# Patient Record
Sex: Male | Born: 1945 | Race: White | Hispanic: No | Marital: Married | Smoking: Never smoker
Health system: Southern US, Community
[De-identification: ages and names within clinical notes are randomized; demographics above are authoritative.]

## PROBLEM LIST (undated history)

## (undated) DIAGNOSIS — E119 Type 2 diabetes mellitus without complications: Secondary | ICD-10-CM

## (undated) DIAGNOSIS — I10 Essential (primary) hypertension: Secondary | ICD-10-CM

## (undated) DIAGNOSIS — I4891 Unspecified atrial fibrillation: Secondary | ICD-10-CM

## (undated) DIAGNOSIS — G4730 Sleep apnea, unspecified: Secondary | ICD-10-CM

## (undated) DIAGNOSIS — N419 Inflammatory disease of prostate, unspecified: Secondary | ICD-10-CM

## (undated) DIAGNOSIS — G4733 Obstructive sleep apnea (adult) (pediatric): Secondary | ICD-10-CM

## (undated) DIAGNOSIS — K635 Polyp of colon: Secondary | ICD-10-CM

## (undated) DIAGNOSIS — Z9989 Dependence on other enabling machines and devices: Secondary | ICD-10-CM

## (undated) DIAGNOSIS — K219 Gastro-esophageal reflux disease without esophagitis: Secondary | ICD-10-CM

## (undated) DIAGNOSIS — E785 Hyperlipidemia, unspecified: Secondary | ICD-10-CM

## (undated) HISTORY — DX: Essential (primary) hypertension: I10

## (undated) HISTORY — DX: Type 2 diabetes mellitus without complications: E11.9

## (undated) HISTORY — DX: Unspecified atrial fibrillation: I48.91

## (undated) HISTORY — DX: Sleep apnea, unspecified: G47.30

## (undated) HISTORY — DX: Inflammatory disease of prostate, unspecified: N41.9

## (undated) HISTORY — DX: Gastro-esophageal reflux disease without esophagitis: K21.9

## (undated) HISTORY — DX: Dependence on other enabling machines and devices: Z99.89

## (undated) HISTORY — DX: Obstructive sleep apnea (adult) (pediatric): G47.33

## (undated) HISTORY — DX: Hyperlipidemia, unspecified: E78.5

## (undated) HISTORY — DX: Polyp of colon: K63.5

---

## 1973-06-28 HISTORY — PX: HERNIA REPAIR: SHX51

## 1998-06-13 ENCOUNTER — Other Ambulatory Visit: Admission: RE | Admit: 1998-06-13 | Discharge: 1998-06-13 | Payer: Self-pay | Admitting: Unknown Physician Specialty

## 2004-05-04 ENCOUNTER — Ambulatory Visit: Payer: Self-pay | Admitting: Internal Medicine

## 2004-09-08 ENCOUNTER — Ambulatory Visit: Payer: Self-pay | Admitting: Endocrinology

## 2004-09-14 ENCOUNTER — Ambulatory Visit: Payer: Self-pay | Admitting: Internal Medicine

## 2004-10-26 ENCOUNTER — Ambulatory Visit: Payer: Self-pay | Admitting: Internal Medicine

## 2004-11-02 ENCOUNTER — Ambulatory Visit: Payer: Self-pay | Admitting: Internal Medicine

## 2005-03-25 ENCOUNTER — Ambulatory Visit: Payer: Self-pay | Admitting: Internal Medicine

## 2005-03-30 ENCOUNTER — Ambulatory Visit: Payer: Self-pay | Admitting: Internal Medicine

## 2005-08-02 ENCOUNTER — Ambulatory Visit: Payer: Self-pay | Admitting: Internal Medicine

## 2005-11-05 ENCOUNTER — Ambulatory Visit: Payer: Self-pay | Admitting: Internal Medicine

## 2005-11-26 ENCOUNTER — Ambulatory Visit: Payer: Self-pay | Admitting: Internal Medicine

## 2006-05-03 ENCOUNTER — Ambulatory Visit: Payer: Self-pay | Admitting: Internal Medicine

## 2006-05-03 LAB — CONVERTED CEMR LAB
Albumin: 3.9 g/dL (ref 3.5–5.2)
Alkaline Phosphatase: 107 units/L (ref 39–117)
Basophils Absolute: 0 10*3/uL (ref 0.0–0.1)
CO2: 34 meq/L — ABNORMAL HIGH (ref 19–32)
Glomerular Filtration Rate, Af Am: 73 mL/min/{1.73_m2}
Glucose, Bld: 109 mg/dL — ABNORMAL HIGH (ref 70–99)
Ketones, ur: NEGATIVE mg/dL
LDL Cholesterol: 102 mg/dL — ABNORMAL HIGH (ref 0–99)
Lymphocytes Relative: 20.7 % (ref 12.0–46.0)
Monocytes Relative: 7.7 % (ref 3.0–11.0)
Nitrite: NEGATIVE
Platelets: 236 10*3/uL (ref 150–400)
Potassium: 4 meq/L (ref 3.5–5.1)
Sodium: 141 meq/L (ref 135–145)
Specific Gravity, Urine: 1.025 (ref 1.000–1.03)
TSH: 2.33 microintl units/mL (ref 0.35–5.50)
Total Bilirubin: 0.8 mg/dL (ref 0.3–1.2)
Total Protein: 7 g/dL (ref 6.0–8.3)
Urobilinogen, UA: 0.2 (ref 0.0–1.0)
VLDL: 13 mg/dL (ref 0–40)
pH: 5.5 (ref 5.0–8.0)

## 2006-05-11 ENCOUNTER — Ambulatory Visit: Payer: Self-pay | Admitting: Internal Medicine

## 2006-06-28 HISTORY — PX: ROTATOR CUFF REPAIR: SHX139

## 2007-03-27 ENCOUNTER — Ambulatory Visit: Payer: Self-pay | Admitting: Internal Medicine

## 2007-04-22 ENCOUNTER — Encounter: Payer: Self-pay | Admitting: Internal Medicine

## 2007-04-22 DIAGNOSIS — I10 Essential (primary) hypertension: Secondary | ICD-10-CM

## 2007-04-22 DIAGNOSIS — E785 Hyperlipidemia, unspecified: Secondary | ICD-10-CM

## 2007-04-22 DIAGNOSIS — Z86010 Personal history of colonic polyps: Secondary | ICD-10-CM

## 2007-05-16 ENCOUNTER — Ambulatory Visit: Payer: Self-pay | Admitting: Internal Medicine

## 2007-05-16 LAB — CONVERTED CEMR LAB
AST: 39 units/L — ABNORMAL HIGH (ref 0–37)
Basophils Relative: 0.8 % (ref 0.0–1.0)
Bilirubin, Direct: 0.2 mg/dL (ref 0.0–0.3)
Chloride: 100 meq/L (ref 96–112)
Cholesterol: 183 mg/dL (ref 0–200)
Creatinine, Ser: 1.2 mg/dL (ref 0.4–1.5)
Eosinophils Relative: 2.1 % (ref 0.0–5.0)
Glucose, Bld: 113 mg/dL — ABNORMAL HIGH (ref 70–99)
HCT: 43.2 % (ref 39.0–52.0)
Ketones, ur: NEGATIVE mg/dL
LDL Cholesterol: 102 mg/dL — ABNORMAL HIGH (ref 0–99)
Neutrophils Relative %: 66.7 % (ref 43.0–77.0)
Nitrite: NEGATIVE
PSA: 1.94 ng/mL (ref 0.10–4.00)
RBC: 4.92 M/uL (ref 4.22–5.81)
RDW: 13.2 % (ref 11.5–14.6)
Sodium: 142 meq/L (ref 135–145)
Specific Gravity, Urine: >=1.03 (ref 1.000–1.03)
Total Bilirubin: 1.3 mg/dL — ABNORMAL HIGH (ref 0.3–1.2)
Urobilinogen, UA: 0.2 (ref 0.0–1.0)
VLDL: 28 mg/dL (ref 0–40)
WBC: 5.5 10*3/uL (ref 4.5–10.5)
pH: 6 (ref 5.0–8.0)

## 2007-05-23 ENCOUNTER — Ambulatory Visit: Payer: Self-pay | Admitting: Internal Medicine

## 2007-05-23 DIAGNOSIS — E876 Hypokalemia: Secondary | ICD-10-CM | POA: Insufficient documentation

## 2007-05-23 DIAGNOSIS — N529 Male erectile dysfunction, unspecified: Secondary | ICD-10-CM | POA: Insufficient documentation

## 2007-05-23 DIAGNOSIS — H60399 Other infective otitis externa, unspecified ear: Secondary | ICD-10-CM | POA: Insufficient documentation

## 2007-09-05 ENCOUNTER — Ambulatory Visit: Payer: Self-pay | Admitting: Gastroenterology

## 2007-09-13 ENCOUNTER — Ambulatory Visit: Payer: Self-pay | Admitting: Gastroenterology

## 2007-09-21 ENCOUNTER — Ambulatory Visit: Payer: Self-pay | Admitting: Pulmonary Disease

## 2007-09-21 DIAGNOSIS — G4733 Obstructive sleep apnea (adult) (pediatric): Secondary | ICD-10-CM | POA: Insufficient documentation

## 2007-09-26 ENCOUNTER — Ambulatory Visit: Payer: Self-pay | Admitting: Internal Medicine

## 2007-09-26 DIAGNOSIS — K219 Gastro-esophageal reflux disease without esophagitis: Secondary | ICD-10-CM | POA: Insufficient documentation

## 2007-10-04 ENCOUNTER — Ambulatory Visit: Payer: Self-pay | Admitting: Pulmonary Disease

## 2007-10-04 ENCOUNTER — Ambulatory Visit (HOSPITAL_BASED_OUTPATIENT_CLINIC_OR_DEPARTMENT_OTHER): Admission: RE | Admit: 2007-10-04 | Discharge: 2007-10-04 | Payer: Self-pay | Admitting: Pulmonary Disease

## 2007-10-12 ENCOUNTER — Telehealth (INDEPENDENT_AMBULATORY_CARE_PROVIDER_SITE_OTHER): Payer: Self-pay | Admitting: *Deleted

## 2007-10-24 ENCOUNTER — Ambulatory Visit: Payer: Self-pay | Admitting: Pulmonary Disease

## 2007-11-13 ENCOUNTER — Ambulatory Visit: Payer: Self-pay | Admitting: Internal Medicine

## 2007-11-15 LAB — CONVERTED CEMR LAB
BUN: 14 mg/dL (ref 6–23)
Chloride: 103 meq/L (ref 96–112)
Creatinine, Ser: 1.1 mg/dL (ref 0.4–1.5)
GFR calc non Af Amer: 72 mL/min
Glucose, Bld: 112 mg/dL — ABNORMAL HIGH (ref 70–99)

## 2007-11-16 ENCOUNTER — Ambulatory Visit: Payer: Self-pay | Admitting: Internal Medicine

## 2007-11-21 ENCOUNTER — Ambulatory Visit: Payer: Self-pay | Admitting: Pulmonary Disease

## 2007-12-18 ENCOUNTER — Encounter: Payer: Self-pay | Admitting: Pulmonary Disease

## 2008-05-08 ENCOUNTER — Ambulatory Visit: Payer: Self-pay | Admitting: Internal Medicine

## 2008-05-08 LAB — CONVERTED CEMR LAB
BUN: 16 mg/dL (ref 6–23)
CO2: 34 meq/L — ABNORMAL HIGH (ref 19–32)
Calcium: 9.1 mg/dL (ref 8.4–10.5)
Creatinine, Ser: 1.1 mg/dL (ref 0.4–1.5)

## 2008-05-14 ENCOUNTER — Ambulatory Visit: Payer: Self-pay | Admitting: Internal Medicine

## 2008-05-20 ENCOUNTER — Ambulatory Visit: Payer: Self-pay | Admitting: Pulmonary Disease

## 2008-07-15 ENCOUNTER — Encounter: Payer: Self-pay | Admitting: Internal Medicine

## 2008-11-08 ENCOUNTER — Ambulatory Visit: Payer: Self-pay | Admitting: Internal Medicine

## 2008-11-08 LAB — CONVERTED CEMR LAB
ALT: 51 units/L (ref 0–53)
AST: 38 units/L — ABNORMAL HIGH (ref 0–37)
BUN: 14 mg/dL (ref 6–23)
Basophils Relative: 0.5 % (ref 0.0–3.0)
Bilirubin Urine: NEGATIVE
Calcium: 9.3 mg/dL (ref 8.4–10.5)
Creatinine, Ser: 1.1 mg/dL (ref 0.4–1.5)
Eosinophils Relative: 1.7 % (ref 0.0–5.0)
GFR calc non Af Amer: 72.09 mL/min (ref >60)
Glucose, Bld: 108 mg/dL — ABNORMAL HIGH (ref 70–99)
HCT: 41.6 % (ref 39.0–52.0)
HDL: 52.5 mg/dL (ref >39.00)
Hemoglobin: 14.4 g/dL (ref 13.0–17.0)
Ketones, ur: NEGATIVE mg/dL
Leukocytes, UA: NEGATIVE
Lymphs Abs: 1.5 10*3/uL (ref 0.7–4.0)
MCV: 88.6 fL (ref 78.0–100.0)
Monocytes Absolute: 0.5 10*3/uL (ref 0.1–1.0)
Monocytes Relative: 9.5 % (ref 3.0–12.0)
Neutro Abs: 3.5 10*3/uL (ref 1.4–7.7)
PSA: 1.52 ng/mL (ref 0.10–4.00)
RBC: 4.7 M/uL (ref 4.22–5.81)
Sodium: 143 meq/L (ref 135–145)
Specific Gravity, Urine: 1.015 (ref 1.000–1.030)
TSH: 3.1 microintl units/mL (ref 0.35–5.50)
Total CHOL/HDL Ratio: 3
Total Protein: 6.9 g/dL (ref 6.0–8.3)
Urine Glucose: NEGATIVE mg/dL
Urobilinogen, UA: 1 (ref 0.0–1.0)
WBC: 5.6 10*3/uL (ref 4.5–10.5)

## 2008-11-14 ENCOUNTER — Ambulatory Visit: Payer: Self-pay | Admitting: Internal Medicine

## 2009-05-14 ENCOUNTER — Ambulatory Visit: Payer: Self-pay | Admitting: Internal Medicine

## 2009-05-14 LAB — CONVERTED CEMR LAB
BUN: 15 mg/dL (ref 6–23)
Chloride: 103 meq/L (ref 96–112)
Creatinine, Ser: 1.2 mg/dL (ref 0.4–1.5)
GFR calc non Af Amer: 65.09 mL/min (ref >60)
Glucose, Bld: 105 mg/dL — ABNORMAL HIGH (ref 70–99)
Potassium: 4.2 meq/L (ref 3.5–5.1)

## 2009-05-19 ENCOUNTER — Ambulatory Visit: Payer: Self-pay | Admitting: Internal Medicine

## 2009-05-19 ENCOUNTER — Ambulatory Visit: Payer: Self-pay | Admitting: Pulmonary Disease

## 2009-05-19 DIAGNOSIS — B079 Viral wart, unspecified: Secondary | ICD-10-CM | POA: Insufficient documentation

## 2009-09-02 ENCOUNTER — Ambulatory Visit: Payer: Self-pay | Admitting: Internal Medicine

## 2009-09-02 DIAGNOSIS — J40 Bronchitis, not specified as acute or chronic: Secondary | ICD-10-CM

## 2009-11-13 ENCOUNTER — Ambulatory Visit: Payer: Self-pay | Admitting: Internal Medicine

## 2009-11-17 LAB — CONVERTED CEMR LAB
ALT: 37 units/L (ref 0–53)
AST: 28 units/L (ref 0–37)
Albumin: 4.1 g/dL (ref 3.5–5.2)
Basophils Relative: 0.5 % (ref 0.0–3.0)
Bilirubin Urine: NEGATIVE
CO2: 34 meq/L — ABNORMAL HIGH (ref 19–32)
Calcium: 8.8 mg/dL (ref 8.4–10.5)
Creatinine, Ser: 1.1 mg/dL (ref 0.4–1.5)
Eosinophils Relative: 1.9 % (ref 0.0–5.0)
GFR calc non Af Amer: 73.39 mL/min (ref >60)
Glucose, Bld: 92 mg/dL (ref 70–99)
HCT: 41.7 % (ref 39.0–52.0)
Hemoglobin: 14.2 g/dL (ref 13.0–17.0)
Ketones, ur: NEGATIVE mg/dL
Lymphs Abs: 1.3 10*3/uL (ref 0.7–4.0)
MCV: 90.4 fL (ref 78.0–100.0)
Monocytes Absolute: 0.5 10*3/uL (ref 0.1–1.0)
Monocytes Relative: 7.1 % (ref 3.0–12.0)
Neutro Abs: 4.4 10*3/uL (ref 1.4–7.7)
RBC: 4.61 M/uL (ref 4.22–5.81)
Sodium: 140 meq/L (ref 135–145)
Specific Gravity, Urine: 1.02 (ref 1.000–1.030)
TSH: 3.1 microintl units/mL (ref 0.35–5.50)
Total Bilirubin: 1 mg/dL (ref 0.3–1.2)
Total CHOL/HDL Ratio: 3
Total Protein: 6.9 g/dL (ref 6.0–8.3)
Urine Glucose: NEGATIVE mg/dL
Urobilinogen, UA: 1 (ref 0.0–1.0)
WBC: 6.3 10*3/uL (ref 4.5–10.5)

## 2009-11-18 ENCOUNTER — Ambulatory Visit: Payer: Self-pay | Admitting: Internal Medicine

## 2009-11-18 DIAGNOSIS — R972 Elevated prostate specific antigen [PSA]: Secondary | ICD-10-CM

## 2009-11-18 DIAGNOSIS — N410 Acute prostatitis: Secondary | ICD-10-CM

## 2010-02-02 ENCOUNTER — Ambulatory Visit: Payer: Self-pay | Admitting: Internal Medicine

## 2010-02-03 LAB — CONVERTED CEMR LAB
Hemoglobin, Urine: NEGATIVE
Nitrite: NEGATIVE
Specific Gravity, Urine: >=1.03 (ref 1.000–1.030)
Total Protein, Urine: NEGATIVE mg/dL
Urobilinogen, UA: 0.2 (ref 0.0–1.0)

## 2010-02-05 LAB — CONVERTED CEMR LAB: PSA: 1.68 ng/mL (ref 0.10–4.00)

## 2010-05-14 ENCOUNTER — Ambulatory Visit: Payer: Self-pay | Admitting: Internal Medicine

## 2010-05-14 DIAGNOSIS — M79609 Pain in unspecified limb: Secondary | ICD-10-CM | POA: Insufficient documentation

## 2010-05-14 DIAGNOSIS — R21 Rash and other nonspecific skin eruption: Secondary | ICD-10-CM

## 2010-05-18 ENCOUNTER — Ambulatory Visit: Payer: Self-pay | Admitting: Pulmonary Disease

## 2010-06-28 HISTORY — PX: APPENDECTOMY: SHX54

## 2010-07-28 NOTE — Assessment & Plan Note (Signed)
Summary: CPX / NWS #   Vital Signs:  Patient profile:   65 year old male Height:      66 inches Weight:      192.75 pounds BMI:     31.22 O2 Sat:      90 % on Room air Temp:     98.4 degrees F oral Pulse rate:   70 / minute BP sitting:   120 / 80  (left arm) Cuff size:   regular  Vitals Entered ByZella Ball Ewing (Nov 18, 2009 8:23 AM)  O2 Flow:  Room air CC: Discuss Labs/RE   Primary Care Provider:  Plotnikov  CC:  Discuss Labs/RE.  History of Present Illness: The patient presents for a wellness examination   Current Medications (verified): 1)  Lipitor 40 Mg  Tabs (Atorvastatin Calcium) .... Take 1 Tab By Mouth Once Daily 2)  Hyzaar 100-25 Mg  Tabs (Losartan Potassium-Hctz) .... Once Daily 3)  Klor-Con M20 20 Meq  Tbcr (Potassium Chloride Crys Cr) .Marland Kitchen.. 1 By Mouth Qd 4)  Levitra 20 Mg Tabs (Vardenafil Hcl) .... 1/2-1 Once Daily As Needed 5)  Baby Aspirin 81 Mg  Chew (Aspirin) .... Once Daily 6)  Vitamin D3 1000 Unit  Tabs (Cholecalciferol) .Marland Kitchen.. 1 Qd 7)  Tussionex Pennkinetic Er 8-10 Mg/71ml Lqcr (Chlorpheniramine-Hydrocodone) .... 5cc By Mouth Every 12hours As Needed For Cough  Allergies (verified): 1)  Lotensin (Benazepril Hcl)  Past History:  Family History: Last updated: 09/26/2007 Family History of CAD - mother Family History Hypertension sister with cranial anuerysm/stroke son with asthma  Social History: Last updated: 11/14/2008 Occupation: Airline pilot Married Never Smoked Regular exercise-yes, gym Alcohol use-no 3 children  Past Medical History: Colonic polyps, hx of Dr Jarold Motto Hyperlipidemia Hypertension GERD OSA on CPAP  Past Surgical History: colonosc  12/99, 2007 L root cuff surgery 2/08 Dr Wyline Mood  Review of Systems  The patient denies anorexia, fever, weight loss, weight gain, vision loss, decreased hearing, hoarseness, chest pain, syncope, dyspnea on exertion, peripheral edema, prolonged cough, headaches, hemoptysis, abdominal pain,  melena, hematochezia, severe indigestion/heartburn, hematuria, incontinence, genital sores, muscle weakness, suspicious skin lesions, transient blindness, difficulty walking, depression, unusual weight change, abnormal bleeding, enlarged lymph nodes, angioedema, and testicular masses.         He just felt bad x 2 days 1-2 wks ago  Physical Exam  General:  alert, well-developed, well-nourished, and cooperative to examination.    Head:  normocephalic.   Eyes:  vision grossly intact; pupils equal, round and reactive to light.  conjunctiva and lids normal.    Ears:  normal pinnae bilaterally, without erythema, swelling, or tenderness to palpation. TMs clear, without effusion, or cerumen impaction. Hearing grossly normal bilaterally  Nose:  no external deformity.   Mouth:  teeth and gums in good repair; mucous membranes moist, without lesions or ulcers. oropharynx clear without exudate, min erythema.  Neck:  No deformities, masses, or tenderness noted. Lungs:  normal respiratory effort, no intercostal retractions or use of accessory muscles; few rhonchorousl breath sounds bilaterally but good air mvmt- no crackles and no wheezes.    Heart:  normal rate, regular rhythm, no murmur, and no rub. BLE without edema.  Abdomen:  soft, non-tender, no hepatomegaly, and no splenomegaly.   Rectal:  No external abnormalities noted. Normal sphincter tone. No rectal masses or tenderness. Genitalia:  Testes bilaterally descended without nodularity, tenderness or masses. No scrotal masses or lesions. No penis lesions or urethral discharge. Prostate:  1+ enlarged.  NT Msk:  No deformity or scoliosis noted of thoracic or lumbar spine.   Pulses:  R and L carotid,radial,femoral,dorsalis pedis and posterior tibial pulses are full and equal bilaterally Extremities:  No clubbing, cyanosis, edema, or deformity noted with normal full range of motion of all joints.   Neurologic:  alert & oriented X3.   Skin:  WNL Cervical  Nodes:  No lymphadenopathy noted Inguinal Nodes:  No significant adenopathy Psych:  Cognition and judgment appear intact. Alert and cooperative with normal attention span and concentration. No apparent delusions, illusions, hallucinations   Impression & Recommendations:  Problem # 1:  WELL ADULT EXAM (ICD-V70.0) Assessment New The labs were reviewed with the patient.  Health and age related issues were discussed. Available screening tests and vaccinations were discussed as well. Healthy life style including good diet and execise was discussed. Zostavax in 3 months   Problem # 2:  PROSTATITIS, ACUTE (ICD-601.0) Assessment: New Cipro x 2 wks  Problem # 3:  PSA, INCREASED (ICD-790.93) due to #2 Assessment: New See "Patient Instructions".   Complete Medication List: 1)  Lipitor 40 Mg Tabs (Atorvastatin calcium) .... Take 1 tab by mouth once daily 2)  Hyzaar 100-25 Mg Tabs (Losartan potassium-hctz) .... Once daily 3)  Klor-con M20 20 Meq Tbcr (Potassium chloride crys cr) .Marland Kitchen.. 1 by mouth qd 4)  Levitra 20 Mg Tabs (Vardenafil hcl) .... 1/2-1 once daily as needed 5)  Baby Aspirin 81 Mg Chew (Aspirin) .... Once daily 6)  Vitamin D3 1000 Unit Tabs (Cholecalciferol) .Marland Kitchen.. 1 qd 7)  Tussionex Pennkinetic Er 8-10 Mg/35ml Lqcr (Chlorpheniramine-hydrocodone) .... 5cc by mouth every 12hours as needed for cough 8)  Ciprofloxacin Hcl 500 Mg Tabs (Ciprofloxacin hcl) .Marland Kitchen.. 1 by mouth bid  Patient Instructions: 1)  Please schedule a follow-up appointment in 3 months. 2)  Urine-dip prior to visit, ICD-9: 3)  PSA prior to visit, ICD-9: 4)  Free PSA 790.93 Prescriptions: CIPROFLOXACIN HCL 500 MG TABS (CIPROFLOXACIN HCL) 1 by mouth bid  #28 x 1   Entered and Authorized by:   Tresa Garter MD   Signed by:   Tresa Garter MD on 11/18/2009   Method used:   Electronically to        [facility]       [address]       [place], [place]   [address]       Ph: [phone]       Fax: [phone]   RxID:   [id number]

## 2010-07-28 NOTE — Assessment & Plan Note (Signed)
Summary: rov for osa   Visit Type:  Follow-up Copy to:  Sheryn Bison Primary Akeira Lahm/Referring Carra Brindley:  Plotnikov  CC:  1 year f/u. pt states he uses cpap eveyrnight x 7-8 hrs a night. Pt states he has no concerns or complaints today. Marland Kitchen  History of Present Illness: the pt comes in today for f/u of his osa.  He has been wearing cpap nightly, and has no issues with tolerance.  He is sleeping well, and denies EDS.  He has lost 5 pounds since the last visit.  He is keeping up with supplies.  Current Medications (verified): 1)  Lipitor 40 Mg  Tabs (Atorvastatin Calcium) .... Take 1 Tab By Mouth Once Daily 2)  Hyzaar 100-25 Mg  Tabs (Losartan Potassium-Hctz) .... Once Daily 3)  Klor-Con M20 20 Meq  Tbcr (Potassium Chloride Crys Cr) .Marland Kitchen.. 1 By Mouth Qd 4)  Levitra 20 Mg Tabs (Vardenafil Hcl) .... 1/2-1 Once Daily As Needed 5)  Baby Aspirin 81 Mg  Chew (Aspirin) .... Once Daily 6)  Vitamin D3 1000 Unit  Tabs (Cholecalciferol) .Marland Kitchen.. 1 Qd 7)  Pennsaid 1.5 % Soln (Diclofenac Sodium) .... 3-5 Gtt On Skin Three Times A Day For Pain  Allergies (verified): 1)  Lotensin (Benazepril Hcl)  Review of Systems  The patient denies shortness of breath with activity, shortness of breath at rest, productive cough, non-productive cough, coughing up blood, chest pain, irregular heartbeats, acid heartburn, indigestion, loss of appetite, weight change, abdominal pain, difficulty swallowing, sore throat, tooth/dental problems, headaches, nasal congestion/difficulty breathing through nose, sneezing, itching, ear ache, anxiety, depression, joint stiffness or pain, rash, change in color of mucus, and fever.    Vital Signs:  Patient profile:   65 year old male Height:      66 inches Weight:      195 pounds O2 Sat:      92 % on Room air Temp:     98.2 degrees F oral Pulse rate:   88 / minute BP sitting:   112 / 80  (left arm) Cuff size:   regular  Vitals Entered By: Carver Fila (May 18, 2010 8:50  AM)  O2 Flow:  Room air CC: 1 year f/u. pt states he uses cpap eveyrnight x 7-8 hrs a night. Pt states he has no concerns or complaints today.  Comments meds and allergies updated Phone number updated Carver Fila  May 18, 2010 8:51 AM    Physical Exam  General:  ow male in nad Nose:  no skin breakdown or pressure necrosis from cpap mask Extremities:  no edema or cyanosis Neurologic:  alert and oriented, moves all 4.   Impression & Recommendations:  Problem # 1:  OBSTRUCTIVE SLEEP APNEA (ICD-327.23)  the pt is doing well with cpap.  He is wearing compliantly, and having no issues with mask fit or pressure.  He is satisfied with his sleep and daytime alertness.  He has lost some weight, but I have encouraged him to continue.  If he continues to do well, will see him back in one year.  Other Orders: Est. Patient Level III ([address])  Patient Instructions: 1)  continue with cpap 2)  work on further weight loss 3)  followup with me in one year

## 2010-07-28 NOTE — Assessment & Plan Note (Signed)
Summary: fever 101/cough/plot pt/cd    Vital Signs:  Patient profile:   65 year old male Height:      68 inches (172.72 cm) Weight:      195.12 pounds (88.69 kg) O2 Sat:      95 % on Room air Temp:     99.0 degrees F (37.22 degrees C) oral Pulse rate:   62 / minute BP sitting:   142 / 92  (left arm) Cuff size:   regular  Vitals Entered By: Orlan Leavens (September 02, 2009 10:31 AM)  O2 Flow:  Room air CC: Cough/ fever, URI symptoms Is Patient Diabetic? No Pain Assessment Patient in pain? no        Primary Care Provider:  Plotnikov  CC:  Cough/ fever and URI symptoms.  History of Present Illness:  URI Symptoms      This is a 65 year old man who presents with URI symptoms.  The symptoms began 2 days ago.  The severity is described as moderate.  The patient reports sore throat, productive cough, and sick contacts, but denies nasal congestion and earache.  Associated symptoms include fever of 100.5-103 degrees and wheezing.  The patient denies dyspnea, vomiting, and diarrhea.  The patient denies sneezing, seasonal symptoms, and headache.    Current Medications (verified): 1)  Lipitor 40 Mg  Tabs (Atorvastatin Calcium) .... Take 1 Tab By Mouth Once Daily 2)  Hyzaar 100-25 Mg  Tabs (Losartan Potassium-Hctz) .... Once Daily 3)  Klor-Con M20 20 Meq  Tbcr (Potassium Chloride Crys Cr) .Marland Kitchen.. 1 By Mouth Qd 4)  Levitra 20 Mg Tabs (Vardenafil Hcl) .... 1/2-1 Once Daily As Needed 5)  Baby Aspirin 81 Mg  Chew (Aspirin) .... Once Daily 6)  Vitamin D3 1000 Unit  Tabs (Cholecalciferol) .Marland Kitchen.. 1 Qd  Allergies (verified): 1)  Lotensin (Benazepril Hcl)  Past History:  Past Medical History: Colonic polyps, hx of Hyperlipidemia Hypertension GERD OSA on CPAP  Review of Systems  The patient denies anorexia, hoarseness, headaches, and abdominal pain.    Physical Exam  General:  alert, well-developed, well-nourished, and cooperative to examination.    Eyes:  vision grossly intact; pupils  equal, round and reactive to light.  conjunctiva and lids normal.    Ears:  normal pinnae bilaterally, without erythema, swelling, or tenderness to palpation. TMs clear, without effusion, or cerumen impaction. Hearing grossly normal bilaterally  Mouth:  teeth and gums in good repair; mucous membranes moist, without lesions or ulcers. oropharynx clear without exudate, min erythema.  Lungs:  normal respiratory effort, no intercostal retractions or use of accessory muscles; few rhonchorousl breath sounds bilaterally but good air mvmt- no crackles and no wheezes.    Heart:  normal rate, regular rhythm, no murmur, and no rub. BLE without edema.    Impression & Recommendations:  Problem # 1:  BRONCHITIS, MILD (ICD-490)  His updated medication list for this problem includes:    Clarithromycin 500 Mg Tabs (Clarithromycin) .Marland Kitchen... 1 by mouth two times a day x 7days    Tussionex Pennkinetic Er 8-10 Mg/17ml Lqcr (Chlorpheniramine-hydrocodone) .Marland Kitchen... 5cc by mouth every 12hours as needed for cough  Take antibiotics and other medications as directed. Encouraged to push clear liquids, get enough rest, and take acetaminophen as needed. To be seen in 5-7 days if no improvement, sooner if worse.  Complete Medication List: 1)  Lipitor 40 Mg Tabs (Atorvastatin calcium) .... Take 1 tab by mouth once daily 2)  Hyzaar 100-25 Mg Tabs (Losartan potassium-hctz) .Marland KitchenMarland KitchenMarland Kitchen  Once daily 3)  Klor-con M20 20 Meq Tbcr (Potassium chloride crys cr) .Marland Kitchen.. 1 by mouth qd 4)  Levitra 20 Mg Tabs (Vardenafil hcl) .... 1/2-1 once daily as needed 5)  Baby Aspirin 81 Mg Chew (Aspirin) .... Once daily 6)  Vitamin D3 1000 Unit Tabs (Cholecalciferol) .Marland Kitchen.. 1 qd 7)  Clarithromycin 500 Mg Tabs (Clarithromycin) .Marland Kitchen.. 1 by mouth two times a day x 7days 8)  Tussionex Pennkinetic Er 8-10 Mg/73ml Lqcr (Chlorpheniramine-hydrocodone) .... 5cc by mouth every 12hours as needed for cough  Patient Instructions: 1)  it was good to see you today. 2)  antibitoics  - Biaxin (generic) and cough meds as discussed - Please take as directed. Contact our office if you believe you're having problems with the medication(s).  3)  Get plenty of rest, drink lots of clear liquids, and use Tylenol or Ibuprofen for fever and comfort. Return in 7-10 days if you're not better:sooner if you're feeling worse. Prescriptions: Sandria Senter ER 8-10 MG/5ML LQCR (CHLORPHENIRAMINE-HYDROCODONE) 5cc by mouth every 12hours as needed for cough  #6 oz x 0   Entered and Authorized by:   Newt Lukes MD   Signed by:   Newt Lukes MD on 09/02/2009   Method used:   Print then Give to Patient   RxID:   [id number] CLARITHROMYCIN 500 MG TABS (CLARITHROMYCIN) 1 by mouth two times a day x 7days  #14 x 0   Entered and Authorized by:   Newt Lukes MD   Signed by:   Newt Lukes MD on 09/02/2009   Method used:   Print then Give to Patient   RxID:   [id number]

## 2010-07-28 NOTE — Assessment & Plan Note (Signed)
Summary: 3 MO ROV /NWS  #    Vital Signs:  Patient profile:   65 year old male Height:      66 inches Weight:      193 pounds BMI:     31.26 Temp:     98.5 degrees F oral Pulse rate:   72 / minute Pulse rhythm:   regular Resp:     16 per minute BP sitting:   120 / 80  (left arm) Cuff size:   regular  Vitals Entered By: Lanier Prude, CMA(AAMA) (May 14, 2010 8:24 AM) CC: 3 mo f/u  c/o skin lesions on Rt arm Is Patient Diabetic? No   Primary Care Provider:  Lakiyah Arntson  CC:  3 mo f/u  c/o skin lesions on Rt arm.  History of Present Illness: F/u HTN and prostatitis C/o elbows and R forearm rash x years  Current Medications (verified): 1)  Lipitor 40 Mg  Tabs (Atorvastatin Calcium) .... Take 1 Tab By Mouth Once Daily 2)  Hyzaar 100-25 Mg  Tabs (Losartan Potassium-Hctz) .... Once Daily 3)  Klor-Con M20 20 Meq  Tbcr (Potassium Chloride Crys Cr) .Marland Kitchen.. 1 By Mouth Qd 4)  Levitra 20 Mg Tabs (Vardenafil Hcl) .... 1/2-1 Once Daily As Needed 5)  Baby Aspirin 81 Mg  Chew (Aspirin) .... Once Daily 6)  Vitamin D3 1000 Unit  Tabs (Cholecalciferol) .Marland Kitchen.. 1 Qd 7)  Tussionex Pennkinetic Er 8-10 Mg/46ml Lqcr (Chlorpheniramine-Hydrocodone) .... 5cc By Mouth Every 12hours As Needed For Cough  Allergies (verified): 1)  Lotensin (Benazepril Hcl)  Past History:  Past Medical History: Colonic polyps, hx of Dr Jarold Motto Hyperlipidemia Hypertension GERD OSA on CPAP Prostatitis  Physical Exam  General:  alert, well-developed, well-nourished, and cooperative to examination.    Mouth:  teeth and gums in good repair; mucous membranes moist, without lesions or ulcers. oropharynx clear without exudate, min erythema.  Abdomen:  soft, non-tender, no hepatomegaly, and no splenomegaly.   Prostate:  1+ enlarged.  NT Msk:  L med bursa ancerina and down is tender Extremities:  No clubbing, cyanosis, edema, or deformity noted with normal full range of motion of all joints.   Neurologic:  alert &  oriented X3.   Skin:  3-5 mm wart like lesions on B elbows and forearms R>L Psych:  Cognition and judgment appear intact. Alert and cooperative with normal attention span and concentration. No apparent delusions, illusions, hallucinations   Impression & Recommendations:  Problem # 1:  SKIN RASH (ICD-782.1) - poss. lychen vs other Assessment Unchanged Bx suggested  Problem # 2:  LEG PAIN (ICD-729.5) L - ligament strain Assessment: New New shoes Ice  Problem # 3:  HYPERTENSION (ICD-401.9) Assessment: Unchanged  His updated medication list for this problem includes:    Hyzaar 100-25 Mg Tabs (Losartan potassium-hctz) ..... Once daily  Problem # 4:  HYPERLIPIDEMIA (ICD-272.4) Assessment: Unchanged  His updated medication list for this problem includes:    Lipitor 40 Mg Tabs (Atorvastatin calcium) .Marland Kitchen... Take 1 tab by mouth once daily  Complete Medication List: 1)  Lipitor 40 Mg Tabs (Atorvastatin calcium) .... Take 1 tab by mouth once daily 2)  Hyzaar 100-25 Mg Tabs (Losartan potassium-hctz) .... Once daily 3)  Klor-con M20 20 Meq Tbcr (Potassium chloride crys cr) .Marland Kitchen.. 1 by mouth qd 4)  Levitra 20 Mg Tabs (Vardenafil hcl) .... 1/2-1 once daily as needed 5)  Baby Aspirin 81 Mg Chew (Aspirin) .... Once daily 6)  Vitamin D3 1000 Unit Tabs (Cholecalciferol) .Marland KitchenMarland KitchenMarland Kitchen 1  qd 7)  Pennsaid 1.5 % Soln (Diclofenac sodium) .... 3-5 gtt on skin three times a day for pain  Other Orders: Admin 1st Vaccine (70623) Flu Vaccine 16yrs + (76283)  Patient Instructions: 1)  Please schedule a follow-up appointment in 6 months well w/labs. Prescriptions: LEVITRA 20 MG TABS (VARDENAFIL HCL) 1/2-1 once daily as needed  #12 x 4   Entered and Authorized by:   Tresa Garter MD   Signed by:   Tresa Garter MD on 05/14/2010   Method used:   Print then Give to Patient   RxID:   [id number] KLOR-CON M20 20 MEQ  TBCR (POTASSIUM CHLORIDE CRYS CR) 1 by mouth qd  #90 x 3   Entered and Authorized  by:   Tresa Garter MD   Signed by:   Tresa Garter MD on 05/14/2010   Method used:   Print then Give to Patient   RxID:   [id number] HYZAAR 100-25 MG  TABS (LOSARTAN POTASSIUM-HCTZ) once daily  #90 x 3   Entered and Authorized by:   Tresa Garter MD   Signed by:   Tresa Garter MD on 05/14/2010   Method used:   Print then Give to Patient   RxID:   [id number] LIPITOR 40 MG  TABS (ATORVASTATIN CALCIUM) take 1 tab by mouth once daily  #90 x 3   Entered and Authorized by:   Tresa Garter MD   Signed by:   Tresa Garter MD on 05/14/2010   Method used:   Print then Give to Patient   RxID:   [id number] PENNSAID 1.5 % SOLN (DICLOFENAC SODIUM) 3-5 gtt on skin three times a day for pain  #1 x 3   Entered and Authorized by:   Tresa Garter MD   Signed by:   Tresa Garter MD on 05/14/2010   Method used:   Print then Give to Patient   RxID:   [id number]    Orders Added: 1)  Est. Patient Level IV [67619] 2)  Admin 1st Vaccine [90471] 3)  Flu Vaccine 71yrs + [50932]    Influenza Vaccine (to be given today)  Flu Vaccine Consent Questions     Do you have a history of severe allergic reactions to this vaccine? no    Any prior history of allergic reactions to egg and/or gelatin? no    Do you have a sensitivity to the preservative Thimersol? no    Do you have a past history of Guillan-Barre Syndrome? no    Do you currently have an acute febrile illness? no    Have you ever had a severe reaction to latex? no    Vaccine information given and explained to patient? yes    Are you currently pregnant? no    Lot Number:[id number]   Exp Date:12/26/2010   Site Given  Right Deltoid IM Lanier Prude, [facility])  May 14, 2010 9:23 AM  .Craige Cotta

## 2010-10-23 ENCOUNTER — Encounter: Payer: Self-pay | Admitting: Internal Medicine

## 2010-10-23 ENCOUNTER — Ambulatory Visit (INDEPENDENT_AMBULATORY_CARE_PROVIDER_SITE_OTHER): Payer: 59 | Admitting: Internal Medicine

## 2010-10-23 DIAGNOSIS — Z9049 Acquired absence of other specified parts of digestive tract: Secondary | ICD-10-CM

## 2010-10-23 DIAGNOSIS — Z9889 Other specified postprocedural states: Secondary | ICD-10-CM

## 2010-10-23 NOTE — Progress Notes (Signed)
  Subjective:    Patient ID: William [name], male    DOB: 19-Jul-1945, 65 y.o.   MRN: [id number]  HPI He had appendectomy in [place] on 4/17th. C/o some residual pain. Sutures need to come out...   Review of Systems  Constitutional: Positive for activity change, appetite change and unexpected weight change (recovering from surgery).  Cardiovascular: Negative for leg swelling.  Genitourinary: Negative for flank pain.  Neurological: Negative for headaches.  Psychiatric/Behavioral: Negative for confusion.   Wt Readings from Last 3 Encounters:  10/23/10 186 lb (84.369 kg)  05/18/10 195 lb (88.451 kg)  05/14/10 193 lb (87.544 kg)       Objective:   Physical Exam  Constitutional: He is oriented to person, place, and time. He appears well-developed.  HENT:  Mouth/Throat: Oropharynx is clear and moist.  Eyes: Conjunctivae are normal. Pupils are equal, round, and reactive to light.  Neck: Normal range of motion. No JVD present. Tracheal deviation present. No thyromegaly present.  Cardiovascular: Normal rate, regular rhythm, normal heart sounds and intact distal pulses.  Exam reveals no gallop and no friction rub.   No murmur heard. Pulmonary/Chest: Effort normal and breath sounds normal. No respiratory distress. He has no wheezes. He has no rales. He exhibits no tenderness.  Abdominal: Soft. Bowel sounds are normal. He exhibits no distension and no mass. There is no tenderness. There is no rebound and no guarding.       Laparotomy scars w/sutures are present  Musculoskeletal: Normal range of motion. He exhibits no edema and no tenderness.  Lymphadenopathy:    He has no cervical adenopathy.  Neurological: He is alert and oriented to person, place, and time. He has normal reflexes. No cranial nerve deficit. He exhibits normal muscle tone. Coordination normal.  Skin: Skin is warm and dry. No rash noted.  Psychiatric: He has a normal mood and affect. His behavior is normal. Judgment and  thought content normal.          Assessment & Plan:  S/P appendectomy Staples removed  Total time over 20 mins > 50% spent counseling patient with regards to the problems above and staples removal.  HTN  Cont Rx

## 2010-10-23 NOTE — Assessment & Plan Note (Signed)
Staples removed 

## 2010-10-30 ENCOUNTER — Encounter: Payer: Self-pay | Admitting: Internal Medicine

## 2010-11-10 NOTE — Procedures (Signed)
NAME:  William [name], William [name]                ACCOUNT NO.:  [id number]   MEDICAL RECORD NO.:  [id number]          PATIENT TYPE:  OUT   LOCATION:  SLEEP CENTER                 FACILITY[facility]   PHYSICIAN:  Barbaraann Share, MD,FCCPDATE OF BIRTH:  1947/02/18   DATE OF STUDY:  10/04/2007                            NOCTURNAL POLYSOMNOGRAM   REFERRING PHYSICIAN:  Barbaraann Share, MD,FCCP   INDICATION FOR STUDY:  Hypersomnia with sleep apnea.   EPWORTH SCORE:  19.   SLEEP ARCHITECTURE:  The patient had a total sleep time of 351 minutes  with significantly decreased slow-wave sleep, as well as REM.  Sleep  onset latency was mildly prolonged at 31 minutes, and REM onset was very  rapid at 41 minutes.  Sleep efficiency was decreased at 82%.   RESPIRATORY DATA:  The patient was found to have 3 obstructive apneas  and 205 hypopneas for an apnea-hypopnea index of 36 events per hour.  The events were not positional, and there was very loud snoring noted  throughout.   OXYGEN DATA:  There was O2 desaturation transiently to 59% with the  patient's obstructive events.   CARDIAC DATA:  Rare PVC noted, but no clinically significant arrhythmia.   MOVEMENT-PARASOMNIA:  Patient was found to have 81 leg jerks with only 3  resulting in arousal or awakening.   IMPRESSION/RECOMMENDATION:  1. Moderate to severe obstructive sleep apnea/hypopnea syndrome with      an apnea-hypopnea index of 36 events per hour and O2 desaturation      as low as 59%.  Treatment for this degree of sleep apnea should      focus on weight loss, if applicable, as well as CPAP.  2. Rare premature ventricular contractions noted, but no clinically      significant arrhythmia.  3. Moderate numbers of leg jerks with very little sleep disruption.     Barbaraann Share, MD,FCCP  Diplomate, American Board of Sleep  Medicine  Electronically Signed    [facility]MED[facility]  D:  10/10/2007 15:08:39  T:  10/10/2007 15:20:45  Job:  [id number]

## 2010-11-12 ENCOUNTER — Other Ambulatory Visit: Payer: Self-pay | Admitting: Internal Medicine

## 2010-11-12 ENCOUNTER — Other Ambulatory Visit: Payer: Self-pay

## 2010-11-12 DIAGNOSIS — Z0389 Encounter for observation for other suspected diseases and conditions ruled out: Secondary | ICD-10-CM

## 2010-11-12 DIAGNOSIS — Z0000 Encounter for general adult medical examination without abnormal findings: Secondary | ICD-10-CM

## 2010-11-20 ENCOUNTER — Encounter: Payer: Self-pay | Admitting: Internal Medicine

## 2010-11-26 ENCOUNTER — Other Ambulatory Visit (INDEPENDENT_AMBULATORY_CARE_PROVIDER_SITE_OTHER): Payer: 59

## 2010-11-26 DIAGNOSIS — Z0000 Encounter for general adult medical examination without abnormal findings: Secondary | ICD-10-CM

## 2010-11-26 DIAGNOSIS — Z0389 Encounter for observation for other suspected diseases and conditions ruled out: Secondary | ICD-10-CM

## 2010-11-26 LAB — LIPID PANEL
Cholesterol: 160 mg/dL (ref 0–200)
HDL: 54.8 mg/dL (ref >39.00)
Total CHOL/HDL Ratio: 3
Triglycerides: 130 mg/dL (ref 0.0–149.0)

## 2010-11-26 LAB — CBC WITH DIFFERENTIAL/PLATELET
Basophils Absolute: 0 10*3/uL (ref 0.0–0.1)
Eosinophils Absolute: 0.2 10*3/uL (ref 0.0–0.7)
Lymphocytes Relative: 27.6 % (ref 12.0–46.0)
MCHC: 34.6 g/dL (ref 30.0–36.0)
MCV: 88.7 fl (ref 78.0–100.0)
Monocytes Absolute: 0.3 10*3/uL (ref 0.1–1.0)
Neutro Abs: 3.2 10*3/uL (ref 1.4–7.7)
Neutrophils Relative %: 62.4 % (ref 43.0–77.0)
RDW: 14.1 % (ref 11.5–14.6)

## 2010-11-26 LAB — URINALYSIS
Bilirubin Urine: NEGATIVE
Ketones, ur: NEGATIVE
Total Protein, Urine: NEGATIVE
Urine Glucose: NEGATIVE
pH: 7.5 (ref 5.0–8.0)

## 2010-11-26 LAB — HEPATIC FUNCTION PANEL
AST: 35 U/L (ref 0–37)
Albumin: 4 g/dL (ref 3.5–5.2)
Alkaline Phosphatase: 110 U/L (ref 39–117)
Total Protein: 6.9 g/dL (ref 6.0–8.3)

## 2010-11-26 LAB — BASIC METABOLIC PANEL
CO2: 32 mEq/L (ref 19–32)
Calcium: 9.3 mg/dL (ref 8.4–10.5)
GFR: 68.04 mL/min (ref >60.00)
Sodium: 141 mEq/L (ref 135–145)

## 2010-12-02 ENCOUNTER — Encounter: Payer: Self-pay | Admitting: Internal Medicine

## 2010-12-03 ENCOUNTER — Encounter: Payer: Self-pay | Admitting: Internal Medicine

## 2010-12-03 ENCOUNTER — Ambulatory Visit (INDEPENDENT_AMBULATORY_CARE_PROVIDER_SITE_OTHER): Payer: 59 | Admitting: Internal Medicine

## 2010-12-03 VITALS — BP 100/68 | HR 80 | Temp 98.4°F | Resp 16 | Ht 66.0 in | Wt 187.0 lb

## 2010-12-03 DIAGNOSIS — E785 Hyperlipidemia, unspecified: Secondary | ICD-10-CM

## 2010-12-03 DIAGNOSIS — L570 Actinic keratosis: Secondary | ICD-10-CM | POA: Insufficient documentation

## 2010-12-03 DIAGNOSIS — I10 Essential (primary) hypertension: Secondary | ICD-10-CM

## 2010-12-03 DIAGNOSIS — Z0000 Encounter for general adult medical examination without abnormal findings: Secondary | ICD-10-CM | POA: Insufficient documentation

## 2010-12-03 DIAGNOSIS — D485 Neoplasm of uncertain behavior of skin: Secondary | ICD-10-CM

## 2010-12-03 DIAGNOSIS — R972 Elevated prostate specific antigen [PSA]: Secondary | ICD-10-CM

## 2010-12-03 MED ORDER — CIPROFLOXACIN HCL 500 MG PO TABS: 500.0000 mg | ORAL_TABLET | Freq: Two times a day (BID) | ORAL | Status: AC

## 2010-12-03 NOTE — Progress Notes (Signed)
Subjective:    Patient ID: William [name], male    DOB: October 19, 1945, 65 y.o.   MRN: [id number]  HPI  The patient is here for a wellness exam. The patient has been doing well overall without major physical or psychological issues going on lately. The patient needs to address  chronic hypertension that has been well controlled with medicines; to address chronic  hyperlipidemia controlled with medicines as well                      Review of Systems  Constitutional: Negative for appetite change, fatigue and unexpected weight change.  HENT: Negative for nosebleeds, congestion, sore throat, sneezing, trouble swallowing and neck pain.   Eyes: Negative for itching and visual disturbance.  Respiratory: Negative for cough.   Cardiovascular: Negative for chest pain, palpitations and leg swelling.  Gastrointestinal: Negative for nausea, diarrhea, blood in stool and abdominal distention.  Genitourinary: Negative for frequency and hematuria.  Musculoskeletal: Negative for back pain, joint swelling and gait problem.  Skin: Negative for rash.  Neurological: Negative for dizziness, tremors, speech difficulty and weakness.  Psychiatric/Behavioral: Negative for sleep disturbance, dysphoric mood and agitation. The patient is not nervous/anxious.        Objective:   Physical Exam  Constitutional: He is oriented to person, place, and time. He appears well-developed and well-nourished. No distress.  HENT:  Head: Normocephalic and atraumatic.  Right Ear: External ear normal.  Left Ear: External ear normal.  Nose: Nose normal.  Mouth/Throat: Oropharynx is clear and moist. No oropharyngeal exudate.  Eyes: Conjunctivae and EOM are normal. Pupils are equal, round, and reactive to light. Right eye exhibits no discharge. Left eye exhibits no discharge. No scleral icterus.  Neck: Normal range of motion. Neck supple. No JVD present. No tracheal deviation present. No thyromegaly present.  Cardiovascular: Normal  rate, regular rhythm, normal heart sounds and intact distal pulses.  Exam reveals no gallop and no friction rub.   No murmur heard. Pulmonary/Chest: Effort normal and breath sounds normal. No stridor. No respiratory distress. He has no wheezes. He has no rales. He exhibits no tenderness.  Abdominal: Soft. Bowel sounds are normal. He exhibits no distension and no mass. There is no tenderness. There is no rebound and no guarding.  Genitourinary: Rectum normal and prostate normal. Guaiac negative stool. Penile tenderness present.       Prostate 1+  Musculoskeletal: Normal range of motion. He exhibits no edema and no tenderness.  Lymphadenopathy:    He has no cervical adenopathy.  Neurological: He is alert and oriented to person, place, and time. He has normal reflexes. No cranial nerve deficit. He exhibits normal muscle tone. Coordination normal.  Skin: Skin is warm and dry. No rash noted. He is not diaphoretic. No erythema. No pallor.  Psychiatric: He has a normal mood and affect. His behavior is normal. Judgment and thought content normal.      AK x 5 on scalp Mole on L cheek   Lab Results  Component Value Date   WBC 5.2 11/26/2010   HGB 14.4 11/26/2010   HCT 41.7 11/26/2010   PLT 181.0 11/26/2010   CHOL 160 11/26/2010   TRIG 130.0 11/26/2010   HDL 54.80 11/26/2010   ALT 55* 11/26/2010   AST 35 11/26/2010   NA 141 11/26/2010   K 4.0 11/26/2010   CL 101 11/26/2010   CREATININE 1.2 11/26/2010   BUN 15 11/26/2010   CO2 32 11/26/2010   TSH 2.47  11/26/2010   PSA 2.11 11/26/2010    Assessment & Plan:  Travel - Cipro prn

## 2010-12-03 NOTE — Assessment & Plan Note (Signed)
We discussed age appropriate health related issues, including available/recomended screening tests and vaccinations. We discussed a need for adhering to healthy diet and exercise. Labs/EKG were reviewed/ordered. All questions were answered.   

## 2010-12-03 NOTE — Assessment & Plan Note (Signed)
Stable PSA

## 2010-12-03 NOTE — Assessment & Plan Note (Signed)
On Rx 

## 2010-12-03 NOTE — Assessment & Plan Note (Signed)
Controlled on Rx 

## 2010-12-03 NOTE — Assessment & Plan Note (Signed)
Procedure Note :     Procedure : Cryosurgery   Indication:   Actinic keratosis(es)   Risks including unsuccessful procedure , bleeding, infection, bruising, scar, a need for a repeat  procedure and others were explained to the patient in detail as well as the benefits. Informed consent was obtained verbally.    5 lesion(s)  on scalp   was/were treated with liquid nitrogen on a Q-tip in a usual fasion . Band-Aid was applied and antibiotic ointment was given for a later use.   Tolerated well. Complications none.   Postprocedure instructions :     Keep the wounds clean. You can wash them with liquid soap and water. Pat dry with gauze or a Kleenex tissue  Before applying antibiotic ointment and a Band-Aid.   You need to report immediately  if  any signs of infection develop.

## 2010-12-03 NOTE — Assessment & Plan Note (Signed)
Sch skin bx 

## 2010-12-23 ENCOUNTER — Ambulatory Visit: Payer: 59 | Admitting: Internal Medicine

## 2011-03-18 ENCOUNTER — Ambulatory Visit (INDEPENDENT_AMBULATORY_CARE_PROVIDER_SITE_OTHER): Payer: 59 | Admitting: Internal Medicine

## 2011-03-18 ENCOUNTER — Encounter: Payer: Self-pay | Admitting: Internal Medicine

## 2011-03-18 ENCOUNTER — Other Ambulatory Visit: Payer: Self-pay | Admitting: Internal Medicine

## 2011-03-18 DIAGNOSIS — D485 Neoplasm of uncertain behavior of skin: Secondary | ICD-10-CM

## 2011-03-18 DIAGNOSIS — B079 Viral wart, unspecified: Secondary | ICD-10-CM | POA: Insufficient documentation

## 2011-03-18 NOTE — Assessment & Plan Note (Signed)
Procedure Note :     Procedure : Cryosurgery   Indication:  Wart(s)   Risks including unsuccessful procedure , bleeding, infection, bruising, scar, a need for a repeat  procedure and others were explained to the patient in detail as well as the benefits. Informed consent was obtained verbally.    3 lesion(s)  On R forearm and 2 on L forearm   was/were treated with liquid nitrogen on a Q-tip in a usual fasion . Band-Aid was applied and antibiotic ointment was given for a later use.   Tolerated well. Complications none.   Postprocedure instructions :     Keep the wounds clean. You can wash them with liquid soap and water. Pat dry with gauze or a Kleenex tissue  Before applying antibiotic ointment and a Band-Aid.   You need to report immediately  if  any signs of infection develop.

## 2011-03-18 NOTE — Progress Notes (Signed)
Addended by: Merrilyn Puma on: 03/18/2011 11:53 AM   Modules accepted: Orders

## 2011-03-18 NOTE — Progress Notes (Signed)
  Subjective:    Patient ID: William [name], male    DOB: 1945-09-18, 65 y.o.   MRN: [id number]  HPI  For skin bx - L cheek C/o warts on B arms - needs me to remove them  Review of Systems     Objective:   Physical Exam  Skin:       L cheek mole 3-5 mm warts on B forearms          Assessment & Plan:

## 2011-03-18 NOTE — Assessment & Plan Note (Signed)
Procedure Note :     Procedure :  Skin biopsy   Indication:  Changing mole    Risks including unsuccessful procedure , bleeding, infection, bruising, scar, a need for another complete procedure and others were explained to the patient in detail as well as the benefits. Informed consent was obtained and signed.   The patient was placed in a decubitus position.  Lesion #1 on   L cheek  measuring     11x8 mm   Skin over lesion #1  was prepped with Betadine and alcohol  and anesthetized with 1 cc of 2% lidocaine and epinephrine, using a 30-gauge 1 inch needle.  Shave biopsy with a sterile Dermablade was carried out in the usual fashion. Hyfrecator was used to destroy the rest of the lesion potentially left behind and for hemostasis. Band-Aid was applied with antibiotic ointment.      Tolerated well. Complications none.      Postprocedure instructions :    A Band-Aid should be  changed twice daily. You can take a shower tomorrow.  Keep the wounds clean. You can wash them with liquid soap and water. Pat dry with gauze or a Kleenex tissue  Before applying antibiotic ointment and a Band-Aid.   You need to report immediately  if fever, chills or any signs of infection develop.    The biopsy results should be available in 1 -2 weeks.

## 2011-03-22 ENCOUNTER — Telehealth: Payer: Self-pay | Admitting: Internal Medicine

## 2011-03-22 NOTE — Telephone Encounter (Signed)
William [name], please, inform patient that his bx was benign Thx

## 2011-03-23 NOTE — Telephone Encounter (Signed)
Left detailed mess informing pt of below.  

## 2011-05-17 ENCOUNTER — Encounter: Payer: Self-pay | Admitting: Pulmonary Disease

## 2011-05-17 ENCOUNTER — Other Ambulatory Visit: Payer: Self-pay | Admitting: Internal Medicine

## 2011-05-17 ENCOUNTER — Ambulatory Visit (INDEPENDENT_AMBULATORY_CARE_PROVIDER_SITE_OTHER): Payer: 59 | Admitting: Pulmonary Disease

## 2011-05-17 VITALS — BP 140/82 | HR 66 | Temp 98.4°F | Ht 66.0 in | Wt 197.8 lb

## 2011-05-17 DIAGNOSIS — G4733 Obstructive sleep apnea (adult) (pediatric): Secondary | ICD-10-CM

## 2011-05-17 DIAGNOSIS — Z23 Encounter for immunization: Secondary | ICD-10-CM

## 2011-05-17 NOTE — Progress Notes (Signed)
Addended by: Salli Quarry on: 05/17/2011 11:43 AM   Modules accepted: Orders

## 2011-05-17 NOTE — Patient Instructions (Signed)
Will give you the flu shot today Continue with cpap, keep up with mask changes and supplies Work on weight loss followup with me in one year.

## 2011-05-17 NOTE — Progress Notes (Signed)
  Subjective:    Patient ID: William [name], male    DOB: Nov 11, 1945, 65 y.o.   MRN: [id number]  HPI The patient comes in today for followup of his known obstructive sleep apnea.  He's been wearing CPAP compliantly, reports no issues with his mask fit or device.  He feels that he is sleeping well, and denies any alert his issues during the day.  He is working on weight reduction by running every day.   Review of Systems  Constitutional: Negative for fever and unexpected weight change.  HENT: Negative for ear pain, nosebleeds, congestion, sore throat, rhinorrhea, sneezing, trouble swallowing, dental problem, postnasal drip and sinus pressure.   Eyes: Negative for redness and itching.  Respiratory: Negative for cough, chest tightness, shortness of breath and wheezing.   Cardiovascular: Negative for palpitations and leg swelling.  Gastrointestinal: Negative for nausea and vomiting.  Genitourinary: Negative for dysuria.  Musculoskeletal: Negative for joint swelling.  Skin: Negative for rash.  Neurological: Positive for dizziness. Negative for headaches.  Hematological: Does not bruise/bleed easily.  Psychiatric/Behavioral: Negative for dysphoric mood. The patient is not nervous/anxious.        Objective:   Physical Exam Ow male in nad No skin breakdown or pressure necrosis from cpap mask LE without edema, no cyanosis  Alert and oriented, does not appear sleepy, moves all 4.        Assessment & Plan:

## 2011-05-17 NOTE — Assessment & Plan Note (Signed)
The pt is doing well on cpap, and reports no issues with mask or device.  He is sleeping well, and is satisfied with his daytime alertness.  I have asked him to continue with his cpap, work on weight loss, and to followup with me in one year.

## 2011-05-28 ENCOUNTER — Other Ambulatory Visit (INDEPENDENT_AMBULATORY_CARE_PROVIDER_SITE_OTHER): Payer: 59

## 2011-05-28 DIAGNOSIS — E785 Hyperlipidemia, unspecified: Secondary | ICD-10-CM

## 2011-05-28 LAB — COMPREHENSIVE METABOLIC PANEL
Albumin: 4.1 g/dL (ref 3.5–5.2)
BUN: 19 mg/dL (ref 6–23)
Calcium: 9.2 mg/dL (ref 8.4–10.5)
Chloride: 103 mEq/L (ref 96–112)
GFR: 70.76 mL/min (ref >60.00)
Glucose, Bld: 112 mg/dL — ABNORMAL HIGH (ref 70–99)
Potassium: 3.8 mEq/L (ref 3.5–5.1)

## 2011-05-28 LAB — LIPID PANEL
Cholesterol: 161 mg/dL (ref 0–200)
Triglycerides: 98 mg/dL (ref 0.0–149.0)

## 2011-06-04 ENCOUNTER — Ambulatory Visit (INDEPENDENT_AMBULATORY_CARE_PROVIDER_SITE_OTHER): Payer: 59 | Admitting: Internal Medicine

## 2011-06-04 ENCOUNTER — Encounter: Payer: Self-pay | Admitting: Internal Medicine

## 2011-06-04 VITALS — BP 120/80 | HR 80 | Temp 98.4°F | Resp 16 | Wt 196.0 lb

## 2011-06-04 DIAGNOSIS — Z23 Encounter for immunization: Secondary | ICD-10-CM

## 2011-06-04 DIAGNOSIS — I10 Essential (primary) hypertension: Secondary | ICD-10-CM

## 2011-06-04 DIAGNOSIS — R972 Elevated prostate specific antigen [PSA]: Secondary | ICD-10-CM

## 2011-06-04 DIAGNOSIS — E876 Hypokalemia: Secondary | ICD-10-CM

## 2011-06-04 DIAGNOSIS — Z789 Other specified health status: Secondary | ICD-10-CM | POA: Insufficient documentation

## 2011-06-04 DIAGNOSIS — E785 Hyperlipidemia, unspecified: Secondary | ICD-10-CM

## 2011-06-04 MED ORDER — DIPHENOXYLATE-ATROPINE 2.5-0.025 MG PO TABS: 1.0000 | ORAL_TABLET | Freq: Four times a day (QID) | ORAL | Status: AC | PRN

## 2011-06-04 MED ORDER — CIPROFLOXACIN HCL 500 MG PO TABS: 500.0000 mg | ORAL_TABLET | Freq: Two times a day (BID) | ORAL | Status: AC

## 2011-06-04 NOTE — Assessment & Plan Note (Signed)
Cipro and Lomotil Rx's

## 2011-06-04 NOTE — Assessment & Plan Note (Signed)
Resolved

## 2011-06-04 NOTE — Assessment & Plan Note (Signed)
Continue with current prescription therapy as reflected on the Med list.  

## 2011-06-04 NOTE — Assessment & Plan Note (Signed)
Normalized in 2012

## 2011-06-04 NOTE — Progress Notes (Signed)
  Subjective:    Patient ID: William [name], male    DOB: January 28, 1946, 65 y.o.   MRN: [id number]  HPI  The patient presents for a follow-up of  chronic hypertension, chronic dyslipidemia, type elev glu controlled with medicines. Running 3d/wk.  BP Readings from Last 3 Encounters:  06/04/11 120/80  05/17/11 140/82  03/18/11 102/70   Wt Readings from Last 3 Encounters:  06/04/11 196 lb (88.905 kg)  05/17/11 197 lb 12.8 oz (89.721 kg)  03/18/11 190 lb (86.183 kg)      Review of Systems  Constitutional: Negative for appetite change, fatigue and unexpected weight change.  HENT: Negative for nosebleeds, congestion, sore throat, sneezing, trouble swallowing and neck pain.   Eyes: Negative for itching and visual disturbance.  Respiratory: Negative for cough.   Cardiovascular: Negative for chest pain, palpitations and leg swelling.  Gastrointestinal: Negative for nausea, diarrhea, blood in stool and abdominal distention.  Genitourinary: Negative for frequency and hematuria.  Musculoskeletal: Negative for back pain, joint swelling and gait problem.  Skin: Negative for rash.  Neurological: Negative for dizziness, tremors, speech difficulty and weakness.  Psychiatric/Behavioral: Negative for suicidal ideas, sleep disturbance, dysphoric mood and agitation. The patient is not nervous/anxious.        Objective:   Physical Exam  Constitutional: He is oriented to person, place, and time. He appears well-developed.  HENT:  Mouth/Throat: Oropharynx is clear and moist.  Eyes: Conjunctivae are normal. Pupils are equal, round, and reactive to light.  Neck: Normal range of motion. No JVD present. No thyromegaly present.  Cardiovascular: Normal rate, regular rhythm, normal heart sounds and intact distal pulses.  Exam reveals no gallop and no friction rub.   No murmur heard. Pulmonary/Chest: Effort normal and breath sounds normal. No respiratory distress. He has no wheezes. He has no rales. He  exhibits no tenderness.  Abdominal: Soft. Bowel sounds are normal. He exhibits no distension and no mass. There is no tenderness. There is no rebound and no guarding.  Musculoskeletal: Normal range of motion. He exhibits no edema and no tenderness.  Lymphadenopathy:    He has no cervical adenopathy.  Neurological: He is alert and oriented to person, place, and time. He has normal reflexes. No cranial nerve deficit. He exhibits normal muscle tone. Coordination normal.  Skin: Skin is warm and dry. No rash noted.  Psychiatric: He has a normal mood and affect. His behavior is normal. Judgment and thought content normal.     Lab Results  Component Value Date   WBC 5.2 11/26/2010   HGB 14.4 11/26/2010   HCT 41.7 11/26/2010   PLT 181.0 11/26/2010   GLUCOSE 112* 05/28/2011   CHOL 161 05/28/2011   TRIG 98.0 05/28/2011   HDL 57.00 05/28/2011   LDLCALC 84 05/28/2011   ALT 39 05/28/2011   AST 32 05/28/2011   NA 142 05/28/2011   K 3.8 05/28/2011   CL 103 05/28/2011   CREATININE 1.1 05/28/2011   BUN 19 05/28/2011   CO2 32 05/28/2011   TSH 2.47 11/26/2010   PSA 2.11 11/26/2010        Assessment & Plan:

## 2011-06-29 ENCOUNTER — Other Ambulatory Visit: Payer: Self-pay | Admitting: Internal Medicine

## 2011-07-28 ENCOUNTER — Other Ambulatory Visit: Payer: Self-pay | Admitting: Internal Medicine

## 2011-12-06 ENCOUNTER — Other Ambulatory Visit (INDEPENDENT_AMBULATORY_CARE_PROVIDER_SITE_OTHER): Payer: 59

## 2011-12-06 ENCOUNTER — Ambulatory Visit (INDEPENDENT_AMBULATORY_CARE_PROVIDER_SITE_OTHER): Payer: 59 | Admitting: Internal Medicine

## 2011-12-06 ENCOUNTER — Encounter: Payer: Self-pay | Admitting: Internal Medicine

## 2011-12-06 VITALS — BP 140/90 | HR 76 | Temp 98.3°F | Resp 16 | Ht 66.0 in | Wt 189.0 lb

## 2011-12-06 DIAGNOSIS — I10 Essential (primary) hypertension: Secondary | ICD-10-CM

## 2011-12-06 DIAGNOSIS — R7309 Other abnormal glucose: Secondary | ICD-10-CM

## 2011-12-06 DIAGNOSIS — Z136 Encounter for screening for cardiovascular disorders: Secondary | ICD-10-CM

## 2011-12-06 DIAGNOSIS — E785 Hyperlipidemia, unspecified: Secondary | ICD-10-CM

## 2011-12-06 DIAGNOSIS — N529 Male erectile dysfunction, unspecified: Secondary | ICD-10-CM

## 2011-12-06 DIAGNOSIS — N320 Bladder-neck obstruction: Secondary | ICD-10-CM

## 2011-12-06 DIAGNOSIS — R739 Hyperglycemia, unspecified: Secondary | ICD-10-CM

## 2011-12-06 DIAGNOSIS — Z0000 Encounter for general adult medical examination without abnormal findings: Secondary | ICD-10-CM

## 2011-12-06 LAB — URINALYSIS, ROUTINE W REFLEX MICROSCOPIC
Bilirubin Urine: NEGATIVE
Ketones, ur: NEGATIVE
Leukocytes, UA: NEGATIVE
Urine Glucose: NEGATIVE
pH: 6 (ref 5.0–8.0)

## 2011-12-06 LAB — TSH: TSH: 2.43 u[IU]/mL (ref 0.35–5.50)

## 2011-12-06 LAB — BASIC METABOLIC PANEL
BUN: 19 mg/dL (ref 6–23)
CO2: 33 mEq/L — ABNORMAL HIGH (ref 19–32)
Calcium: 9.6 mg/dL (ref 8.4–10.5)
Creatinine, Ser: 1.2 mg/dL (ref 0.4–1.5)

## 2011-12-06 LAB — HEMOGLOBIN A1C: Hgb A1c MFr Bld: 5.9 % (ref 4.6–6.5)

## 2011-12-06 LAB — CBC WITH DIFFERENTIAL/PLATELET
Basophils Absolute: 0 10*3/uL (ref 0.0–0.1)
Basophils Relative: 0.4 % (ref 0.0–3.0)
Eosinophils Absolute: 0.1 10*3/uL (ref 0.0–0.7)
HCT: 46.9 % (ref 39.0–52.0)
Hemoglobin: 15.7 g/dL (ref 13.0–17.0)
Lymphs Abs: 1.4 10*3/uL (ref 0.7–4.0)
MCHC: 33.5 g/dL (ref 30.0–36.0)
Monocytes Relative: 9 % (ref 3.0–12.0)
Neutro Abs: 4 10*3/uL (ref 1.4–7.7)
RDW: 14.3 % (ref 11.5–14.6)

## 2011-12-06 NOTE — Progress Notes (Signed)
Patient ID: William [name], male   DOB: [birth year], 66 y.o.   MRN: [id number]  Subjective:    Patient ID: William [name], male    DOB: 03-24-1946, 66 y.o.   MRN: [id number]  HPI  The patient is here for a wellness exam. The patient has been doing well overall without major physical or psychological issues going on lately.  The patient needs to address  chronic hypertension that has been well controlled with medicines; to address chronic  hyperlipidemia controlled with medicines as well   BP Readings from Last 3 Encounters:  12/06/11 140/90  06/04/11 120/80  05/17/11 140/82   Wt Readings from Last 3 Encounters:  12/06/11 189 lb (85.73 kg)  06/04/11 196 lb (88.905 kg)  05/17/11 197 lb 12.8 oz (89.721 kg)                          Review of Systems  Constitutional: Negative for appetite change, fatigue and unexpected weight change.  HENT: Negative for nosebleeds, congestion, sore throat, sneezing, trouble swallowing and neck pain.   Eyes: Negative for itching and visual disturbance.  Respiratory: Negative for cough.   Cardiovascular: Negative for chest pain, palpitations and leg swelling.  Gastrointestinal: Negative for nausea, diarrhea, blood in stool and abdominal distention.  Genitourinary: Negative for frequency and hematuria.  Musculoskeletal: Negative for back pain, joint swelling and gait problem.  Skin: Negative for rash.  Neurological: Negative for dizziness, tremors, speech difficulty and weakness.  Psychiatric/Behavioral: Negative for sleep disturbance, dysphoric mood and agitation. The patient is not nervous/anxious.        Objective:   Physical Exam  Constitutional: He is oriented to person, place, and time. He appears well-developed and well-nourished. No distress.  HENT:  Head: Normocephalic and atraumatic.  Right Ear: External ear normal.  Left Ear: External ear normal.  Nose: Nose normal.  Mouth/Throat: Oropharynx is clear and moist. No oropharyngeal  exudate.  Eyes: Conjunctivae and EOM are normal. Pupils are equal, round, and reactive to light. Right eye exhibits no discharge. Left eye exhibits no discharge. No scleral icterus.  Neck: Normal range of motion. Neck supple. No JVD present. No tracheal deviation present. No thyromegaly present.  Cardiovascular: Normal rate, regular rhythm, normal heart sounds and intact distal pulses.  Exam reveals no gallop and no friction rub.   No murmur heard. Pulmonary/Chest: Effort normal and breath sounds normal. No stridor. No respiratory distress. He has no wheezes. He has no rales. He exhibits no tenderness.  Abdominal: Soft. Bowel sounds are normal. He exhibits no distension and no mass. There is no tenderness. There is no rebound and no guarding.  Genitourinary: Rectum normal and prostate normal. Guaiac negative stool. Penile tenderness present.       Prostate 1+  Musculoskeletal: Normal range of motion. He exhibits no edema and no tenderness.  Lymphadenopathy:    He has no cervical adenopathy.  Neurological: He is alert and oriented to person, place, and time. He has normal reflexes. No cranial nerve deficit. He exhibits normal muscle tone. Coordination normal.  Skin: Skin is warm and dry. No rash noted. He is not diaphoretic. No erythema. No pallor.  Psychiatric: He has a normal mood and affect. His behavior is normal. Judgment and thought content normal.         Lab Results  Component Value Date   WBC 5.2 11/26/2010   HGB 14.4 11/26/2010   HCT 41.7 11/26/2010   PLT 181.0 11/26/2010  CHOL 161 05/28/2011   TRIG 98.0 05/28/2011   HDL 57.00 05/28/2011   ALT 39 05/28/2011   AST 32 05/28/2011   NA 142 05/28/2011   K 3.8 05/28/2011   CL 103 05/28/2011   CREATININE 1.1 05/28/2011   BUN 19 05/28/2011   CO2 32 05/28/2011   TSH 2.47 11/26/2010   PSA 2.11 11/26/2010    Assessment & Plan:

## 2011-12-06 NOTE — Assessment & Plan Note (Signed)
Labs  Continue with current prescription therapy as reflected on the Med list.  

## 2011-12-06 NOTE — Assessment & Plan Note (Signed)
A1c

## 2011-12-06 NOTE — Assessment & Plan Note (Signed)

## 2011-12-06 NOTE — Assessment & Plan Note (Signed)
Continue with current prescription prn therapy as reflected on the Med list.  

## 2011-12-06 NOTE — Assessment & Plan Note (Signed)
Continue with current prescription therapy as reflected on the Med list.  

## 2011-12-07 ENCOUNTER — Encounter: Payer: 59 | Admitting: Internal Medicine

## 2011-12-07 ENCOUNTER — Telehealth: Payer: Self-pay | Admitting: Internal Medicine

## 2011-12-07 NOTE — Telephone Encounter (Signed)
Stacey, please, inform patient that all labs are normal   Please, mail the labs to the patient.    Thx  

## 2011-12-07 NOTE — Telephone Encounter (Signed)
Left detailed mess informing pt. Copies mailed.  

## 2012-01-19 ENCOUNTER — Other Ambulatory Visit: Payer: Self-pay | Admitting: Internal Medicine

## 2012-03-03 ENCOUNTER — Other Ambulatory Visit: Payer: Self-pay | Admitting: Internal Medicine

## 2012-05-17 ENCOUNTER — Encounter: Payer: Self-pay | Admitting: Pulmonary Disease

## 2012-05-17 ENCOUNTER — Ambulatory Visit (INDEPENDENT_AMBULATORY_CARE_PROVIDER_SITE_OTHER): Payer: Medicare Other | Admitting: Pulmonary Disease

## 2012-05-17 VITALS — BP 118/80 | HR 81 | Temp 97.3°F | Ht 66.0 in | Wt 201.0 lb

## 2012-05-17 DIAGNOSIS — G4733 Obstructive sleep apnea (adult) (pediatric): Secondary | ICD-10-CM

## 2012-05-17 DIAGNOSIS — Z23 Encounter for immunization: Secondary | ICD-10-CM

## 2012-05-17 NOTE — Assessment & Plan Note (Signed)
The patient is doing well on CPAP currently, and is having no issues with his mask or pressure.  I've asked him to continue with CPAP, keep up with his supplies, and to work aggressively on weight loss.

## 2012-05-17 NOTE — Progress Notes (Signed)
  Subjective:    Patient ID: William [name], male    DOB: Jun 28, 1946, 66 y.o.   MRN: [id number]  HPI The patient comes in today for followup of his known obstructive sleep apnea.  He is wearing CPAP compliantly, and denies any issues with his mask fit or pressure.  He feels that he sleeps well, and denies any alertness issues during the day.  He has been keeping up with his supplies.  His weight is up a few pounds from the last visit but he is working on this.   Review of Systems  Constitutional: Negative for fever and unexpected weight change.  HENT: Negative for ear pain, nosebleeds, congestion, sore throat, rhinorrhea, sneezing, trouble swallowing, dental problem, postnasal drip and sinus pressure.   Eyes: Negative for redness and itching.  Respiratory: Negative for cough, chest tightness, shortness of breath and wheezing.   Cardiovascular: Negative for palpitations and leg swelling.  Gastrointestinal: Negative for nausea and vomiting.  Genitourinary: Negative for dysuria.  Musculoskeletal: Negative for joint swelling.  Skin: Negative for rash.  Neurological: Negative for headaches.  Hematological: Does not bruise/bleed easily.  Psychiatric/Behavioral: Negative for dysphoric mood. The patient is not nervous/anxious.        Objective:   Physical Exam Overweight male in no acute distress Nose without purulence or discharge noted No skin breakdown or pressure necrosis from the CPAP mask Lower extremities without edema, cyanosis Alert, does not appear to be sleepy, moves all 4 extremities.       Assessment & Plan:

## 2012-05-17 NOTE — Patient Instructions (Addendum)
Will give you a flu shot today Work on weight loss Keep up with mask changes and supplies. If doing well, followup with me in one year.

## 2012-06-06 ENCOUNTER — Ambulatory Visit (INDEPENDENT_AMBULATORY_CARE_PROVIDER_SITE_OTHER): Payer: 59 | Admitting: Internal Medicine

## 2012-06-06 ENCOUNTER — Encounter: Payer: Self-pay | Admitting: Internal Medicine

## 2012-06-06 VITALS — BP 130/90 | HR 76 | Temp 98.2°F | Resp 16 | Wt 201.0 lb

## 2012-06-06 DIAGNOSIS — I10 Essential (primary) hypertension: Secondary | ICD-10-CM

## 2012-06-06 DIAGNOSIS — R7309 Other abnormal glucose: Secondary | ICD-10-CM

## 2012-06-06 DIAGNOSIS — G4733 Obstructive sleep apnea (adult) (pediatric): Secondary | ICD-10-CM

## 2012-06-06 DIAGNOSIS — R972 Elevated prostate specific antigen [PSA]: Secondary | ICD-10-CM

## 2012-06-06 DIAGNOSIS — K219 Gastro-esophageal reflux disease without esophagitis: Secondary | ICD-10-CM

## 2012-06-06 DIAGNOSIS — E785 Hyperlipidemia, unspecified: Secondary | ICD-10-CM

## 2012-06-06 DIAGNOSIS — R739 Hyperglycemia, unspecified: Secondary | ICD-10-CM

## 2012-06-06 MED ORDER — VARDENAFIL HCL 20 MG PO TABS: 10.0000 mg | ORAL_TABLET | Freq: Every day | ORAL | Status: DC | PRN

## 2012-06-06 MED ORDER — ATORVASTATIN CALCIUM 40 MG PO TABS: 40.0000 mg | ORAL_TABLET | Freq: Every day | ORAL | Status: DC

## 2012-06-06 MED ORDER — LOSARTAN POTASSIUM-HCTZ 100-25 MG PO TABS: 1.0000 | ORAL_TABLET | Freq: Every day | ORAL | Status: DC

## 2012-06-06 MED ORDER — POTASSIUM CHLORIDE CRYS ER 20 MEQ PO TBCR: 20.0000 meq | EXTENDED_RELEASE_TABLET | Freq: Every day | ORAL | Status: DC

## 2012-06-06 NOTE — Assessment & Plan Note (Signed)
Continue with current prescription therapy as reflected on the Med list.  

## 2012-06-06 NOTE — Progress Notes (Signed)
Subjective:    Patient ID: [facility] William [name], male    DOB: 1945/08/18, 66 y.o.   MRN: [id number]  HPI  .  The patient needs to address  chronic hypertension that has been well controlled with medicines; to address chronic  hyperlipidemia controlled with medicines as well   BP Readings from Last 3 Encounters:  06/06/12 130/90  05/17/12 118/80  12/06/11 140/90   Wt Readings from Last 3 Encounters:  06/06/12 201 lb (91.173 kg)  05/17/12 201 lb (91.173 kg)  12/06/11 189 lb (85.73 kg)                          Review of Systems  Constitutional: Negative for appetite change, fatigue and unexpected weight change.  HENT: Negative for nosebleeds, congestion, sore throat, sneezing, trouble swallowing and neck pain.   Eyes: Negative for itching and visual disturbance.  Respiratory: Negative for cough.   Cardiovascular: Negative for chest pain, palpitations and leg swelling.  Gastrointestinal: Negative for nausea, diarrhea, blood in stool and abdominal distention.  Genitourinary: Negative for frequency and hematuria.  Musculoskeletal: Negative for back pain, joint swelling and gait problem.  Skin: Negative for rash.  Neurological: Negative for dizziness, tremors, speech difficulty and weakness.  Psychiatric/Behavioral: Negative for sleep disturbance, dysphoric mood and agitation. The patient is not nervous/anxious.        Objective:   Physical Exam  Constitutional: He is oriented to person, place, and time. He appears well-developed and well-nourished. No distress.  HENT:  Head: Normocephalic and atraumatic.  Right Ear: External ear normal.  Left Ear: External ear normal.  Nose: Nose normal.  Mouth/Throat: Oropharynx is clear and moist. No oropharyngeal exudate.  Eyes: Conjunctivae normal and EOM are normal. Pupils are equal, round, and reactive to light. Right eye exhibits no discharge. Left eye exhibits no discharge. No scleral icterus.  Neck: Normal range of motion. Neck  supple. No JVD present. No tracheal deviation present. No thyromegaly present.  Cardiovascular: Normal rate, regular rhythm, normal heart sounds and intact distal pulses.  Exam reveals no gallop and no friction rub.   No murmur heard. Pulmonary/Chest: Effort normal and breath sounds normal. No stridor. No respiratory distress. He has no wheezes. He has no rales. He exhibits no tenderness.  Abdominal: Soft. Bowel sounds are normal. He exhibits no distension and no mass. There is no tenderness. There is no rebound and no guarding.  Genitourinary: Rectum normal and prostate normal. Guaiac negative stool. Penile tenderness present.       Prostate 1+  Musculoskeletal: Normal range of motion. He exhibits no edema and no tenderness.  Lymphadenopathy:    He has no cervical adenopathy.  Neurological: He is alert and oriented to person, place, and time. He has normal reflexes. No cranial nerve deficit. He exhibits normal muscle tone. Coordination normal.  Skin: Skin is warm and dry. No rash noted. He is not diaphoretic. No erythema. No pallor.  Psychiatric: He has a normal mood and affect. His behavior is normal. Judgment and thought content normal.         Lab Results  Component Value Date   WBC 6.0 12/06/2011   HGB 15.7 12/06/2011   HCT 46.9 12/06/2011   PLT 207.0 12/06/2011   CHOL 161 05/28/2011   TRIG 98.0 05/28/2011   HDL 57.00 05/28/2011   ALT 39 05/28/2011   AST 32 05/28/2011   NA 142 12/06/2011   K 4.2 12/06/2011   CL 101  12/06/2011   CREATININE 1.2 12/06/2011   BUN 19 12/06/2011   CO2 33* 12/06/2011   TSH 2.43 12/06/2011   PSA 2.11 11/26/2010   HGBA1C 5.9 12/06/2011    Assessment & Plan:

## 2012-06-06 NOTE — Assessment & Plan Note (Signed)
Watching PSA 

## 2012-07-05 ENCOUNTER — Other Ambulatory Visit: Payer: Self-pay | Admitting: *Deleted

## 2012-07-05 MED ORDER — LOSARTAN POTASSIUM-HCTZ 100-25 MG PO TABS: 1.0000 | ORAL_TABLET | Freq: Every day | ORAL | Status: DC

## 2012-07-06 ENCOUNTER — Other Ambulatory Visit: Payer: Self-pay | Admitting: *Deleted

## 2012-07-06 ENCOUNTER — Other Ambulatory Visit (INDEPENDENT_AMBULATORY_CARE_PROVIDER_SITE_OTHER): Payer: Medicare Other

## 2012-07-06 DIAGNOSIS — R7309 Other abnormal glucose: Secondary | ICD-10-CM

## 2012-07-06 DIAGNOSIS — G4733 Obstructive sleep apnea (adult) (pediatric): Secondary | ICD-10-CM

## 2012-07-06 DIAGNOSIS — R972 Elevated prostate specific antigen [PSA]: Secondary | ICD-10-CM

## 2012-07-06 DIAGNOSIS — I10 Essential (primary) hypertension: Secondary | ICD-10-CM

## 2012-07-06 DIAGNOSIS — R739 Hyperglycemia, unspecified: Secondary | ICD-10-CM

## 2012-07-06 DIAGNOSIS — K219 Gastro-esophageal reflux disease without esophagitis: Secondary | ICD-10-CM

## 2012-07-06 LAB — BASIC METABOLIC PANEL
Chloride: 101 mEq/L (ref 96–112)
Creatinine, Ser: 1.2 mg/dL (ref 0.4–1.5)
Potassium: 3.9 mEq/L (ref 3.5–5.1)

## 2012-07-06 LAB — HEPATIC FUNCTION PANEL
ALT: 55 U/L — ABNORMAL HIGH (ref 0–53)
AST: 35 U/L (ref 0–37)
Albumin: 4.5 g/dL (ref 3.5–5.2)
Alkaline Phosphatase: 93 U/L (ref 39–117)
Total Protein: 7.4 g/dL (ref 6.0–8.3)

## 2012-07-06 LAB — LIPID PANEL
Cholesterol: 175 mg/dL (ref 0–200)
LDL Cholesterol: 102 mg/dL — ABNORMAL HIGH (ref 0–99)
Triglycerides: 106 mg/dL (ref 0.0–149.0)

## 2012-07-06 LAB — HEMOGLOBIN A1C: Hgb A1c MFr Bld: 6.1 % (ref 4.6–6.5)

## 2012-07-06 LAB — PSA: PSA: 1.91 ng/mL (ref 0.10–4.00)

## 2012-07-06 NOTE — Telephone Encounter (Signed)
Opened in error

## 2012-07-24 ENCOUNTER — Encounter: Payer: Self-pay | Admitting: Gastroenterology

## 2012-08-30 ENCOUNTER — Encounter: Payer: Self-pay | Admitting: Gastroenterology

## 2012-09-25 ENCOUNTER — Ambulatory Visit (AMBULATORY_SURGERY_CENTER): Payer: 59 | Admitting: *Deleted

## 2012-09-25 VITALS — Ht 66.0 in | Wt 201.4 lb

## 2012-09-25 DIAGNOSIS — Z1211 Encounter for screening for malignant neoplasm of colon: Secondary | ICD-10-CM

## 2012-09-25 MED ORDER — MOVIPREP 100 G PO SOLR: ORAL | Status: DC

## 2012-10-09 ENCOUNTER — Ambulatory Visit (AMBULATORY_SURGERY_CENTER): Payer: Medicare Other | Admitting: Gastroenterology

## 2012-10-09 ENCOUNTER — Encounter: Payer: Self-pay | Admitting: Gastroenterology

## 2012-10-09 VITALS — BP 129/80 | HR 65 | Temp 97.9°F | Resp 12 | Ht 66.0 in | Wt 201.0 lb

## 2012-10-09 DIAGNOSIS — Z860100 Personal history of colon polyps, unspecified: Secondary | ICD-10-CM

## 2012-10-09 DIAGNOSIS — D126 Benign neoplasm of colon, unspecified: Secondary | ICD-10-CM

## 2012-10-09 DIAGNOSIS — Z1211 Encounter for screening for malignant neoplasm of colon: Secondary | ICD-10-CM

## 2012-10-09 MED ORDER — SODIUM CHLORIDE 0.9 % IV SOLN: 500.0000 mL | INTRAVENOUS | Status: DC

## 2012-10-09 NOTE — Op Note (Signed)
[facility] [address] [place], [address]   COLONOSCOPY PROCEDURE REPORT  PATIENT: William, [name]  MR#: [id number] BIRTHDATE: 1946/02/25 , 66  yrs. old GENDER: Male ENDOSCOPIST: Mardella Layman, MD, [facility] REFERRED BY: PROCEDURE DATE:  10/09/2012 PROCEDURE:   Colonoscopy with snare polypectomy and Colonoscopy, surveillance ASA CLASS:   Class II INDICATIONS:Colorectal cancer screening. MEDICATIONS: Propofol (Diprivan) 120 mg IV  DESCRIPTION OF PROCEDURE:   After the risks and benefits and of the procedure were explained, informed consent was obtained.  A digital rectal exam revealed no abnormalities of the rectum.    The LB CF-[id number]  endoscope was introduced through the anus and advanced to the cecum, which was identified by both the appendix and ileocecal valve .  The quality of the prep was good, using MoviPrep .  The instrument was then slowly withdrawn as the colon was fully examined.     COLON FINDINGS: Mild diverticulosis was noted in the sigmoid colon. A polypoid shaped pedunculated polyp ranging between 5-18mm in size was found in the sigmoid colon.  A polypectomy was performed using snare cautery.  The resection was complete and the polyp tissue was completely retrieved.   The colon was otherwise normal.  There was no diverticulosis, inflammation, polyps or cancers unless previously stated.     Retroflexed views revealed no abnormalities. The scope was then withdrawn from the patient and the procedure completed.  COMPLICATIONS: There were no complications. ENDOSCOPIC IMPRESSION: 1.   Mild diverticulosis was noted in the sigmoid colon 2.   Pedunculated polyp ranging between 5-81mm in size was found in the sigmoid colon; polypectomy was performed using snare cautery 3.   The colon was otherwise normal  RECOMMENDATIONS: 1.  Await pathology results 2.  Repeat Colonoscopy in 5 years. 3.  High fiber diet   REPEAT  EXAM:  cc:Linda Hedges.  Plotnikov, MD  _______________________________ eSigned:  Mardella Layman, MD, [facility] 10/09/2012 11:30 AM

## 2012-10-09 NOTE — Progress Notes (Addendum)
Patient did not have preoperative order for IV antibiotic SSI prophylaxis. [phone]  Patient did not experience any of the following events: a burn prior to discharge; a fall within the facility; wrong site/side/patient/procedure/implant event; or a hospital transfer or hospital admission upon discharge from the facility. [phone])

## 2012-10-09 NOTE — Patient Instructions (Addendum)
YOU HAD AN ENDOSCOPIC PROCEDURE TODAY AT THE [facility]: Refer to the procedure report that was given to you for any specific questions about what was found during the examination.  If the procedure report does not answer your questions, please call your gastroenterologist to clarify.  If you requested that your care partner not be given the details of your procedure findings, then the procedure report has been included in a sealed envelope for you to review at your convenience later.  YOU SHOULD EXPECT: Some feelings of bloating in the abdomen. Passage of more gas than usual.  Walking can help get rid of the air that was put into your GI tract during the procedure and reduce the bloating. If you had a lower endoscopy (such as a colonoscopy or flexible sigmoidoscopy) you may notice spotting of blood in your stool or on the toilet paper. If you underwent a bowel prep for your procedure, then you may not have a normal bowel movement for a few days.  DIET: Your first meal following the procedure should be a light meal and then it is ok to progress to your normal diet.  A half-sandwich or bowl of soup is an example of a good first meal.  Heavy or fried foods are harder to digest and may make you feel nauseous or bloated.  Likewise meals heavy in dairy and vegetables can cause extra gas to form and this can also increase the bloating.  Drink plenty of fluids but you should avoid alcoholic beverages for 24 hours.  ACTIVITY: Your care partner should take you home directly after the procedure.  You should plan to take it easy, moving slowly for the rest of the day.  You can resume normal activity the day after the procedure however you should NOT DRIVE or use heavy machinery for 24 hours (because of the sedation medicines used during the test).    SYMPTOMS TO REPORT IMMEDIATELY: A gastroenterologist can be reached at any hour.  During normal business hours, 8:30 AM to 5:00 PM Monday through Friday,  call [phone].  After hours and on weekends, please call the GI answering service at [phone] who will take a message and have the physician on call contact you.   Following lower endoscopy (colonoscopy or flexible sigmoidoscopy):  Excessive amounts of blood in the stool  Significant tenderness or worsening of abdominal pains  Swelling of the abdomen that is new, acute  Fever of 100F or higher    FOLLOW UP: If any biopsies were taken you will be contacted by phone or by letter within the next 1-3 weeks.  Call your gastroenterologist if you have not heard about the biopsies in 3 weeks.  Our staff will call the home number listed on your records the next business day following your procedure to check on you and address any questions or concerns that you may have at that time regarding the information given to you following your procedure. This is a courtesy call and so if there is no answer at the home number and we have not heard from you through the emergency physician on call, we will assume that you have returned to your regular daily activities without incident.  SIGNATURES/CONFIDENTIALITY: You and/or your care partner have signed paperwork which will be entered into your electronic medical record.  These signatures attest to the fact that that the information above on your After Visit Summary has been reviewed and is understood.  Full responsibility of the confidentiality   of this discharge information lies with you and/or your care-partner.     

## 2012-10-09 NOTE — Progress Notes (Signed)
Called to room to assist during endoscopic procedure.  Patient ID and intended procedure confirmed with present staff. Received instructions for my participation in the procedure from the performing physician.  

## 2012-10-10 ENCOUNTER — Telehealth: Payer: Self-pay

## 2012-10-10 NOTE — Telephone Encounter (Signed)
Left message on answering machine. 

## 2012-10-16 ENCOUNTER — Encounter: Payer: Self-pay | Admitting: Gastroenterology

## 2012-10-16 ENCOUNTER — Encounter: Payer: Self-pay | Admitting: *Deleted

## 2012-12-05 ENCOUNTER — Ambulatory Visit (INDEPENDENT_AMBULATORY_CARE_PROVIDER_SITE_OTHER): Payer: Medicare Other | Admitting: Internal Medicine

## 2012-12-05 ENCOUNTER — Encounter: Payer: Self-pay | Admitting: Internal Medicine

## 2012-12-05 ENCOUNTER — Other Ambulatory Visit (INDEPENDENT_AMBULATORY_CARE_PROVIDER_SITE_OTHER): Payer: Medicare Other

## 2012-12-05 VITALS — BP 125/80 | HR 80 | Temp 98.0°F | Resp 16 | Ht 66.0 in | Wt 198.0 lb

## 2012-12-05 DIAGNOSIS — I10 Essential (primary) hypertension: Secondary | ICD-10-CM

## 2012-12-05 DIAGNOSIS — Z125 Encounter for screening for malignant neoplasm of prostate: Secondary | ICD-10-CM

## 2012-12-05 DIAGNOSIS — N320 Bladder-neck obstruction: Secondary | ICD-10-CM

## 2012-12-05 DIAGNOSIS — Z23 Encounter for immunization: Secondary | ICD-10-CM

## 2012-12-05 DIAGNOSIS — Z0000 Encounter for general adult medical examination without abnormal findings: Secondary | ICD-10-CM

## 2012-12-05 DIAGNOSIS — E785 Hyperlipidemia, unspecified: Secondary | ICD-10-CM

## 2012-12-05 DIAGNOSIS — D485 Neoplasm of uncertain behavior of skin: Secondary | ICD-10-CM

## 2012-12-05 DIAGNOSIS — N529 Male erectile dysfunction, unspecified: Secondary | ICD-10-CM

## 2012-12-05 DIAGNOSIS — H6123 Impacted cerumen, bilateral: Secondary | ICD-10-CM

## 2012-12-05 DIAGNOSIS — H6120 Impacted cerumen, unspecified ear: Secondary | ICD-10-CM | POA: Insufficient documentation

## 2012-12-05 DIAGNOSIS — R739 Hyperglycemia, unspecified: Secondary | ICD-10-CM

## 2012-12-05 DIAGNOSIS — Z136 Encounter for screening for cardiovascular disorders: Secondary | ICD-10-CM

## 2012-12-05 DIAGNOSIS — R7309 Other abnormal glucose: Secondary | ICD-10-CM

## 2012-12-05 LAB — BASIC METABOLIC PANEL
CO2: 34 mEq/L — ABNORMAL HIGH (ref 19–32)
Calcium: 9.9 mg/dL (ref 8.4–10.5)
Chloride: 102 mEq/L (ref 96–112)
Glucose, Bld: 108 mg/dL — ABNORMAL HIGH (ref 70–99)
Potassium: 3.9 mEq/L (ref 3.5–5.1)
Sodium: 143 mEq/L (ref 135–145)

## 2012-12-05 LAB — HEPATIC FUNCTION PANEL
AST: 42 U/L — ABNORMAL HIGH (ref 0–37)
Albumin: 4.5 g/dL (ref 3.5–5.2)
Alkaline Phosphatase: 97 U/L (ref 39–117)
Bilirubin, Direct: 0.2 mg/dL (ref 0.0–0.3)
Total Protein: 7.9 g/dL (ref 6.0–8.3)

## 2012-12-05 LAB — CBC WITH DIFFERENTIAL/PLATELET
Basophils Absolute: 0 10*3/uL (ref 0.0–0.1)
Eosinophils Relative: 1.4 % (ref 0.0–5.0)
HCT: 49.5 % (ref 39.0–52.0)
Lymphocytes Relative: 18.4 % (ref 12.0–46.0)
Lymphs Abs: 1.4 10*3/uL (ref 0.7–4.0)
Monocytes Relative: 6.7 % (ref 3.0–12.0)
Neutrophils Relative %: 73.2 % (ref 43.0–77.0)
Platelets: 209 10*3/uL (ref 150.0–400.0)
RDW: 14.7 % — ABNORMAL HIGH (ref 11.5–14.6)
WBC: 7.5 10*3/uL (ref 4.5–10.5)

## 2012-12-05 LAB — URINALYSIS, ROUTINE W REFLEX MICROSCOPIC
Bilirubin Urine: NEGATIVE
Ketones, ur: NEGATIVE
Nitrite: NEGATIVE
RBC / HPF: NONE SEEN (ref 0–?)
Specific Gravity, Urine: 1.01 (ref 1.000–1.030)
Urobilinogen, UA: 0.2 (ref 0.0–1.0)
pH: 7 (ref 5.0–8.0)

## 2012-12-05 LAB — LIPID PANEL
HDL: 52.1 mg/dL (ref >39.00)
Total CHOL/HDL Ratio: 4

## 2012-12-05 LAB — PSA: PSA: 2.26 ng/mL (ref 0.10–4.00)

## 2012-12-05 LAB — TSH: TSH: 2.25 u[IU]/mL (ref 0.35–5.50)

## 2012-12-05 NOTE — Assessment & Plan Note (Signed)
Here for medicare wellness/physical  Diet: heart healthy  Physical activity: not sedentary  Depression/mood screen: negative  Hearing: intact to whispered voice  Visual acuity: grossly normal, performs annual eye exam  ADLs: capable  Fall risk: none  Home safety: good  Cognitive evaluation: intact to orientation, naming, recall and repetition  EOL planning: adv directives, full code/ I agree  I have personally reviewed and have noted  1. The patient's medical and social history  2. Their use of alcohol, tobacco or illicit drugs  3. Their current medications and supplements  4. The patient's functional ability including ADL's, fall risks, home safety risks and hearing or visual impairment.  5. Diet and physical activities  6. Evidence for depression or mood disorders - none    Today patient counseled on age appropriate routine health concerns for screening and prevention, each reviewed and up to date or declined. Immunizations reviewed and up to date or declined. Labs ordered and reviewed. Risk factors for depression reviewed and negative. Hearing function and visual acuity are intact. ADLs screened and addressed as needed. Functional ability and level of safety reviewed and appropriate. Education, counseling and referrals performed based on assessed risks today. Patient provided with a copy of personalized plan for preventive services.

## 2012-12-05 NOTE — Progress Notes (Signed)
Subjective:   HPI  The patient is here for a wellness exam. The patient has been doing well overall without major physical or psychological issues going on lately.  The patient needs to address  chronic hypertension that has been well controlled with medicines; to address chronic  hyperlipidemia controlled with medicines as well   BP Readings from Last 3 Encounters:  12/05/12 140/90  10/09/12 129/80  06/06/12 130/90   Wt Readings from Last 3 Encounters:  12/05/12 198 lb (89.812 kg)  10/09/12 201 lb (91.173 kg)  09/25/12 201 lb 6.4 oz (91.354 kg)                          Review of Systems  Constitutional: Negative for appetite change, fatigue and unexpected weight change.  HENT: Negative for nosebleeds, congestion, sore throat, sneezing, trouble swallowing and neck pain.   Eyes: Negative for itching and visual disturbance.  Respiratory: Negative for cough.   Cardiovascular: Negative for chest pain, palpitations and leg swelling.  Gastrointestinal: Negative for nausea, diarrhea, blood in stool and abdominal distention.  Genitourinary: Negative for frequency and hematuria.  Musculoskeletal: Negative for back pain, joint swelling and gait problem.  Skin: Negative for rash.  Neurological: Negative for dizziness, tremors, speech difficulty and weakness.  Psychiatric/Behavioral: Negative for sleep disturbance, dysphoric mood and agitation. The patient is not nervous/anxious.        Objective:   Physical Exam  Constitutional: He is oriented to person, place, and time. He appears well-developed and well-nourished. No distress.  HENT:  Head: Normocephalic and atraumatic.  Right Ear: External ear normal.  Left Ear: External ear normal.  Nose: Nose normal.  Mouth/Throat: Oropharynx is clear and moist. No oropharyngeal exudate.  Eyes: Conjunctivae and EOM are normal. Pupils are equal, round, and reactive to light. Right eye exhibits no discharge. Left eye exhibits no discharge. No  scleral icterus.  Neck: Normal range of motion. Neck supple. No JVD present. No tracheal deviation present. No thyromegaly present.  Cardiovascular: Normal rate, regular rhythm, normal heart sounds and intact distal pulses.  Exam reveals no gallop and no friction rub.   No murmur heard. Pulmonary/Chest: Effort normal and breath sounds normal. No stridor. No respiratory distress. He has no wheezes. He has no rales. He exhibits no tenderness.  Abdominal: Soft. Bowel sounds are normal. He exhibits no distension and no mass. There is no tenderness. There is no rebound and no guarding.  Genitourinary: Rectum normal and prostate normal. Guaiac negative stool. Penile tenderness present.  Prostate 1+  Musculoskeletal: Normal range of motion. He exhibits no edema and no tenderness.  Lymphadenopathy:    He has no cervical adenopathy.  Neurological: He is alert and oriented to person, place, and time. He has normal reflexes. No cranial nerve deficit. He exhibits normal muscle tone. Coordination normal.  Skin: Skin is warm and dry. No rash noted. He is not diaphoretic. No erythema. No pallor.  Psychiatric: He has a normal mood and affect. His behavior is normal. Judgment and thought content normal.  B wax   Procedure Note :     Procedure :  Ear irrigation   Indication:  Cerumen impaction B   Risks, including pain, dizziness, eardrum perforation, bleeding, infection and others as well as benefits were explained to the patient in detail. Verbal consent was obtained and the patient agreed to proceed.    We used "The Elephant Ear Irrigation Device" filled with lukewarm water for irrigation. A large  amount wax was recovered. Procedure has also required manual wax removal with an ear loop.   Tolerated well. Complications: None.   Postprocedure instructions :  Call if problems.         Lab Results  Component Value Date   WBC 6.0 12/06/2011   HGB 15.7 12/06/2011   HCT 46.9 12/06/2011   PLT 207.0  12/06/2011   CHOL 175 07/06/2012   TRIG 106.0 07/06/2012   HDL 52.10 07/06/2012   ALT 55* 07/06/2012   AST 35 07/06/2012   NA 140 07/06/2012   K 3.9 07/06/2012   CL 101 07/06/2012   CREATININE 1.2 07/06/2012   BUN 22 07/06/2012   CO2 29 07/06/2012   TSH 2.43 12/06/2011   PSA 1.91 07/06/2012   HGBA1C 6.1 07/06/2012    Assessment & Plan:

## 2012-12-05 NOTE — Assessment & Plan Note (Signed)
Continue with current prescription therapy as reflected on the Med list.  

## 2012-12-05 NOTE — Assessment & Plan Note (Signed)
See procedure 

## 2012-12-05 NOTE — Assessment & Plan Note (Signed)
Skin bx 

## 2012-12-06 ENCOUNTER — Other Ambulatory Visit: Payer: Self-pay | Admitting: Internal Medicine

## 2012-12-08 ENCOUNTER — Encounter: Payer: Self-pay | Admitting: Internal Medicine

## 2012-12-08 ENCOUNTER — Ambulatory Visit (INDEPENDENT_AMBULATORY_CARE_PROVIDER_SITE_OTHER): Payer: Medicare Other | Admitting: Internal Medicine

## 2012-12-08 VITALS — BP 142/90 | HR 80 | Temp 98.2°F | Resp 16 | Wt 201.0 lb

## 2012-12-08 DIAGNOSIS — D485 Neoplasm of uncertain behavior of skin: Secondary | ICD-10-CM

## 2012-12-08 NOTE — Patient Instructions (Addendum)
Postprocedure instructions :    A Band-Aid should be  changed twice daily. You can take a shower tomorrow.  Keep the wounds clean. You can wash them with liquid soap and water. Pat dry with gauze or a Kleenex tissue  Before applying antibiotic ointment and a Band-Aid.   You need to report immediately  if fever, chills or any signs of infection develop.    The biopsy results should be available in 1 -2 weeks. 

## 2012-12-08 NOTE — Assessment & Plan Note (Signed)
Neck, chest See procedure

## 2012-12-08 NOTE — Progress Notes (Signed)
Patient ID: William [name], male   DOB: 08-23-1945, 67 y.o.   MRN: [id number]    Procedure Note :     Procedure :  Skin biopsy   Indication:  Changing mole (s ),  Suspicious lesion(s)   Risks including unsuccessful procedure , bleeding, infection, bruising, scar, a need for another complete procedure and others were explained to the patient in detail as well as the benefits. Informed consent was obtained and signed.   The patient was placed in a decubitus position.  Lesion #1 on L neck    measuring  12x6   mm   Skin over lesion #1  was prepped with Betadine and alcohol  and anesthetized with 1 cc of 2% lidocaine and epinephrine, using a 25-gauge 1 inch needle.  Shave biopsy with a sterile Dermablade was carried out in the usual fashion. Hyfrecator was used to destroy the rest of the lesion potentially left behind and for hemostasis. Band-Aid was applied with antibiotic ointment.    Lesion #2 on L neck below #1     measuring 11x6  mm   Skin over lesion #2  was prepped with Betadine and alcohol  and anesthetized with 1 cc of 2% lidocaine and epinephrine, using a 25-gauge 1 inch needle.  Shave biopsy with a sterile Dermablade was carried out in the usual fashion. Hyfrecator was used to destroy the rest of the lesion potentially left behind and for hemostasis. Band-Aid was applied with antibiotic ointment.  Lesion #3 on  L chest  measuring 6x4  mm   Skin over lesion #3  was prepped with Betadine and alcohol  and anesthetized with 1 cc of 2% lidocaine and epinephrine, using a 25-gauge 1 inch needle.  Shave biopsy with a sterile Dermablade was carried out in the usual fashion. Hyfrecator was used to destroy the rest of the lesion potentially left behind and for hemostasis. Band-Aid was applied with antibiotic ointment. #3 was discarded   Tolerated well. Complications none.

## 2012-12-10 ENCOUNTER — Encounter: Payer: Self-pay | Admitting: Internal Medicine

## 2012-12-12 ENCOUNTER — Ambulatory Visit
Admission: RE | Admit: 2012-12-12 | Discharge: 2012-12-12 | Disposition: A | Discharge status: Home / Self Care | Payer: Medicare Other | Source: Ambulatory Visit | Attending: Internal Medicine | Admitting: Internal Medicine

## 2013-01-31 ENCOUNTER — Other Ambulatory Visit: Payer: Self-pay

## 2013-05-03 ENCOUNTER — Other Ambulatory Visit: Payer: Self-pay

## 2013-06-04 ENCOUNTER — Other Ambulatory Visit (INDEPENDENT_AMBULATORY_CARE_PROVIDER_SITE_OTHER): Payer: Medicare Other

## 2013-06-04 DIAGNOSIS — R7989 Other specified abnormal findings of blood chemistry: Secondary | ICD-10-CM

## 2013-06-04 LAB — HEPATIC FUNCTION PANEL
AST: 33 U/L (ref 0–37)
Albumin: 4.5 g/dL (ref 3.5–5.2)
Alkaline Phosphatase: 89 U/L (ref 39–117)
Total Bilirubin: 1.1 mg/dL (ref 0.3–1.2)

## 2013-06-04 LAB — HEPATITIS C ANTIBODY: HCV Ab: NEGATIVE

## 2013-06-07 ENCOUNTER — Ambulatory Visit (INDEPENDENT_AMBULATORY_CARE_PROVIDER_SITE_OTHER): Payer: Medicare Other | Admitting: Internal Medicine

## 2013-06-07 ENCOUNTER — Encounter: Payer: Self-pay | Admitting: Internal Medicine

## 2013-06-07 VITALS — BP 122/84 | HR 76 | Temp 98.8°F | Resp 16 | Wt 187.0 lb

## 2013-06-07 DIAGNOSIS — R739 Hyperglycemia, unspecified: Secondary | ICD-10-CM

## 2013-06-07 DIAGNOSIS — I10 Essential (primary) hypertension: Secondary | ICD-10-CM

## 2013-06-07 DIAGNOSIS — N529 Male erectile dysfunction, unspecified: Secondary | ICD-10-CM

## 2013-06-07 DIAGNOSIS — Z23 Encounter for immunization: Secondary | ICD-10-CM

## 2013-06-07 DIAGNOSIS — R7309 Other abnormal glucose: Secondary | ICD-10-CM

## 2013-06-07 MED ORDER — VARDENAFIL HCL 20 MG PO TABS: 10.0000 mg | ORAL_TABLET | Freq: Every day | ORAL | Status: DC | PRN

## 2013-06-07 MED ORDER — ATORVASTATIN CALCIUM 40 MG PO TABS: 40.0000 mg | ORAL_TABLET | Freq: Every day | ORAL | Status: DC

## 2013-06-07 MED ORDER — POTASSIUM CHLORIDE CRYS ER 20 MEQ PO TBCR: 20.0000 meq | EXTENDED_RELEASE_TABLET | Freq: Every day | ORAL | Status: DC

## 2013-06-07 MED ORDER — LOSARTAN POTASSIUM-HCTZ 100-25 MG PO TABS: 1.0000 | ORAL_TABLET | Freq: Every day | ORAL | Status: DC

## 2013-06-07 NOTE — Assessment & Plan Note (Signed)
Better  

## 2013-06-07 NOTE — Patient Instructions (Signed)
Wt Readings from Last 3 Encounters:  06/07/13 187 lb (84.823 kg)  12/08/12 201 lb (91.173 kg)  12/05/12 198 lb (89.812 kg)   BP Readings from Last 3 Encounters:  06/07/13 122/84  12/08/12 142/90  12/05/12 125/80

## 2013-06-07 NOTE — Progress Notes (Signed)
Subjective:    HPI  F/u elevated LFTs, elevated glucose. Lost wt on diet  The patient needs to address  chronic hypertension that has been well controlled with medicines; to address chronic  hyperlipidemia controlled with medicines as well   BP Readings from Last 3 Encounters:  06/07/13 122/84  12/08/12 142/90  12/05/12 125/80   Wt Readings from Last 3 Encounters:  06/07/13 187 lb (84.823 kg)  12/08/12 201 lb (91.173 kg)  12/05/12 198 lb (89.812 kg)                          Review of Systems  Constitutional: Negative for appetite change, fatigue and unexpected weight change.  HENT: Negative for congestion, nosebleeds, sneezing, sore throat and trouble swallowing.   Eyes: Negative for itching and visual disturbance.  Respiratory: Negative for cough.   Cardiovascular: Negative for chest pain, palpitations and leg swelling.  Gastrointestinal: Negative for nausea, diarrhea, blood in stool and abdominal distention.  Genitourinary: Negative for frequency and hematuria.  Musculoskeletal: Negative for back pain, gait problem, joint swelling and neck pain.  Skin: Negative for rash.  Neurological: Negative for dizziness, tremors, speech difficulty and weakness.  Psychiatric/Behavioral: Negative for sleep disturbance, dysphoric mood and agitation. The patient is not nervous/anxious.        Objective:   Physical Exam  Constitutional: He is oriented to person, place, and time. He appears well-developed and well-nourished. No distress.  HENT:  Head: Normocephalic and atraumatic.  Right Ear: External ear normal.  Left Ear: External ear normal.  Nose: Nose normal.  Mouth/Throat: Oropharynx is clear and moist. No oropharyngeal exudate.  Eyes: Conjunctivae and EOM are normal. Pupils are equal, round, and reactive to light. Right eye exhibits no discharge. Left eye exhibits no discharge. No scleral icterus.  Neck: Normal range of motion. Neck supple. No JVD present. No tracheal  deviation present. No thyromegaly present.  Cardiovascular: Normal rate, regular rhythm, normal heart sounds and intact distal pulses.  Exam reveals no gallop and no friction rub.   No murmur heard. Pulmonary/Chest: Effort normal and breath sounds normal. No stridor. No respiratory distress. He has no wheezes. He has no rales. He exhibits no tenderness.  Abdominal: Soft. Bowel sounds are normal. He exhibits no distension and no mass. There is no tenderness. There is no rebound and no guarding.  Genitourinary: Rectum normal.  Musculoskeletal: Normal range of motion. He exhibits no edema and no tenderness.  Lymphadenopathy:    He has no cervical adenopathy.  Neurological: He is alert and oriented to person, place, and time. He has normal reflexes. No cranial nerve deficit. He exhibits normal muscle tone. Coordination normal.  Skin: Skin is warm and dry. No rash noted. He is not diaphoretic. No erythema. No pallor.  Psychiatric: He has a normal mood and affect. His behavior is normal. Judgment and thought content normal.         Lab Results  Component Value Date   WBC 7.5 12/05/2012   HGB 16.7 12/05/2012   HCT 49.5 12/05/2012   PLT 209.0 12/05/2012   CHOL 184 12/05/2012   TRIG 143.0 12/05/2012   HDL 52.10 12/05/2012   ALT 39 06/04/2013   AST 33 06/04/2013   NA 143 12/05/2012   K 3.9 12/05/2012   CL 102 12/05/2012   CREATININE 1.2 12/05/2012   BUN 19 12/05/2012   CO2 34* 12/05/2012   TSH 2.25 12/05/2012   PSA 2.26 12/05/2012   HGBA1C  6.1 07/06/2012    Assessment & Plan:

## 2013-06-07 NOTE — Progress Notes (Signed)
Pre visit review using our clinic review tool, if applicable. No additional management support is needed unless otherwise documented below in the visit note. 

## 2013-06-07 NOTE — Assessment & Plan Note (Signed)
Continue with current prescription therapy as reflected on the Med list.  

## 2013-07-17 ENCOUNTER — Other Ambulatory Visit: Payer: Self-pay | Admitting: Internal Medicine

## 2013-12-06 ENCOUNTER — Encounter: Payer: Self-pay | Admitting: Internal Medicine

## 2013-12-06 ENCOUNTER — Ambulatory Visit (INDEPENDENT_AMBULATORY_CARE_PROVIDER_SITE_OTHER): Payer: Medicare Other | Admitting: Internal Medicine

## 2013-12-06 ENCOUNTER — Ambulatory Visit: Payer: Medicare Other | Admitting: Internal Medicine

## 2013-12-06 ENCOUNTER — Other Ambulatory Visit (INDEPENDENT_AMBULATORY_CARE_PROVIDER_SITE_OTHER): Payer: Medicare Other

## 2013-12-06 ENCOUNTER — Inpatient Hospital Stay
Admission: RE | Admit: 2013-12-06 | Discharge: 2013-12-06 | Disposition: A | Discharge status: Home / Self Care | Payer: Self-pay | Source: Ambulatory Visit | Attending: Internal Medicine | Admitting: Internal Medicine

## 2013-12-06 ENCOUNTER — Ambulatory Visit (INDEPENDENT_AMBULATORY_CARE_PROVIDER_SITE_OTHER)
Admission: RE | Admit: 2013-12-06 | Discharge: 2013-12-06 | Disposition: A | Discharge status: Home / Self Care | Payer: Medicare Other | Source: Ambulatory Visit | Attending: Internal Medicine | Admitting: Internal Medicine

## 2013-12-06 VITALS — BP 140/92 | HR 68 | Temp 98.5°F | Resp 16 | Wt 192.0 lb

## 2013-12-06 DIAGNOSIS — M25551 Pain in right hip: Secondary | ICD-10-CM

## 2013-12-06 DIAGNOSIS — I10 Essential (primary) hypertension: Secondary | ICD-10-CM

## 2013-12-06 DIAGNOSIS — L570 Actinic keratosis: Secondary | ICD-10-CM

## 2013-12-06 DIAGNOSIS — E785 Hyperlipidemia, unspecified: Secondary | ICD-10-CM

## 2013-12-06 DIAGNOSIS — M25559 Pain in unspecified hip: Secondary | ICD-10-CM

## 2013-12-06 LAB — BASIC METABOLIC PANEL
BUN: 17 mg/dL (ref 6–23)
CO2: 31 meq/L (ref 19–32)
Calcium: 9.4 mg/dL (ref 8.4–10.5)
Chloride: 104 mEq/L (ref 96–112)
Creatinine, Ser: 1.2 mg/dL (ref 0.4–1.5)
GFR: 64.17 mL/min (ref >60.00)
Glucose, Bld: 105 mg/dL — ABNORMAL HIGH (ref 70–99)
Potassium: 3.9 mEq/L (ref 3.5–5.1)
SODIUM: 144 meq/L (ref 135–145)

## 2013-12-06 LAB — LIPID PANEL
Cholesterol: 160 mg/dL (ref 0–200)
HDL: 62.7 mg/dL (ref >39.00)
LDL Cholesterol: 82 mg/dL (ref 0–99)
NONHDL: 97.3
Total CHOL/HDL Ratio: 3
Triglycerides: 78 mg/dL (ref 0.0–149.0)
VLDL: 15.6 mg/dL (ref 0.0–40.0)

## 2013-12-06 LAB — HEPATIC FUNCTION PANEL
ALBUMIN: 4.3 g/dL (ref 3.5–5.2)
ALK PHOS: 92 U/L (ref 39–117)
ALT: 46 U/L (ref 0–53)
AST: 32 U/L (ref 0–37)
Bilirubin, Direct: 0.2 mg/dL (ref 0.0–0.3)
Total Bilirubin: 0.8 mg/dL (ref 0.2–1.2)
Total Protein: 7.5 g/dL (ref 6.0–8.3)

## 2013-12-06 LAB — TSH: TSH: 2.09 u[IU]/mL (ref 0.35–4.50)

## 2013-12-06 NOTE — Assessment & Plan Note (Signed)
Continue with current prescription therapy as reflected on the Med list. Labs  

## 2013-12-06 NOTE — Assessment & Plan Note (Signed)
2015 h/o remote pelvic fx - a fork lift accident

## 2013-12-06 NOTE — Assessment & Plan Note (Signed)
Continue with current prescription therapy as reflected on the Med list.  

## 2013-12-06 NOTE — Assessment & Plan Note (Signed)
Cryo  

## 2013-12-06 NOTE — Patient Instructions (Signed)
Postprocedure instructions :     Keep the wounds clean. You can wash them with liquid soap and water. Pat dry with gauze or a Kleenex tissue  Before applying antibiotic ointment and a Band-Aid.   You need to report immediately  if  any signs of infection develop.   Trochanteric bursitis  Hip openers

## 2013-12-06 NOTE — Progress Notes (Signed)
Subjective:    HPI  C/o R hip pain worse w/sitting (running is ok) h/o remote pelvic fx - a fork lift accident  F/u elevated LFTs, elevated glucose. Lost wt on diet  The patient needs to address  chronic hypertension that has been well controlled with medicines; to address chronic  hyperlipidemia controlled with medicines as well   BP Readings from Last 3 Encounters:  12/06/13 140/92  06/07/13 122/84  12/08/12 142/90   Wt Readings from Last 3 Encounters:  12/06/13 192 lb (87.091 kg)  06/07/13 187 lb (84.823 kg)  12/08/12 201 lb (91.173 kg)                          Review of Systems  Constitutional: Negative for appetite change, fatigue and unexpected weight change.  HENT: Negative for congestion, nosebleeds, sneezing, sore throat and trouble swallowing.   Eyes: Negative for itching and visual disturbance.  Respiratory: Negative for cough.   Cardiovascular: Negative for chest pain, palpitations and leg swelling.  Gastrointestinal: Negative for nausea, diarrhea, blood in stool and abdominal distention.  Genitourinary: Negative for frequency and hematuria.  Musculoskeletal: Negative for back pain, gait problem, joint swelling and neck pain.  Skin: Negative for rash.  Neurological: Negative for dizziness, tremors, speech difficulty and weakness.  Psychiatric/Behavioral: Negative for sleep disturbance, dysphoric mood and agitation. The patient is not nervous/anxious.        Objective:   Physical Exam  Constitutional: He is oriented to person, place, and time. He appears well-developed and well-nourished. No distress.  HENT:  Head: Normocephalic and atraumatic.  Right Ear: External ear normal.  Left Ear: External ear normal.  Nose: Nose normal.  Mouth/Throat: Oropharynx is clear and moist. No oropharyngeal exudate.  Eyes: Conjunctivae and EOM are normal. Pupils are equal, round, and reactive to light. Right eye exhibits no discharge. Left eye exhibits no discharge. No  scleral icterus.  Neck: Normal range of motion. Neck supple. No JVD present. No tracheal deviation present. No thyromegaly present.  Cardiovascular: Normal rate, regular rhythm, normal heart sounds and intact distal pulses.  Exam reveals no gallop and no friction rub.   No murmur heard. Pulmonary/Chest: Effort normal and breath sounds normal. No stridor. No respiratory distress. He has no wheezes. He has no rales. He exhibits no tenderness.  Abdominal: Soft. Bowel sounds are normal. He exhibits no distension and no mass. There is no tenderness. There is no rebound and no guarding.  Genitourinary: Rectum normal.  Musculoskeletal: Normal range of motion. He exhibits no edema and no tenderness.  Lymphadenopathy:    He has no cervical adenopathy.  Neurological: He is alert and oriented to person, place, and time. He has normal reflexes. No cranial nerve deficit. He exhibits normal muscle tone. Coordination normal.  Skin: Skin is warm and dry. No rash noted. He is not diaphoretic. No erythema. No pallor.  Psychiatric: He has a normal mood and affect. His behavior is normal. Judgment and thought content normal.  AKs       Lab Results  Component Value Date   WBC 7.5 12/05/2012   HGB 16.7 12/05/2012   HCT 49.5 12/05/2012   PLT 209.0 12/05/2012   CHOL 184 12/05/2012   TRIG 143.0 12/05/2012   HDL 52.10 12/05/2012   ALT 39 06/04/2013   AST 33 06/04/2013   NA 143 12/05/2012   K 3.9 12/05/2012   CL 102 12/05/2012   CREATININE 1.2 12/05/2012   BUN 19  12/05/2012   CO2 34* 12/05/2012   TSH 2.25 12/05/2012   PSA 2.26 12/05/2012   HGBA1C 6.1 07/06/2012    Procedure Note :     Procedure : Cryosurgery   Indication:  Wart(s)  Actinic keratosis(es)   Risks including unsuccessful procedure , bleeding, infection, bruising, scar, a need for a repeat  procedure and others were explained to the patient in detail as well as the benefits. Informed consent was obtained verbally.    12 lesion(s)  on face   was/were  treated with liquid nitrogen on a Q-tip in a usual fasion . Band-Aid was applied and antibiotic ointment was given for a later use.   Tolerated well. Complications none.   Postprocedure instructions :     Keep the wounds clean. You can wash them with liquid soap and water. Pat dry with gauze or a Kleenex tissue  Before applying antibiotic ointment and a Band-Aid.   You need to report immediately  if  any signs of infection develop.    Assessment & Plan:

## 2013-12-06 NOTE — Progress Notes (Signed)
Pre visit review using our clinic review tool, if applicable. No additional management support is needed unless otherwise documented below in the visit note. 

## 2013-12-07 ENCOUNTER — Telehealth: Payer: Self-pay | Admitting: Internal Medicine

## 2013-12-07 NOTE — Telephone Encounter (Signed)
Relevant patient education assigned to patient using Emmi. ° °

## 2014-04-15 ENCOUNTER — Other Ambulatory Visit: Payer: Self-pay | Admitting: Internal Medicine

## 2014-05-26 ENCOUNTER — Other Ambulatory Visit: Payer: Self-pay | Admitting: Internal Medicine

## 2014-06-04 ENCOUNTER — Encounter: Payer: Medicare Other | Admitting: Internal Medicine

## 2014-06-12 ENCOUNTER — Encounter: Payer: Medicare Other | Admitting: Internal Medicine

## 2014-07-08 ENCOUNTER — Encounter: Payer: Self-pay | Admitting: Internal Medicine

## 2014-07-08 ENCOUNTER — Ambulatory Visit (INDEPENDENT_AMBULATORY_CARE_PROVIDER_SITE_OTHER): Payer: Medicare Other | Admitting: Internal Medicine

## 2014-07-08 ENCOUNTER — Other Ambulatory Visit (INDEPENDENT_AMBULATORY_CARE_PROVIDER_SITE_OTHER): Payer: Medicare Other

## 2014-07-08 VITALS — BP 150/94 | HR 69 | Temp 97.7°F | Ht 66.0 in | Wt 201.0 lb

## 2014-07-08 DIAGNOSIS — Z0000 Encounter for general adult medical examination without abnormal findings: Secondary | ICD-10-CM

## 2014-07-08 DIAGNOSIS — E785 Hyperlipidemia, unspecified: Secondary | ICD-10-CM

## 2014-07-08 DIAGNOSIS — I10 Essential (primary) hypertension: Secondary | ICD-10-CM

## 2014-07-08 DIAGNOSIS — R000 Tachycardia, unspecified: Secondary | ICD-10-CM | POA: Insufficient documentation

## 2014-07-08 DIAGNOSIS — R002 Palpitations: Secondary | ICD-10-CM

## 2014-07-08 DIAGNOSIS — N320 Bladder-neck obstruction: Secondary | ICD-10-CM

## 2014-07-08 LAB — BASIC METABOLIC PANEL
BUN: 19 mg/dL (ref 6–23)
CALCIUM: 9.8 mg/dL (ref 8.4–10.5)
CHLORIDE: 102 meq/L (ref 96–112)
CO2: 30 mEq/L (ref 19–32)
Creatinine, Ser: 1.1 mg/dL (ref 0.4–1.5)
GFR: 73.92 mL/min (ref >60.00)
Glucose, Bld: 111 mg/dL — ABNORMAL HIGH (ref 70–99)
Potassium: 4.1 mEq/L (ref 3.5–5.1)
Sodium: 142 mEq/L (ref 135–145)

## 2014-07-08 LAB — CBC WITH DIFFERENTIAL/PLATELET
Basophils Absolute: 0 10*3/uL (ref 0.0–0.1)
Basophils Relative: 0.5 % (ref 0.0–3.0)
Eosinophils Absolute: 0.1 10*3/uL (ref 0.0–0.7)
Eosinophils Relative: 1.7 % (ref 0.0–5.0)
HCT: 46.3 % (ref 39.0–52.0)
Hemoglobin: 15.7 g/dL (ref 13.0–17.0)
LYMPHS ABS: 1.9 10*3/uL (ref 0.7–4.0)
LYMPHS PCT: 24.5 % (ref 12.0–46.0)
MCHC: 33.8 g/dL (ref 30.0–36.0)
MCV: 88.2 fl (ref 78.0–100.0)
MONO ABS: 0.5 10*3/uL (ref 0.1–1.0)
MONOS PCT: 6 % (ref 3.0–12.0)
NEUTROS ABS: 5.1 10*3/uL (ref 1.4–7.7)
Neutrophils Relative %: 67.3 % (ref 43.0–77.0)
PLATELETS: 189 10*3/uL (ref 150.0–400.0)
RBC: 5.25 Mil/uL (ref 4.22–5.81)
RDW: 14.1 % (ref 11.5–15.5)
WBC: 7.6 10*3/uL (ref 4.0–10.5)

## 2014-07-08 LAB — URINALYSIS
BILIRUBIN URINE: NEGATIVE
HGB URINE DIPSTICK: NEGATIVE
Ketones, ur: NEGATIVE
LEUKOCYTES UA: NEGATIVE
NITRITE: NEGATIVE
Specific Gravity, Urine: >=1.03 — AB (ref 1.000–1.030)
TOTAL PROTEIN, URINE-UPE24: NEGATIVE
URINE GLUCOSE: NEGATIVE
Urobilinogen, UA: 0.2 (ref 0.0–1.0)
pH: 5.5 (ref 5.0–8.0)

## 2014-07-08 LAB — HEPATIC FUNCTION PANEL
ALK PHOS: 100 U/L (ref 39–117)
ALT: 48 U/L (ref 0–53)
AST: 39 U/L — ABNORMAL HIGH (ref 0–37)
Albumin: 4.7 g/dL (ref 3.5–5.2)
Bilirubin, Direct: 0.1 mg/dL (ref 0.0–0.3)
Total Bilirubin: 1 mg/dL (ref 0.2–1.2)
Total Protein: 7.8 g/dL (ref 6.0–8.3)

## 2014-07-08 LAB — PSA: PSA: 2.04 ng/mL (ref 0.10–4.00)

## 2014-07-08 LAB — LIPID PANEL
Cholesterol: 201 mg/dL — ABNORMAL HIGH (ref 0–200)
HDL: 53.4 mg/dL (ref >39.00)
LDL Cholesterol: 112 mg/dL — ABNORMAL HIGH (ref 0–99)
NONHDL: 147.6
Total CHOL/HDL Ratio: 4
Triglycerides: 178 mg/dL — ABNORMAL HIGH (ref 0.0–149.0)
VLDL: 35.6 mg/dL (ref 0.0–40.0)

## 2014-07-08 LAB — TSH: TSH: 2.49 u[IU]/mL (ref 0.35–4.50)

## 2014-07-08 MED ORDER — ATENOLOL 50 MG PO TABS
50.0000 mg | ORAL_TABLET | Freq: Every day | ORAL | Status: DC
Start: 2014-07-08 — End: 2014-10-01

## 2014-07-08 MED ORDER — ATENOLOL 50 MG PO TABS: 50.0000 mg | ORAL_TABLET | Freq: Every day | ORAL | Status: DC

## 2014-07-08 NOTE — Assessment & Plan Note (Signed)
BP Readings from Last 3 Encounters:  07/08/14 150/94  12/06/13 140/92  06/07/13 122/84  A Beta-blocker was added

## 2014-07-08 NOTE — Assessment & Plan Note (Signed)
Here for medicare wellness/physical  Diet: heart healthy  Physical activity: not sedentary, active Depression/mood screen: negative  Hearing: intact to whispered voice  Visual acuity: grossly normal, performs annual eye exam  ADLs: capable  Fall risk: none  Home safety: good  Cognitive evaluation: intact to orientation, naming, recall and repetition  EOL planning: adv directives, full code/ I agree  I have personally reviewed and have noted  1. The patient's medical and social history  2. Their use of alcohol, tobacco or illicit drugs  3. Their current medications and supplements  4. The patient's functional ability including ADL's, fall risks, home safety risks and hearing or visual impairment.  5. Diet and physical activities  6. Evidence for depression or mood disorders - none    Today patient counseled on age appropriate routine health concerns for screening and prevention, each reviewed and up to date or declined. Immunizations reviewed and up to date or declined. Labs ordered and reviewed. Risk factors for depression reviewed and negative. Hearing function and visual acuity are intact. ADLs screened and addressed as needed. Functional ability and level of safety reviewed and appropriate. Education, counseling and referrals performed based on assessed risks today. Patient provided with a copy of personalized plan for preventive services.

## 2014-07-08 NOTE — Assessment & Plan Note (Signed)
Continue with current prescription therapy as reflected on the Med list.  

## 2014-07-08 NOTE — Progress Notes (Signed)
Pre visit review using our clinic review tool, if applicable. No additional management support is needed unless otherwise documented below in the visit note. 

## 2014-07-08 NOTE — Progress Notes (Signed)
  Subjective:   HPI  The patient is here for a wellness exam.   The patient is c/o an episode of  Rapid heart beat HR 170 x 1 hr (1 wk ago while watching basketball). There was no CP. No LOC. HR was regular. Pt uses no caffeine... The patient needs to address  chronic hypertension that has been well controlled with medicines; to address chronic  hyperlipidemia controlled with medicines as well   BP Readings from Last 3 Encounters:  12/06/13 140/92  06/07/13 122/84  12/08/12 142/90   Wt Readings from Last 3 Encounters:  12/06/13 192 lb (87.091 kg)  06/07/13 187 lb (84.823 kg)  12/08/12 201 lb (91.173 kg)                          Review of Systems  Constitutional: Negative for appetite change, fatigue and unexpected weight change.  HENT: Negative for congestion, nosebleeds, sneezing, sore throat and trouble swallowing.   Eyes: Negative for itching and visual disturbance.  Respiratory: Negative for cough.   Cardiovascular: Negative for chest pain, palpitations and leg swelling.  Gastrointestinal: Negative for nausea, diarrhea, blood in stool and abdominal distention.  Genitourinary: Negative for frequency and hematuria.  Musculoskeletal: Negative for back pain, joint swelling, gait problem and neck pain.  Skin: Negative for rash.  Neurological: Negative for dizziness, tremors, speech difficulty and weakness.  Psychiatric/Behavioral: Negative for sleep disturbance, dysphoric mood and agitation. The patient is not nervous/anxious.        Objective:   Physical Exam  Constitutional: He is oriented to person, place, and time. He appears well-developed. No distress.  NAD  HENT:  Mouth/Throat: Oropharynx is clear and moist.  Eyes: Conjunctivae are normal. Pupils are equal, round, and reactive to light.  Neck: Normal range of motion. No JVD present. No thyromegaly present.  Cardiovascular: Normal rate, regular rhythm, normal heart sounds and intact distal pulses.  Exam reveals no  gallop and no friction rub.   No murmur heard. Pulmonary/Chest: Effort normal and breath sounds normal. No respiratory distress. He has no wheezes. He has no rales. He exhibits no tenderness.  Abdominal: Soft. Bowel sounds are normal. He exhibits no distension and no mass. There is no tenderness. There is no rebound and no guarding.  Musculoskeletal: Normal range of motion. He exhibits no edema or tenderness.  Lymphadenopathy:    He has no cervical adenopathy.  Neurological: He is alert and oriented to person, place, and time. He has normal reflexes. No cranial nerve deficit. He exhibits normal muscle tone. He displays a negative Romberg sign. Coordination and gait normal.  No meningeal signs  Skin: Skin is warm and dry. No rash noted.  Psychiatric: He has a normal mood and affect. His behavior is normal. Judgment and thought content normal.  B wax   EKG NSR      Lab Results  Component Value Date   WBC 7.5 12/05/2012   HGB 16.7 12/05/2012   HCT 49.5 12/05/2012   PLT 209.0 12/05/2012   CHOL 160 12/06/2013   TRIG 78.0 12/06/2013   HDL 62.70 12/06/2013   ALT 46 12/06/2013   AST 32 12/06/2013   NA 144 12/06/2013   K 3.9 12/06/2013   CL 104 12/06/2013   CREATININE 1.2 12/06/2013   BUN 17 12/06/2013   CO2 31 12/06/2013   TSH 2.09 12/06/2013   PSA 2.26 12/05/2012   HGBA1C 6.1 07/06/2012    Assessment & Plan:

## 2014-07-08 NOTE — Assessment & Plan Note (Addendum)
1/16 PSVT vs other x1 episode w/HR=170 Start Atenolol at [facility] Cardiol consult EKG, Labs

## 2014-07-08 NOTE — Patient Instructions (Signed)

## 2014-07-11 ENCOUNTER — Encounter: Payer: Self-pay | Admitting: Internal Medicine

## 2014-07-12 ENCOUNTER — Telehealth: Payer: Self-pay | Admitting: *Deleted

## 2014-07-12 ENCOUNTER — Telehealth: Payer: Self-pay | Admitting: Internal Medicine

## 2014-07-12 NOTE — Telephone Encounter (Signed)
[facility] - Client [place] [facility] Patient Name: William [name] Gender: Male DOB: [birth year] Age: 69 Y 59 M 5 D Return Phone Number: [phone] (Primary) Address: City/State/Zip: [place] Client [facility] - Client Client Site [place] - Day Physician Plotnikov, Alex Contact Type Call Call Type Triage / Clinical Relationship To Patient Self Appointment Disposition EMR Appointment Attempted - Not Scheduled Return Phone Number [phone] (Primary) Chief Complaint Heart palpitations or irregular heartbeat Initial Comment caller states his heart rate is in the low 40's PreDisposition Call Doctor Nurse Assessment Nurse: Ronnald Ramp, RN, Miranda Date/Time (Eastern Time): 07/12/2014 9:48:13 AM Confirm and document reason for call. If symptomatic, describe symptoms. ---Caller states every morning the last week his heart rates has been in the low 40's (normal HR is low 60's). Recently started on Atenolol because of slight eleveated BP and he had an episode of increased heart rate a couple of weeks ago. He is not having symptoms and has been able to continue on with normal activity including running 3 miles in the morning. Has the patient traveled out of the country within the last 30 days? ---Not Applicable Does the patient require triage? ---Yes Related visit to physician within the last 2 weeks? ---Yes Does the PT have any chronic conditions? (i.e. diabetes, asthma, etc.) ---Yes List chronic conditions. ---HTN, High Cholesterol Guidelines Guideline Title Affirmed Question Affirmed Notes Nurse Date/Time Eilene Ghazi Time) Heart Rate and Heartbeat Questions Palpitations (all triage questions negative) Ronnald Ramp, RN, Miranda 07/12/2014 9:51:17 AM Disp. Time Eilene Ghazi Time) Disposition Final User 07/12/2014 9:44:21 AM Send To Clinical Follow Up Queue [facility] 07/12/2014 9:55:05 AM Home Care Yes Ronnald Ramp,  RN, Judge Stall Understands: Yes Disagree/Comply: Comply PLEASE NOTE: All timestamps contained within this report are represented as [place] Standard Time. CONFIDENTIALTY NOTICE: This fax transmission is intended only for the addressee. It contains information that is legally privileged, confidential or otherwise protected from use or disclosure. If you are not the intended recipient, you are strictly prohibited from reviewing, disclosing, copying using or disseminating any of this information or taking any action in reliance on or regarding this information. If you have received this fax in error, please notify us immediately by telephone so that we can arrange for its return to [place]. Phone: [phone], Toll-Free: [phone], Fax: [phone] Page: 2 of 2 Call Id: [id number] Care Advice Given Per Guideline HOME CARE: You should be able to treat this at home. REASSURANCE: Everybody experiences palpitations at some point in their lives. In many circumstances it is simply a heightened awareness of the heart's normal beating. Patients with anxiety or stress may describe a 'rapid heart beat' or 'pounding' in their chest from their heart beating. CALL BACK IF: * Chest pain, lightheadedness or difficulty breathing occurs * You become worse. CARE ADVICE given per Palpitations (Adult) guideline. After Care Instructions Given Call Event Type User Date / Time Description Comments User: Leverne Humbles, RN Date/Time Eilene Ghazi Time): 07/12/2014 10:05:20 AM Told caller to continue with prescribed dose of medication unless he hears something from the office. Told caller I would send a message to the office for follow up.

## 2014-07-12 NOTE — Telephone Encounter (Signed)
Stop Atenolol Thx

## 2014-07-12 NOTE — Telephone Encounter (Signed)
PLEASE NOTE: All timestamps contained within this report are represented as [place] Standard Time. CONFIDENTIALTY NOTICE: This fax transmission is intended only for the addressee. It contains information that is legally privileged, confidential or otherwise protected from use or disclosure. If you are not the intended recipient, you are strictly prohibited from reviewing, disclosing, copying using or disseminating any of this information or taking any action in reliance on or regarding this information. If you have received this fax in error, please notify us immediately by telephone so that we can arrange for its return to [place]. Phone: [phone], Toll-Free: [phone], Fax: [phone] Page: 1 of 1 Call Id: [id number] [place] [facility] - Client [place] Patient Name: William [name] DOB: 1945/11/28 Nurse Assessment Nurse: Ronnald Ramp, RN, Miranda Date/Time (Eastern Time): 07/12/2014 9:48:13 AM Confirm and document reason for call. If symptomatic, describe symptoms. ---Caller states every morning the last week his heart rates has been in the low 40's (normal HR is low 60's). Recently started on Atenolol because of slight eleveated BP and he had an episode of increased heart rate a couple of weeks ago. He is not having symptoms and has been able to continue on with normal activity including running 3 miles in the morning. Has the patient traveled out of the country within the last 30 days? ---Not Applicable Does the patient require triage? ---Yes Related visit to physician within the last 2 weeks? ---Yes Does the PT have any chronic conditions? (i.e. diabetes, asthma, etc.) ---Yes List chronic conditions. ---HTN, High Cholesterol Guidelines Guideline Title Affirmed Question Affirmed Notes Heart Rate and Heartbeat Questions Palpitations (all triage questions negative) Final Disposition User Sevier, RN, Miranda Comments Told  caller to continue with prescribed dose of medication unless he hears something from the office. Told caller I would send a message to the office for follow up.

## 2014-07-12 NOTE — Telephone Encounter (Signed)
Notified pt with md response.../lmb 

## 2014-08-01 ENCOUNTER — Ambulatory Visit: Payer: Medicare Other | Admitting: Cardiology

## 2014-08-23 ENCOUNTER — Other Ambulatory Visit: Payer: Self-pay | Admitting: Internal Medicine

## 2014-08-29 DIAGNOSIS — R002 Palpitations: Secondary | ICD-10-CM | POA: Diagnosis not present

## 2014-08-30 ENCOUNTER — Encounter: Payer: Self-pay | Admitting: Cardiovascular Disease

## 2014-08-30 ENCOUNTER — Ambulatory Visit (INDEPENDENT_AMBULATORY_CARE_PROVIDER_SITE_OTHER): Payer: Medicare Other | Admitting: Cardiovascular Disease

## 2014-08-30 DIAGNOSIS — R011 Cardiac murmur, unspecified: Secondary | ICD-10-CM

## 2014-08-30 DIAGNOSIS — R002 Palpitations: Secondary | ICD-10-CM

## 2014-08-30 NOTE — Patient Instructions (Signed)
Your physician has recommended that you wear an event monitor. Event monitors are medical devices that record the heart's electrical activity. Doctors most often [place] these monitors to diagnose arrhythmias. Arrhythmias are problems with the speed or rhythm of the heartbeat. The monitor is a small, portable device. You can wear one while you do your normal daily activities. This is usually used to diagnose what is causing palpitations/syncope (passing out).  Your physician has requested that you have an echocardiogram. Echocardiography is a painless test that uses sound waves to create images of your heart. It provides your doctor with information about the size and shape of your heart and how well your heart's chambers and valves are working. This procedure takes approximately one hour. There are no restrictions for this procedure.   Your physician has requested that you have an exercise tolerance test. For further information please visit [email or web address]. Please also follow instruction sheet, as given.  Your physician recommends that you schedule a follow-up appointment in: 6 weeks with Dr. Sallyanne Kuster.

## 2014-08-30 NOTE — Progress Notes (Signed)
Cardiology Office Note   Date:  08/30/2014   ID:  Duke, Weisensel [birth year], MRN [id number]  PCP:  Walker Kehr, MD  Cardiologist:   Sanda Klein, MD   Chief Complaint  Patient presents with  . NP-CONSULT    palpitations once. none since.      History of Present Illness: William [name] is a 69 y.o. male who presents for evaluation of a 30 minute episode of rapid regular palpitations that occurred in early January. He was feeling well and had just gone to bed when he felt fairly abrupt onset of the palpitations. They did not associate dyspnea, angina or presyncope. He checked his blood pressure with an automatic manometer and found that it was high and also recorded a heart rate in the 170s-180s. The rapid palpitations seemed to gradually resolve.  He was prescribed atenolol by Dr. Alain Marion. After taking 25 mg he noticed his morning heart rate was in the high 30s. This made him feel sluggish. Even taking 12.5 mg still cause bradycardia and fatigue. The atenolol was subsequently stopped.  He has a long-standing history of obstructive sleep apnea and is 100% compliant with CPAP. He has treated systemic hypertension and hyperlipidemia. He does not have a history of coronary or peripheral vascular problems that we are aware of. He does not have a history of stroke/TIA or other embolic events or bleeding problems.  He exercises 5 days a week at the [facility], a combination of walking and sprinting. He has noticed that his heart rate can increase even as high as 200 beats a minute during exercise. There is no family history of unexplained sudden death, arrhythmia, cardiomyopathy or premature CAD.  Past Medical History  Diagnosis Date  . Colon polyps     Dr Sharlett Iles  . Hyperlipidemia   . HTN (hypertension)   . GERD (gastroesophageal reflux disease)   . OSA on CPAP   . Prostatitis     Past Surgical History  Procedure Laterality Date  . Rotator cuff repair  2008    left; Dr  Para March  . Appendectomy  2012    09/2010 in [place]     Current Outpatient Prescriptions  Medication Sig Dispense Refill  . aspirin 81 MG EC tablet Take 81 mg by mouth daily.      Marland Kitchen atenolol (TENORMIN) 50 MG tablet Take 1 tablet (50 mg total) by mouth daily. Take at hs 30 tablet 0  . atorvastatin (LIPITOR) 40 MG tablet Take 1 tablet by mouth  daily 90 tablet 3  . Cholecalciferol 1000 UNITS tablet Take 1,000 Units by mouth daily.      Marland Kitchen losartan-hydrochlorothiazide (HYZAAR) 100-25 MG per tablet Take 1 tablet by mouth daily. 90 tablet 3  . losartan-hydrochlorothiazide (HYZAAR) 100-25 MG per tablet Take 1 tablet by mouth  daily 90 tablet 3  . potassium chloride SA (K-DUR,KLOR-CON) 20 MEQ tablet Take 1 tablet by mouth  daily 90 tablet 2  . vardenafil (LEVITRA) 20 MG tablet Take 0.5-1 tablets (10-20 mg total) by mouth daily as needed. 21 tablet 3   No current facility-administered medications for this visit.    Allergies:   Benazepril hcl    Social History:  The patient  reports that he has never smoked. He has never used smokeless tobacco. He reports that he does not drink alcohol or use illicit drugs.   Family History:  The patient's family history includes Asthma in his son; Coronary artery disease in his mother; Heart disease (age  of onset: 64) in his father and mother; Hypertension in his other; Stroke in his sister. There is no history of Colon cancer.    ROS:  Please see the history of present illness.   Otherwise, review of systems are positive for none.   All other systems are reviewed and negative.   The patient specifically denies any chest pain at rest or with exertion, dyspnea at rest or with exertion, orthopnea, paroxysmal nocturnal dyspnea, syncope, focal neurological deficits, intermittent claudication, lower extremity edema, unexplained weight gain, cough, hemoptysis or wheezing.  The patient also denies abdominal pain, nausea, vomiting, dysphagia, diarrhea, constipation,  polyuria, polydipsia, dysuria, hematuria, frequency, urgency, abnormal bleeding or bruising, fever, chills, unexpected weight changes, mood swings, change in skin or hair texture, change in voice quality, auditory or visual problems, allergic reactions or rashes, new musculoskeletal complaints other than usual "aches and pains".    PHYSICAL EXAM: VS:  BP 142/70 mmHg  Pulse 88  Ht 5\' 6"  (1.676 m)  Wt 201 lb 14.4 oz (91.581 kg)  BMI 32.60 kg/m2 , BMI Body mass index is 32.6 kg/(m^2).  General: Alert, oriented x3, no distress Head: no evidence of trauma, PERRL, EOMI, no exophtalmos or lid lag, no myxedema, no xanthelasma; normal ears, nose and oropharynx Neck: normal jugular venous pulsations and no hepatojugular reflux; brisk carotid pulses without delay and no carotid bruits Chest: clear to auscultation, no signs of consolidation by percussion or palpation, normal fremitus, symmetrical and full respiratory excursions Cardiovascular: normal position and quality of the apical impulse, regular rhythm, normal first and second heart sounds, early peaking grade 1-6/1 systolic ejection murmur heard best at the left lower sternal border no diastolic murmurs, rubs or gallops Abdomen: no tenderness or distention, no masses by palpation, no abnormal pulsatility or arterial bruits, normal bowel sounds, no hepatosplenomegaly Extremities: no clubbing, cyanosis or edema; 2+ radial, ulnar and brachial pulses bilaterally; 2+ right femoral, posterior tibial and dorsalis pedis pulses; 2+ left femoral, posterior tibial and dorsalis pedis pulses; no subclavian or femoral bruits Neurological: grossly nonfocal    EKG:  EKG is not ordered today. The ekg performed in January shows normal sinus rhythm and nonspecific T-wave flattening and a QTc interval of 427 ms   Recent Labs: 07/08/2014: ALT 48; BUN 19; Creatinine 1.1; Hemoglobin 15.7; Platelets 189.0; Potassium 4.1; Sodium 142; TSH 2.49    Lipid Panel      Component Value Date/Time   CHOL 201* 07/08/2014 1157   TRIG 178.0* 07/08/2014 1157   TRIG 67 05/03/2006 0753   HDL 53.40 07/08/2014 1157   CHOLHDL 4 07/08/2014 1157   CHOLHDL 3.3 CALC 05/03/2006 0753   VLDL 35.6 07/08/2014 1157   LDLCALC 112* 07/08/2014 1157      Wt Readings from Last 3 Encounters:  08/30/14 201 lb 14.4 oz (91.581 kg)  07/08/14 201 lb (91.173 kg)  12/06/13 192 lb (87.091 kg)      ASSESSMENT AND PLAN:  1.  Rapid palpitations - these were abrupt in onset but gradual and termination. He believes the rhythm was very regular. Since he has obstructive sleep apnea he is at high risk of having paroxysmal atrial tachycardia or atrial flutter. It would be important to identify the latter since he would meet an indication for anticoagulation. We'll try to see if we can catch the arrhythmia on an event monitor. For the time being I would not prescribe the treatment for this relatively well-tolerated single event  2. Cardiac murmur - the presence or absence  of structural heart disease will make a big difference in treatment of his palpitations. I have recommended an echocardiogram.  3. Multiple coronary risk factors - he has mild mixed hyperlipidemia despite treatment with a statin, hypertension and mild obesity. It is very reasonable to perform a treadmill stress test, although I doubt that his recent palpitations would be a indication for a coronary event.  4. Obstructive sleep apnea - reports good compliance with CPAP   Current medicines are reviewed at length with the patient today.  The patient does not have concerns regarding medicines.  The following changes have been made:  no change  Labs/ tests ordered today include:  Orders Placed This Encounter  Procedures  . Exercise Tolerance Test  . Cardiac event monitor  . 2D Echocardiogram without contrast     Disposition:   FU with me in 5-6 weeks   Mikael Spray, MD  08/30/2014 9:07 AM    [facility] [address], [place], [place]  [address] Phone: [phone]; Fax: [phone]

## 2014-09-13 ENCOUNTER — Telehealth: Payer: Self-pay

## 2014-09-18 NOTE — Telephone Encounter (Signed)
Pt declined flu vaccine

## 2014-09-24 ENCOUNTER — Telehealth: Payer: Self-pay | Admitting: Cardiovascular Disease

## 2014-09-24 NOTE — Telephone Encounter (Signed)
William [name] has a abnormal EKG to report

## 2014-09-24 NOTE — Telephone Encounter (Signed)
Received a call from [place] with [facility] calling to report new onset atrial fib rate 168 bpm will fax strips.

## 2014-09-24 NOTE — Telephone Encounter (Signed)
Received monitor strips.Dr.Croitoru reviewed strips which revealed atrial fib.Dr.Croitoru advised patient will need appointment to discuss.Appointment scheduled 10/01/14 at 3:00 pm.Patient called and was told monitor revealed atrial fib.Advised of appointment with Dr.Croitoru.

## 2014-09-25 ENCOUNTER — Telehealth (HOSPITAL_COMMUNITY): Payer: Self-pay

## 2014-09-25 NOTE — Telephone Encounter (Signed)
Encounter complete. 

## 2014-09-26 ENCOUNTER — Ambulatory Visit (HOSPITAL_COMMUNITY)
Admission: RE | Admit: 2014-09-26 | Discharge: 2014-09-26 | Disposition: A | Discharge status: Home / Self Care | Payer: Medicare Other | Source: Ambulatory Visit | Attending: Internal Medicine | Admitting: Internal Medicine

## 2014-09-26 DIAGNOSIS — R011 Cardiac murmur, unspecified: Secondary | ICD-10-CM | POA: Insufficient documentation

## 2014-09-26 DIAGNOSIS — I10 Essential (primary) hypertension: Secondary | ICD-10-CM | POA: Diagnosis not present

## 2014-09-26 DIAGNOSIS — E785 Hyperlipidemia, unspecified: Secondary | ICD-10-CM | POA: Diagnosis not present

## 2014-09-26 DIAGNOSIS — R002 Palpitations: Secondary | ICD-10-CM

## 2014-09-26 DIAGNOSIS — Z8249 Family history of ischemic heart disease and other diseases of the circulatory system: Secondary | ICD-10-CM | POA: Insufficient documentation

## 2014-09-26 NOTE — Progress Notes (Signed)
2D Echocardiogram Complete.  09/26/2014   Sharbel Sahagun Fairway, RDCS

## 2014-09-27 ENCOUNTER — Ambulatory Visit (HOSPITAL_COMMUNITY)
Admission: RE | Admit: 2014-09-27 | Discharge: 2014-09-27 | Disposition: A | Discharge status: Home / Self Care | Payer: Medicare Other | Source: Ambulatory Visit | Attending: Cardiovascular Disease | Admitting: Cardiovascular Disease

## 2014-09-27 DIAGNOSIS — R002 Palpitations: Secondary | ICD-10-CM | POA: Diagnosis not present

## 2014-09-27 DIAGNOSIS — R011 Cardiac murmur, unspecified: Secondary | ICD-10-CM

## 2014-09-27 NOTE — Procedures (Signed)
Exercise Treadmill Test  Test  Exercise Tolerance Test Ordering MD: Sanda Klein, MD    Unique Test No: 1  Treadmill:  1  Indication for ETT: Palpitations  Contraindication to ETT: No   Stress Modality: exercise - treadmill  Cardiac Imaging Performed: non   Protocol: standard Bruce - maximal  Max BP:  175/82  Max MPHR (bpm):  153 85% MPR (bpm):  130  MPHR obtained (bpm):  153 % MPHR obtained:  100  Reached 85% MPHR (min:sec):  3:30 Total Exercise Time (min-sec):  7  Workload in METS:  8.5 Borg Scale: 15  Reason ETT Terminated:  Mild Fatigue and Maximal HR obtained    ST Segment Analysis At Rest: normal ST segments - no evidence of significant ST depression With Exercise: significant ischemic ST depression  Other Information Arrhythmia:  No Angina during ETT:  absent (0) Quality of ETT:  diagnostic  ETT Interpretation:  abnormal - evidence of ST depression consistent with ischemia  Comments: 1-2 mm inferior "Scooped" ST depressions and 3 mm anteroseptal ST depressions in V2,V3, and V4 at peak exercise No chest pain Fair exercise tolerance Normal BP response to exercise Normal heart rate and BP recovery  Pixie Casino, MD, [facility] Attending Cardiologist [place]

## 2014-10-01 ENCOUNTER — Ambulatory Visit (INDEPENDENT_AMBULATORY_CARE_PROVIDER_SITE_OTHER): Payer: Medicare Other | Admitting: Cardiovascular Disease

## 2014-10-01 ENCOUNTER — Encounter: Payer: Self-pay | Admitting: Cardiovascular Disease

## 2014-10-01 VITALS — BP 140/92 | HR 77 | Ht 66.0 in | Wt 200.4 lb

## 2014-10-01 DIAGNOSIS — I10 Essential (primary) hypertension: Secondary | ICD-10-CM

## 2014-10-01 DIAGNOSIS — R001 Bradycardia, unspecified: Secondary | ICD-10-CM

## 2014-10-01 DIAGNOSIS — I4949 Other premature depolarization: Secondary | ICD-10-CM | POA: Diagnosis not present

## 2014-10-01 DIAGNOSIS — I480 Paroxysmal atrial fibrillation: Secondary | ICD-10-CM | POA: Diagnosis not present

## 2014-10-01 DIAGNOSIS — I4891 Unspecified atrial fibrillation: Secondary | ICD-10-CM | POA: Diagnosis not present

## 2014-10-01 MED ORDER — APIXABAN 5 MG PO TABS: 5.0000 mg | ORAL_TABLET | Freq: Two times a day (BID) | ORAL | Status: DC

## 2014-10-01 NOTE — Progress Notes (Signed)
Patient ID: William [name], male   DOB: 07/11/1945, 69 y.o.   MRN: [id number]     Cardiology Office Note   Date:  10/02/2014   ID:  Geo, Slone 12/14/1945, MRN [id number]  PCP:  Walker Kehr, MD  Cardiologist:   Sanda Klein, MD   Chief Complaint  Patient presents with  . Follow-up    6 weeks:  Echo/GXT/Event Monitor results.  No complaints of chest pain, SOB, edema or dizziness.  No palpitations since 09/23/14.      History of Present Illness: William [name] is a 69 y.o. male who presents for follow up on multiple cardiac tests. Echo and stress test showed excellent results, but he had atrial fibrillation with RVR recorded on his event monitor, corresponding to his presenting clinical complaint of rapid irregular palpitations. The echo did show mild RV enlargement, likely OSA related. He had repeated nocturnal episodes of severe bradycardia in the 30s, including periods of junctional rhythm , but no diurnal bradycardia and normal heart rate response to exercise.  He has a long-standing history of obstructive sleep apnea and is 100% compliant with CPAP. He has treated systemic hypertension and hyperlipidemia. He does not have a history of coronary or peripheral vascular problems that we are aware of. He does not have a history of stroke/TIA or other embolic events or bleeding problems. He exercises regularly.  Past Medical History  Diagnosis Date  . Colon polyps     Dr Sharlett Iles  . Hyperlipidemia   . HTN (hypertension)   . GERD (gastroesophageal reflux disease)   . OSA on CPAP   . Prostatitis     Past Surgical History  Procedure Laterality Date  . Rotator cuff repair  2008    left; Dr Para March  . Appendectomy  2012    09/2010 in [place]     Current Outpatient Prescriptions  Medication Sig Dispense Refill  . apixaban (ELIQUIS) 5 MG TABS tablet Take 1 tablet (5 mg total) by mouth 2 (two) times daily. 180 tablet 2  . apixaban (ELIQUIS) 5 MG TABS tablet Take 1 tablet (5  mg total) by mouth 2 (two) times daily. 60 tablet 0  . atorvastatin (LIPITOR) 40 MG tablet Take 1 tablet by mouth  daily 90 tablet 3  . Cholecalciferol 1000 UNITS tablet Take 1,000 Units by mouth daily.      Marland Kitchen losartan-hydrochlorothiazide (HYZAAR) 100-25 MG per tablet Take 1 tablet by mouth daily. 90 tablet 3  . losartan-hydrochlorothiazide (HYZAAR) 100-25 MG per tablet Take 1 tablet by mouth  daily 90 tablet 3  . potassium chloride SA (K-DUR,KLOR-CON) 20 MEQ tablet Take 1 tablet by mouth  daily 90 tablet 2  . vardenafil (LEVITRA) 20 MG tablet Take 0.5-1 tablets (10-20 mg total) by mouth daily as needed. 21 tablet 3   No current facility-administered medications for this visit.    Allergies:   Benazepril hcl    Social History:  The patient  reports that he has never smoked. He has never used smokeless tobacco. He reports that he does not drink alcohol or use illicit drugs.   Family History:  The patient's family history includes Asthma in his son; Coronary artery disease in his mother; Heart disease (age of onset: 58) in his father and mother; Hypertension in his other; Stroke in his sister. There is no history of Colon cancer.    ROS:  Please see the history of present illness.    Otherwise, review of systems positive for none.  All other systems are reviewed and negative.    PHYSICAL EXAM: VS:  BP 140/92 mmHg  Pulse 77  Ht 5\' 6"  (1.676 m)  Wt 200 lb 6.4 oz (90.901 kg)  BMI 32.36 kg/m2 , BMI Body mass index is 32.36 kg/(m^2).  General: Alert, oriented x3, no distress Head: no evidence of trauma, PERRL, EOMI, no exophtalmos or lid lag, no myxedema, no xanthelasma; normal ears, nose and oropharynx Neck: normal jugular venous pulsations and no hepatojugular reflux; brisk carotid pulses without delay and no carotid bruits Chest: clear to auscultation, no signs of consolidation by percussion or palpation, normal fremitus, symmetrical and full respiratory excursions Cardiovascular:  normal position and quality of the apical impulse, regular rhythm, normal first and second heart sounds, no rubs or gallops, early peaking grade 6-2/8 systolic ejection murmur heard best at the left lower sternal border no diastolic murmurs Abdomen: no tenderness or distention, no masses by palpation, no abnormal pulsatility or arterial bruits, normal bowel sounds, no hepatosplenomegaly Extremities: no clubbing, cyanosis or edema; 2+ radial, ulnar and brachial pulses bilaterally; 2+ right femoral, posterior tibial and dorsalis pedis pulses; 2+ left femoral, posterior tibial and dorsalis pedis pulses; no subclavian or femoral bruits Neurological: grossly nonfocal Psych: euthymic mood, full affect   EKG:  EKG is not ordered today.   Recent Labs: 07/08/2014: ALT 48; BUN 19; Creatinine 1.1; Hemoglobin 15.7; Platelets 189.0; Potassium 4.1; Sodium 142; TSH 2.49    Lipid Panel    Component Value Date/Time   CHOL 201* 07/08/2014 1157   TRIG 178.0* 07/08/2014 1157   TRIG 67 05/03/2006 0753   HDL 53.40 07/08/2014 1157   CHOLHDL 4 07/08/2014 1157   CHOLHDL 3.3 CALC 05/03/2006 0753   VLDL 35.6 07/08/2014 1157   LDLCALC 112* 07/08/2014 1157      Wt Readings from Last 3 Encounters:  10/01/14 200 lb 6.4 oz (90.901 kg)  08/30/14 201 lb 14.4 oz (91.581 kg)  07/08/14 201 lb (91.173 kg)      ASSESSMENT AND PLAN:  Had a long discussion regarding AFib and implications regarding embolic risk and rate control versus rhythm control strategies.  Recommend he start a DOAC, stop ASA.  He has marked bradycardia at night and was poorly tolerant of beta blocker empirical therapy. He may have some degree of sinus node dysfunction. Also consider need to revisit his CPAP prescription, although he is very compliant with CPAP.  I don't think he will do well with scheduled rate control meds. These may precipitate the need for a pacemaker. Antiarrhythmics are not justified with his very mild symptoms, as their  risks would outweigh the benefits.  May consider a "pill in the pocket" approach with prn rate control meds if his PAF episodes become lengthier.   Current medicines are reviewed at length with the patient today.  The patient does not have concerns regarding medicines.  The following changes have been made: start Eliquis and stop ASA  Labs/ tests ordered today include:  Orders Placed This Encounter  Procedures  . EKG 12-Lead   Patient Instructions  STOP Aspirin .  START Eliquis 5mg  take twice daily.  Coupon card for 30 day free trial with prescription given.  A Rx has been sent to your mail order pharmacy for a 90 day supply.  Dr. Sallyanne Kuster recommends that you schedule a follow-up appointment in: 2 months.     Mikael Spray, MD  10/02/2014 8:43 PM    Sanda Klein, MD, [facility] [phone] office [phone] pager

## 2014-10-01 NOTE — Patient Instructions (Addendum)
STOP Aspirin .  START Eliquis 5mg  take twice daily.  Coupon card for 30 day free trial with prescription given.  A Rx has been sent to your mail order pharmacy for a 90 day supply.  Dr. Sallyanne Kuster recommends that you schedule a follow-up appointment in: 2 months.

## 2014-10-04 ENCOUNTER — Telehealth: Payer: Self-pay | Admitting: *Deleted

## 2014-10-04 NOTE — Telephone Encounter (Signed)
Stress test results discussed at OV with patient.

## 2014-10-04 NOTE — Telephone Encounter (Signed)
-----   Message from Sanda Klein, MD sent at 10/03/2014  1:64 PM EDT ----- Clicked on his stress test "done" before sending to you.. Can you document that we reviewed results at his office visit, please?

## 2014-10-07 ENCOUNTER — Telehealth: Payer: Self-pay | Admitting: *Deleted

## 2014-10-07 NOTE — Telephone Encounter (Signed)
PA faxed to [facility] for Eliquis 5mg  bid [phone].

## 2014-10-08 ENCOUNTER — Ambulatory Visit (INDEPENDENT_AMBULATORY_CARE_PROVIDER_SITE_OTHER): Payer: Medicare Other | Admitting: Internal Medicine

## 2014-10-08 ENCOUNTER — Encounter: Payer: Self-pay | Admitting: Internal Medicine

## 2014-10-08 VITALS — BP 130/90 | HR 102 | Wt 197.0 lb

## 2014-10-08 DIAGNOSIS — I10 Essential (primary) hypertension: Secondary | ICD-10-CM | POA: Diagnosis not present

## 2014-10-08 DIAGNOSIS — E785 Hyperlipidemia, unspecified: Secondary | ICD-10-CM | POA: Diagnosis not present

## 2014-10-08 DIAGNOSIS — I480 Paroxysmal atrial fibrillation: Secondary | ICD-10-CM

## 2014-10-08 DIAGNOSIS — L570 Actinic keratosis: Secondary | ICD-10-CM | POA: Diagnosis not present

## 2014-10-08 NOTE — Progress Notes (Signed)
  Subjective:   HPI    F/u PAF The patient needs to address  chronic hypertension that has been well controlled with medicines; to address chronic  hyperlipidemia controlled with medicines as well  C/o skin lesions on face   BP Readings from Last 3 Encounters:  10/08/14 130/90  10/01/14 140/92  08/30/14 142/70   Wt Readings from Last 3 Encounters:  10/08/14 197 lb (89.359 kg)  10/01/14 200 lb 6.4 oz (90.901 kg)  08/30/14 201 lb 14.4 oz (91.581 kg)                          Review of Systems  Constitutional: Negative for appetite change, fatigue and unexpected weight change.  HENT: Negative for congestion, nosebleeds, sneezing, sore throat and trouble swallowing.   Eyes: Negative for itching and visual disturbance.  Respiratory: Negative for cough.   Cardiovascular: Negative for chest pain, palpitations and leg swelling.  Gastrointestinal: Negative for nausea, diarrhea, blood in stool and abdominal distention.  Genitourinary: Negative for frequency and hematuria.  Musculoskeletal: Negative for back pain, joint swelling, gait problem and neck pain.  Skin: Negative for rash.  Neurological: Negative for dizziness, tremors, speech difficulty and weakness.  Psychiatric/Behavioral: Negative for sleep disturbance, dysphoric mood and agitation. The patient is not nervous/anxious.        Objective:   Physical Exam  Constitutional: He is oriented to person, place, and time. He appears well-developed. No distress.  NAD  HENT:  Mouth/Throat: Oropharynx is clear and moist.  Eyes: Conjunctivae are normal. Pupils are equal, round, and reactive to light.  Neck: Normal range of motion. No JVD present. No thyromegaly present.  Cardiovascular: Normal rate, regular rhythm, normal heart sounds and intact distal pulses.  Exam reveals no gallop and no friction rub.   No murmur heard. Pulmonary/Chest: Effort normal and breath sounds normal. No respiratory distress. He has no wheezes. He has no  rales. He exhibits no tenderness.  Abdominal: Soft. Bowel sounds are normal. He exhibits no distension and no mass. There is no tenderness. There is no rebound and no guarding.  Musculoskeletal: Normal range of motion. He exhibits no edema or tenderness.  Lymphadenopathy:    He has no cervical adenopathy.  Neurological: He is alert and oriented to person, place, and time. He has normal reflexes. No cranial nerve deficit. He exhibits normal muscle tone. He displays a negative Romberg sign. Coordination and gait normal.  Skin: Skin is warm and dry. No rash noted.  Psychiatric: He has a normal mood and affect. His behavior is normal. Judgment and thought content normal.  AKs       Lab Results  Component Value Date   WBC 7.6 07/08/2014   HGB 15.7 07/08/2014   HCT 46.3 07/08/2014   PLT 189.0 07/08/2014   CHOL 201* 07/08/2014   TRIG 178.0* 07/08/2014   HDL 53.40 07/08/2014   ALT 48 07/08/2014   AST 39* 07/08/2014   NA 142 07/08/2014   K 4.1 07/08/2014   CL 102 07/08/2014   CREATININE 1.1 07/08/2014   BUN 19 07/08/2014   CO2 30 07/08/2014   TSH 2.49 07/08/2014   PSA 2.04 07/08/2014   HGBA1C 6.1 07/06/2012    Assessment & Plan:

## 2014-10-08 NOTE — Assessment & Plan Note (Signed)
Sch skin bx/cryo

## 2014-10-08 NOTE — Progress Notes (Signed)
Pre visit review using our clinic review tool, if applicable. No additional management support is needed unless otherwise documented below in the visit note. 

## 2014-10-08 NOTE — Assessment & Plan Note (Signed)
4/16 On Eliquis

## 2014-10-08 NOTE — Assessment & Plan Note (Signed)
Lipitor 

## 2014-10-08 NOTE — Assessment & Plan Note (Signed)
Losartan HCT 

## 2014-10-14 ENCOUNTER — Ambulatory Visit: Payer: Medicare Other | Admitting: Cardiovascular Disease

## 2014-10-14 ENCOUNTER — Ambulatory Visit: Payer: Medicare Other | Admitting: Internal Medicine

## 2014-10-14 ENCOUNTER — Encounter: Payer: Self-pay | Admitting: Internal Medicine

## 2014-10-14 VITALS — BP 110/80 | HR 84 | Wt 197.0 lb

## 2014-10-14 DIAGNOSIS — D489 Neoplasm of uncertain behavior, unspecified: Secondary | ICD-10-CM

## 2014-10-14 NOTE — Progress Notes (Signed)
Pre visit review using our clinic review tool, if applicable. No additional management support is needed unless otherwise documented below in the visit note. 

## 2014-10-15 ENCOUNTER — Telehealth: Payer: Self-pay | Admitting: *Deleted

## 2014-10-15 NOTE — Telephone Encounter (Signed)
PA appeal for Eliquis approved 10/04/2014  through 10/08/2015.

## 2014-12-09 ENCOUNTER — Ambulatory Visit (INDEPENDENT_AMBULATORY_CARE_PROVIDER_SITE_OTHER): Payer: Medicare Other | Admitting: Cardiovascular Disease

## 2014-12-09 ENCOUNTER — Encounter: Payer: Self-pay | Admitting: Cardiovascular Disease

## 2014-12-09 VITALS — BP 142/84 | HR 68 | Ht 72.0 in | Wt 196.2 lb

## 2014-12-09 DIAGNOSIS — I10 Essential (primary) hypertension: Secondary | ICD-10-CM | POA: Diagnosis not present

## 2014-12-09 DIAGNOSIS — I480 Paroxysmal atrial fibrillation: Secondary | ICD-10-CM

## 2014-12-09 DIAGNOSIS — R001 Bradycardia, unspecified: Secondary | ICD-10-CM | POA: Diagnosis not present

## 2014-12-09 DIAGNOSIS — I4949 Other premature depolarization: Secondary | ICD-10-CM | POA: Diagnosis not present

## 2014-12-09 NOTE — Patient Instructions (Signed)
Dr. Croitoru recommends that you schedule a follow-up appointment in: 6 months    

## 2014-12-09 NOTE — Progress Notes (Signed)
Patient ID: CLAIBORNE STROBLE, male   DOB: 21-Oct-1945, 69 y.o.   MRN: [id number]     Cardiology Office Note   Date:  12/09/2014   ID:  Emmit, Oriley 09-17-1945, MRN [id number]  PCP:  Walker Kehr, MD  Cardiologist:   Sanda Klein, MD   Chief Complaint  Patient presents with  . Follow-up    2 months:  No complaints of chest pain, SOB,edema or dizziness.      History of Present Illness: DEEPAK BLESS is a 69 y.o. male who presents for follow-up for recently diagnosed atrial fibrillation and initiation of anticoagulation therapy. He has returned from his trip to [place] and did not have any events while there. He had one episode of irregular palpitations last Thursday that lasted for only 5-10 minutes and was not associated with tachycardia or any other cardiac complaints. He has virtually eliminated his caffeine intake. He never drinks alcohol. He is compliant with CPAP. He is mildly overweight with a BMI of 26.6. He has not had any bleeding problems or any bruising with Eliquis.  He is not on any rate control medications since he has marked bradycardia at night including junctional escape rhythm. He was very poorly tolerant of empirical beta blocker therapy. It is likely that he has some degree of sinus node dysfunction, but once beta blockers were stopped he has no symptoms to lead to a recommendation of pacemaker therapy.    Past Medical History  Diagnosis Date  . Colon polyps     Dr Sharlett Iles  . Hyperlipidemia   . HTN (hypertension)   . GERD (gastroesophageal reflux disease)   . OSA on CPAP   . Prostatitis     Past Surgical History  Procedure Laterality Date  . Rotator cuff repair  2008    left; Dr Para March  . Appendectomy  2012    09/2010 in [place]     Current Outpatient Prescriptions  Medication Sig Dispense Refill  . apixaban (ELIQUIS) 5 MG TABS tablet Take 1 tablet (5 mg total) by mouth 2 (two) times daily. 180 tablet 2  . atorvastatin (LIPITOR) 40 MG tablet Take  1 tablet by mouth  daily 90 tablet 3  . Cholecalciferol 1000 UNITS tablet Take 1,000 Units by mouth daily.      Marland Kitchen losartan-hydrochlorothiazide (HYZAAR) 100-25 MG per tablet Take 1 tablet by mouth daily. 90 tablet 3  . potassium chloride SA (K-DUR,KLOR-CON) 20 MEQ tablet Take 1 tablet by mouth  daily 90 tablet 2  . vardenafil (LEVITRA) 20 MG tablet Take 0.5-1 tablets (10-20 mg total) by mouth daily as needed. 21 tablet 3   No current facility-administered medications for this visit.    Allergies:   Benazepril hcl    Social History:  The patient  reports that he has never smoked. He has never used smokeless tobacco. He reports that he does not drink alcohol or use illicit drugs.   Family History:  The patient's family history includes Asthma in his son; Coronary artery disease in his mother; Heart disease (age of onset: 9) in his father and mother; Hypertension in his other; Stroke in his sister. There is no history of Colon cancer.    ROS:  Please see the history of present illness.    Otherwise, review of systems positive for none.   All other systems are reviewed and negative.    PHYSICAL EXAM: VS:  BP 142/84 mmHg  Pulse 68  Ht 6' (1.829 m)  Wt 88.996 kg (  196 lb 3.2 oz)  BMI 26.60 kg/m2 , BMI Body mass index is 26.6 kg/(m^2).  General: Alert, oriented x3, no distress Head: no evidence of trauma, PERRL, EOMI, no exophtalmos or lid lag, no myxedema, no xanthelasma; normal ears, nose and oropharynx Neck: normal jugular venous pulsations and no hepatojugular reflux; brisk carotid pulses without delay and no carotid bruits Chest: clear to auscultation, no signs of consolidation by percussion or palpation, normal fremitus, symmetrical and full respiratory excursions Cardiovascular: normal position and quality of the apical impulse, regular rhythm, normal first and second heart sounds, no murmurs, rubs or gallops Abdomen: no tenderness or distention, no masses by palpation, no abnormal  pulsatility or arterial bruits, normal bowel sounds, no hepatosplenomegaly Extremities: no clubbing, cyanosis or edema; 2+ radial, ulnar and brachial pulses bilaterally; 2+ right femoral, posterior tibial and dorsalis pedis pulses; 2+ left femoral, posterior tibial and dorsalis pedis pulses; no subclavian or femoral bruits Neurological: grossly nonfocal Psych: euthymic mood, full affect   EKG:  EKG is not ordered today.   Recent Labs: 07/08/2014: ALT 48; BUN 19; Creatinine, Ser 1.1; Hemoglobin 15.7; Platelets 189.0; Potassium 4.1; Sodium 142; TSH 2.49    Lipid Panel    Component Value Date/Time   CHOL 201* 07/08/2014 1157   TRIG 178.0* 07/08/2014 1157   TRIG 67 05/03/2006 0753   HDL 53.40 07/08/2014 1157   CHOLHDL 4 07/08/2014 1157   CHOLHDL 3.3 CALC 05/03/2006 0753   VLDL 35.6 07/08/2014 1157   LDLCALC 112* 07/08/2014 1157      Wt Readings from Last 3 Encounters:  12/09/14 88.996 kg (196 lb 3.2 oz)  10/14/14 89.359 kg (197 lb)  10/08/14 89.359 kg (197 lb)      ASSESSMENT AND PLAN:  Mr. Passage has infrequent, brief and minimally symptomatic episodes of paroxysmal atrial fibrillation. He is tolerating anticoagulation therapy without any complications. He is not receiving either rate control or rhythm control medications since he has a background tendency to sinus bradycardia. We discussed the importance of a healthy weight and compliance with CPAP to reduce the likelihood of atrial fibrillation events. He also reviewed the natural history of atrial fibrillation, likely to become more of a burden as he gets older. CHADSVasc 2 (age, HTN).   Current medicines are reviewed at length with the patient today.  The patient does not have concerns regarding medicines.  The following changes have been made:  no change  Labs/ tests ordered today include:  No orders of the defined types were placed in this encounter.     Patient Instructions  Dr. Sallyanne Kuster recommends that you schedule  a follow-up appointment in: 6 months.       Mikael Spray, MD  12/09/2014 9:12 AM    Sanda Klein, MD, [facility] [phone] office [phone] pager

## 2015-01-05 ENCOUNTER — Other Ambulatory Visit: Payer: Self-pay | Admitting: Internal Medicine

## 2015-04-05 ENCOUNTER — Other Ambulatory Visit: Payer: Self-pay | Admitting: Internal Medicine

## 2015-04-08 ENCOUNTER — Ambulatory Visit: Payer: Medicare Other | Admitting: Internal Medicine

## 2015-04-14 ENCOUNTER — Telehealth: Payer: Self-pay

## 2015-04-14 NOTE — Telephone Encounter (Signed)
Spoke to spouse regarding AWV and declined at this time. STates he sees cardiology and Dr. Camila Li and feels he does not need wellness review at this time

## 2015-05-05 ENCOUNTER — Encounter: Payer: Self-pay | Admitting: Internal Medicine

## 2015-05-05 ENCOUNTER — Other Ambulatory Visit (INDEPENDENT_AMBULATORY_CARE_PROVIDER_SITE_OTHER): Payer: Medicare Other

## 2015-05-05 ENCOUNTER — Ambulatory Visit (INDEPENDENT_AMBULATORY_CARE_PROVIDER_SITE_OTHER): Payer: Medicare Other | Admitting: Internal Medicine

## 2015-05-05 VITALS — BP 136/80 | HR 84 | Wt 199.0 lb

## 2015-05-05 DIAGNOSIS — R972 Elevated prostate specific antigen [PSA]: Secondary | ICD-10-CM | POA: Diagnosis not present

## 2015-05-05 DIAGNOSIS — I10 Essential (primary) hypertension: Secondary | ICD-10-CM

## 2015-05-05 DIAGNOSIS — L570 Actinic keratosis: Secondary | ICD-10-CM | POA: Diagnosis not present

## 2015-05-05 DIAGNOSIS — I480 Paroxysmal atrial fibrillation: Secondary | ICD-10-CM

## 2015-05-05 LAB — CBC WITH DIFFERENTIAL/PLATELET
BASOS PCT: 0.7 % (ref 0.0–3.0)
Basophils Absolute: 0 10*3/uL (ref 0.0–0.1)
EOS PCT: 3.3 % (ref 0.0–5.0)
Eosinophils Absolute: 0.2 10*3/uL (ref 0.0–0.7)
HCT: 46.8 % (ref 39.0–52.0)
HEMOGLOBIN: 15.8 g/dL (ref 13.0–17.0)
Lymphocytes Relative: 28 % (ref 12.0–46.0)
Lymphs Abs: 1.6 10*3/uL (ref 0.7–4.0)
MCHC: 33.7 g/dL (ref 30.0–36.0)
MCV: 89 fl (ref 78.0–100.0)
MONO ABS: 0.4 10*3/uL (ref 0.1–1.0)
MONOS PCT: 6.9 % (ref 3.0–12.0)
Neutro Abs: 3.4 10*3/uL (ref 1.4–7.7)
Neutrophils Relative %: 61.1 % (ref 43.0–77.0)
Platelets: 197 10*3/uL (ref 150.0–400.0)
RBC: 5.26 Mil/uL (ref 4.22–5.81)
RDW: 13.7 % (ref 11.5–15.5)
WBC: 5.5 10*3/uL (ref 4.0–10.5)

## 2015-05-05 LAB — BASIC METABOLIC PANEL
BUN: 18 mg/dL (ref 6–23)
CALCIUM: 9.7 mg/dL (ref 8.4–10.5)
CO2: 34 mEq/L — ABNORMAL HIGH (ref 19–32)
CREATININE: 1.07 mg/dL (ref 0.40–1.50)
Chloride: 100 mEq/L (ref 96–112)
GFR: 72.94 mL/min (ref >60.00)
GLUCOSE: 111 mg/dL — AB (ref 70–99)
Potassium: 4.1 mEq/L (ref 3.5–5.1)
SODIUM: 143 meq/L (ref 135–145)

## 2015-05-05 LAB — TSH: TSH: 2.38 u[IU]/mL (ref 0.35–4.50)

## 2015-05-05 LAB — HEPATIC FUNCTION PANEL
ALT: 60 U/L — AB (ref 0–53)
AST: 38 U/L — AB (ref 0–37)
Albumin: 4.4 g/dL (ref 3.5–5.2)
Alkaline Phosphatase: 95 U/L (ref 39–117)
BILIRUBIN TOTAL: 0.7 mg/dL (ref 0.2–1.2)
Bilirubin, Direct: 0.1 mg/dL (ref 0.0–0.3)
Total Protein: 7 g/dL (ref 6.0–8.3)

## 2015-05-05 LAB — LIPID PANEL
CHOL/HDL RATIO: 4
Cholesterol: 204 mg/dL — ABNORMAL HIGH (ref 0–200)
HDL: 55.1 mg/dL (ref >39.00)
LDL CALC: 117 mg/dL — AB (ref 0–99)
NonHDL: 148.9
TRIGLYCERIDES: 159 mg/dL — AB (ref 0.0–149.0)
VLDL: 31.8 mg/dL (ref 0.0–40.0)

## 2015-05-05 LAB — MAGNESIUM: MAGNESIUM: 2.3 mg/dL (ref 1.5–2.5)

## 2015-05-05 LAB — PSA: PSA: 2.24 ng/mL (ref 0.10–4.00)

## 2015-05-05 NOTE — Progress Notes (Signed)
Pre visit review using our clinic review tool, if applicable. No additional management support is needed unless otherwise documented below in the visit note. 

## 2015-05-05 NOTE — Assessment & Plan Note (Signed)
PSA

## 2015-05-05 NOTE — Progress Notes (Signed)
Subjective:  Patient ID: William [name], male    DOB: [birth year]  Age: 69 y.o. MRN: [id number]  CC: No chief complaint on file.   HPI Letcher W Dugal presents for A fib, anticoagulation, dyslipidemia, CAD  Outpatient Prescriptions Prior to Visit  Medication Sig Dispense Refill  . apixaban (ELIQUIS) 5 MG TABS tablet Take 1 tablet (5 mg total) by mouth 2 (two) times daily. 180 tablet 2  . atorvastatin (LIPITOR) 40 MG tablet Take 1 tablet by mouth  daily 90 tablet 3  . Cholecalciferol 1000 UNITS tablet Take 1,000 Units by mouth daily.      Marland Kitchen losartan-hydrochlorothiazide (HYZAAR) 100-25 MG tablet Take 1 tablet by mouth  daily 90 tablet 3  . potassium chloride SA (K-DUR,KLOR-CON) 20 MEQ tablet Take 1 tablet by mouth  daily 90 tablet 3  . vardenafil (LEVITRA) 20 MG tablet Take 0.5-1 tablets (10-20 mg total) by mouth daily as needed. 21 tablet 3   No facility-administered medications prior to visit.    ROS Review of Systems  Constitutional: Negative for appetite change, fatigue and unexpected weight change.  HENT: Negative for congestion, nosebleeds, sneezing, sore throat and trouble swallowing.   Eyes: Negative for itching and visual disturbance.  Respiratory: Negative for cough.   Cardiovascular: Negative for chest pain, palpitations and leg swelling.  Gastrointestinal: Negative for nausea, diarrhea, blood in stool and abdominal distention.  Genitourinary: Negative for frequency and hematuria.  Musculoskeletal: Negative for back pain, joint swelling, gait problem and neck pain.  Skin: Negative for rash.  Neurological: Negative for dizziness, tremors, speech difficulty and weakness.  Psychiatric/Behavioral: Negative for sleep disturbance, dysphoric mood and agitation. The patient is not nervous/anxious.     Objective:  BP 136/80 mmHg  Pulse 84  Wt 199 lb (90.266 kg)  SpO2 93%  BP Readings from Last 3 Encounters:  05/05/15 136/80  12/09/14 142/84  10/14/14 110/80    Wt  Readings from Last 3 Encounters:  05/05/15 199 lb (90.266 kg)  12/09/14 196 lb 3.2 oz (88.996 kg)  10/14/14 197 lb (89.359 kg)    Physical Exam  Constitutional: He is oriented to person, place, and time. He appears well-developed. No distress.  NAD  HENT:  Mouth/Throat: Oropharynx is clear and moist.  Eyes: Conjunctivae are normal. Pupils are equal, round, and reactive to light.  Neck: Normal range of motion. No JVD present. No thyromegaly present.  Cardiovascular: Normal rate, regular rhythm, normal heart sounds and intact distal pulses.  Exam reveals no gallop and no friction rub.   No murmur heard. Pulmonary/Chest: Effort normal and breath sounds normal. No respiratory distress. He has no wheezes. He has no rales. He exhibits no tenderness.  Abdominal: Soft. Bowel sounds are normal. He exhibits no distension and no mass. There is no tenderness. There is no rebound and no guarding.  Musculoskeletal: Normal range of motion. He exhibits no edema or tenderness.  Lymphadenopathy:    He has no cervical adenopathy.  Neurological: He is alert and oriented to person, place, and time. He has normal reflexes. No cranial nerve deficit. He exhibits normal muscle tone. He displays a negative Romberg sign. Coordination and gait normal.  Skin: Skin is warm and dry. No rash noted.  Psychiatric: He has a normal mood and affect. His behavior is normal. Judgment and thought content normal.  AKs  Lab Results  Component Value Date   WBC 7.6 07/08/2014   HGB 15.7 07/08/2014   HCT 46.3 07/08/2014   PLT 189.0 07/08/2014  GLUCOSE 111* 07/08/2014   CHOL 201* 07/08/2014   TRIG 178.0* 07/08/2014   HDL 53.40 07/08/2014   LDLCALC 112* 07/08/2014   ALT 48 07/08/2014   AST 39* 07/08/2014   NA 142 07/08/2014   K 4.1 07/08/2014   CL 102 07/08/2014   CREATININE 1.1 07/08/2014   BUN 19 07/08/2014   CO2 30 07/08/2014   TSH 2.49 07/08/2014   PSA 2.04 07/08/2014   HGBA1C 6.1 07/06/2012    Procedure Note  :     Procedure : Cryosurgery   Indication:   Actinic keratosis(es)   Risks including unsuccessful procedure , bleeding, infection, bruising, scar, a need for a repeat  procedure and others were explained to the patient in detail as well as the benefits. Informed consent was obtained verbally.    12 lesion(s)  on scalp and face   was/were treated with liquid nitrogen on a Q-tip in a usual fasion . Band-Aid was applied and antibiotic ointment was given for a later use.   Tolerated well. Complications none.    No results found.  Assessment & Plan:   Diagnoses and all orders for this visit:  Paroxysmal atrial fibrillation ([place]) -     Hepatic function panel; Future -     Basic metabolic panel; Future -     CBC with Differential/Platelet; Future -     Magnesium; Future -     TSH; Future -     Lipid panel; Future -     PSA; Future  Essential hypertension -     Hepatic function panel; Future -     Basic metabolic panel; Future -     CBC with Differential/Platelet; Future -     Magnesium; Future -     TSH; Future -     Lipid panel; Future -     PSA; Future  PSA, INCREASED -     Hepatic function panel; Future -     Basic metabolic panel; Future -     CBC with Differential/Platelet; Future -     Magnesium; Future -     TSH; Future -     Lipid panel; Future -     PSA; Future  I am having Mr. Lipke maintain his Cholecalciferol, vardenafil, atorvastatin, apixaban, potassium chloride SA, and losartan-hydrochlorothiazide.  No orders of the defined types were placed in this encounter.     Follow-up: Return in about 6 months (around 11/02/2015) for Wellness Exam.  Walker Kehr, MD

## 2015-05-05 NOTE — Patient Instructions (Signed)
   Postprocedure instructions :     Keep the wounds clean. You can wash them with liquid soap and water. Pat dry with gauze or a Kleenex tissue  Before applying antibiotic ointment and a Band-Aid.   You need to report immediately  if  any signs of infection develop.    

## 2015-05-05 NOTE — Assessment & Plan Note (Signed)
Losartan HCT 

## 2015-05-05 NOTE — Assessment & Plan Note (Addendum)
No relapse in 6 mo Labs On Eliquis

## 2015-05-05 NOTE — Assessment & Plan Note (Signed)
See Procedure 

## 2015-05-07 ENCOUNTER — Other Ambulatory Visit: Payer: Self-pay | Admitting: Internal Medicine

## 2015-05-07 DIAGNOSIS — R7989 Other specified abnormal findings of blood chemistry: Secondary | ICD-10-CM

## 2015-05-07 DIAGNOSIS — R945 Abnormal results of liver function studies: Principal | ICD-10-CM

## 2015-05-24 ENCOUNTER — Other Ambulatory Visit: Payer: Self-pay | Admitting: Cardiovascular Disease

## 2015-07-17 ENCOUNTER — Encounter: Payer: Self-pay | Admitting: Cardiovascular Disease

## 2015-07-17 ENCOUNTER — Ambulatory Visit (INDEPENDENT_AMBULATORY_CARE_PROVIDER_SITE_OTHER): Payer: Medicare Other | Admitting: Cardiovascular Disease

## 2015-07-17 VITALS — BP 138/90 | HR 87 | Ht 66.0 in | Wt 201.0 lb

## 2015-07-17 DIAGNOSIS — I480 Paroxysmal atrial fibrillation: Secondary | ICD-10-CM | POA: Diagnosis not present

## 2015-07-17 DIAGNOSIS — G4733 Obstructive sleep apnea (adult) (pediatric): Secondary | ICD-10-CM | POA: Diagnosis not present

## 2015-07-17 DIAGNOSIS — E785 Hyperlipidemia, unspecified: Secondary | ICD-10-CM

## 2015-07-17 DIAGNOSIS — I10 Essential (primary) hypertension: Secondary | ICD-10-CM | POA: Diagnosis not present

## 2015-07-17 NOTE — Progress Notes (Signed)
Patient ID: William [name], male   DOB: 04-06-1946, 70 y.o.   MRN: [id number]     Cardiology Office Note    Date:  07/17/2015   ID:  Cruze, Amaya 1946-05-16, MRN [id number]  PCP:  Walker Kehr, MD  Cardiologist:   Sanda Klein, MD   Chief Complaint  Patient presents with  . 6 MONTHS    NO COMPLAINTS.    History of Present Illness:  William [name] is a 70 y.o. male with previous episodes of paroxysmal atrial fibrillation who returns for routine follow-up. Since his last appointment he does not think he has had any sustained arrhythmia. He has not had any focal neurological events to suggest embolic stroke or TIA. He has not had any serious bleeding problems. He had one episode of hematospermia, none since. Treatment with beta blockers in the past led to a symptomatic junctional escape rhythm and he is currently not on any rate control medications. His blood pressure is well controlled and he takes a statin for hyperlipidemia.    Past Medical History  Diagnosis Date  . Colon polyps     Dr Sharlett Iles  . Hyperlipidemia   . HTN (hypertension)   . GERD (gastroesophageal reflux disease)   . OSA on CPAP   . Prostatitis     Past Surgical History  Procedure Laterality Date  . Rotator cuff repair  2008    left; Dr Para March  . Appendectomy  2012    09/2010 in [place]    Outpatient Prescriptions Prior to Visit  Medication Sig Dispense Refill  . atorvastatin (LIPITOR) 40 MG tablet Take 1 tablet by mouth  daily 90 tablet 3  . Cholecalciferol 1000 UNITS tablet Take 1,000 Units by mouth daily.      Marland Kitchen ELIQUIS 5 MG TABS tablet Take 1 tablet by mouth two  times daily 180 tablet 0  . losartan-hydrochlorothiazide (HYZAAR) 100-25 MG tablet Take 1 tablet by mouth  daily 90 tablet 3  . potassium chloride SA (K-DUR,KLOR-CON) 20 MEQ tablet Take 1 tablet by mouth  daily 90 tablet 3  . vardenafil (LEVITRA) 20 MG tablet Take 0.5-1 tablets (10-20 mg total) by mouth daily as needed. 21 tablet 3    No facility-administered medications prior to visit.     Allergies:   Benazepril hcl   Social History   Social History  . Marital Status: Married    Spouse Name: N/A  . Number of Children: 3  . Years of Education: N/A   Occupational History  . Accountant    Social History Main Topics  . Smoking status: Never Smoker   . Smokeless tobacco: Never Used  . Alcohol Use: No  . Drug Use: No  . Sexual Activity: Yes   Other Topics Concern  . None   Social History Narrative   Regular Exercise - Yes, gym              Family History:  The patient's family history includes Asthma in his son; Coronary artery disease in his mother; Heart disease (age of onset: 19) in his father and mother; Hypertension in his other; Stroke in his sister. There is no history of Colon cancer.   ROS:   Please see the history of present illness.    ROS All other systems reviewed and are negative.   PHYSICAL EXAM:   VS:  BP 138/90 mmHg  Pulse 87  Ht 5\' 6"  (1.676 m)  Wt 201 lb (91.173 kg)  BMI 32.46 kg/m2  GEN: Well nourished, well developed, in no acute distress HEENT: normal Neck: no JVD, carotid bruits, or masses Cardiac: RRR; no murmurs, rubs, or gallops,no edema  Respiratory:  clear to auscultation bilaterally, normal work of breathing GI: soft, nontender, nondistended, + BS MS: no deformity or atrophy Skin: warm and dry, no rash Neuro:  Alert and Oriented x 3, Strength and sensation are intact Psych: euthymic mood, full affect  Wt Readings from Last 3 Encounters:  07/17/15 201 lb (91.173 kg)  05/05/15 199 lb (90.266 kg)  12/09/14 196 lb 3.2 oz (88.996 kg)      Studies/Labs Reviewed:   EKG:  EKG is ordered today.  The ekg ordered today demonstrates NSR, nonspecific ST segment changes (old), QTC 442 ms.  Recent Labs: 05/05/2015: ALT 60*; BUN 18; Creatinine, Ser 1.07; Hemoglobin 15.8; Magnesium 2.3; Platelets 197.0; Potassium 4.1; Sodium 143; TSH 2.38   Lipid Panel     Component Value Date/Time   CHOL 204* 05/05/2015 0830   TRIG 159.0* 05/05/2015 0830   TRIG 67 05/03/2006 0753   HDL 55.10 05/05/2015 0830   CHOLHDL 4 05/05/2015 0830   CHOLHDL 3.3 CALC 05/03/2006 0753   VLDL 31.8 05/05/2015 0830   LDLCALC 117* 05/05/2015 0830     ASSESSMENT:    1. Paroxysmal atrial fibrillation (HCC)   2. Obstructive sleep apnea   3. Dyslipidemia   4. Essential hypertension      PLAN:  In order of problems listed above:  1. PAF: No recent symptomatic events; tolerating anticoagulation well, one episode of hematospermia likely not clinically significant. Asked to discuss with his urologist if it recurs. He is no longer bradycardic, but will avoid rate control medications due to the previous events when he took a beta blocker. In the absence of symptoms, aggressive antiarrhythmic therapy, either pharmacologically or with ablation, is not indicated.  2. OSA: reports compliance with CPAP  3. HLP: no evidence of coronary disease or other structural cardiac abnormalities by previous workup; target LDL less than 100. Now on atorvastatin 40 mg daily. Weight loss and regular exercise recommended. 4. HTN: Blood pressure with fair control. He had a very difficult at work today and believes that is the cause for his borderline elevated blood pressure and uncharacteristically fast heartbeat.    Medication Adjustments/Labs and Tests Ordered: Current medicines are reviewed at length with the patient today.  Concerns regarding medicines are outlined above.  Medication changes, Labs and Tests ordered today are listed in the Patient Instructions below. Patient Instructions  Dr Sallyanne Kuster has made no changes today in your current medications or treatment plan.  Your physician recommends that you schedule a follow-up appointment in 1 year. You will receive a reminder letter in the mail two months in advance. If you don't receive a letter, please call our office to schedule the  follow-up appointment.  If you need a refill on your cardiac medications before your next appointment, please call your pharmacy.      Mikael Spray, MD  07/17/2015 5:58 PM    [address], [place], [place]  [address] Phone: [phone]; Fax: [phone]

## 2015-07-17 NOTE — Patient Instructions (Signed)
Dr Croitoru has made no changes today in your current medications or treatment plan.  Your physician recommends that you schedule a follow-up appointment in 1 year. You will receive a reminder letter in the mail two months in advance. If you don't receive a letter, please call our office to schedule the follow-up appointment.  If you need a refill on your cardiac medications before your next appointment, please call your pharmacy. 

## 2015-08-26 ENCOUNTER — Other Ambulatory Visit: Payer: Self-pay | Admitting: Cardiovascular Disease

## 2015-11-04 ENCOUNTER — Encounter: Payer: Self-pay | Admitting: Internal Medicine

## 2015-11-04 ENCOUNTER — Ambulatory Visit (INDEPENDENT_AMBULATORY_CARE_PROVIDER_SITE_OTHER): Payer: Medicare Other | Admitting: Internal Medicine

## 2015-11-04 ENCOUNTER — Other Ambulatory Visit (INDEPENDENT_AMBULATORY_CARE_PROVIDER_SITE_OTHER): Payer: Medicare Other

## 2015-11-04 VITALS — BP 120/74 | HR 99 | Ht 66.0 in | Wt 195.0 lb

## 2015-11-04 DIAGNOSIS — L570 Actinic keratosis: Secondary | ICD-10-CM | POA: Diagnosis not present

## 2015-11-04 DIAGNOSIS — R945 Abnormal results of liver function studies: Principal | ICD-10-CM

## 2015-11-04 DIAGNOSIS — R7989 Other specified abnormal findings of blood chemistry: Secondary | ICD-10-CM

## 2015-11-04 DIAGNOSIS — Z23 Encounter for immunization: Secondary | ICD-10-CM

## 2015-11-04 DIAGNOSIS — Z0000 Encounter for general adult medical examination without abnormal findings: Secondary | ICD-10-CM | POA: Diagnosis not present

## 2015-11-04 DIAGNOSIS — H6122 Impacted cerumen, left ear: Secondary | ICD-10-CM | POA: Diagnosis not present

## 2015-11-04 DIAGNOSIS — I10 Essential (primary) hypertension: Secondary | ICD-10-CM

## 2015-11-04 LAB — CBC WITH DIFFERENTIAL/PLATELET
BASOS PCT: 0.5 % (ref 0.0–3.0)
Basophils Absolute: 0 10*3/uL (ref 0.0–0.1)
EOS ABS: 0.1 10*3/uL (ref 0.0–0.7)
Eosinophils Relative: 2.1 % (ref 0.0–5.0)
HCT: 45.9 % (ref 39.0–52.0)
HEMOGLOBIN: 15.8 g/dL (ref 13.0–17.0)
LYMPHS ABS: 1.5 10*3/uL (ref 0.7–4.0)
Lymphocytes Relative: 25.7 % (ref 12.0–46.0)
MCHC: 34.3 g/dL (ref 30.0–36.0)
MCV: 87.9 fl (ref 78.0–100.0)
MONO ABS: 0.4 10*3/uL (ref 0.1–1.0)
Monocytes Relative: 6.9 % (ref 3.0–12.0)
NEUTROS PCT: 64.8 % (ref 43.0–77.0)
Neutro Abs: 3.7 10*3/uL (ref 1.4–7.7)
Platelets: 239 10*3/uL (ref 150.0–400.0)
RBC: 5.22 Mil/uL (ref 4.22–5.81)
RDW: 13.6 % (ref 11.5–15.5)
WBC: 5.7 10*3/uL (ref 4.0–10.5)

## 2015-11-04 LAB — PSA: PSA: 3.67 ng/mL (ref 0.10–4.00)

## 2015-11-04 LAB — LIPID PANEL
Cholesterol: 198 mg/dL (ref 0–200)
HDL: 55.7 mg/dL (ref >39.00)
LDL Cholesterol: 108 mg/dL — ABNORMAL HIGH (ref 0–99)
NONHDL: 142.3
TRIGLYCERIDES: 172 mg/dL — AB (ref 0.0–149.0)
Total CHOL/HDL Ratio: 4
VLDL: 34.4 mg/dL (ref 0.0–40.0)

## 2015-11-04 LAB — BASIC METABOLIC PANEL
BUN: 19 mg/dL (ref 6–23)
CALCIUM: 10 mg/dL (ref 8.4–10.5)
CO2: 34 mEq/L — ABNORMAL HIGH (ref 19–32)
Chloride: 99 mEq/L (ref 96–112)
Creatinine, Ser: 1.21 mg/dL (ref 0.40–1.50)
GFR: 63.2 mL/min (ref >60.00)
GLUCOSE: 115 mg/dL — AB (ref 70–99)
POTASSIUM: 4 meq/L (ref 3.5–5.1)
SODIUM: 143 meq/L (ref 135–145)

## 2015-11-04 LAB — URINALYSIS
BILIRUBIN URINE: NEGATIVE
HGB URINE DIPSTICK: NEGATIVE
KETONES UR: NEGATIVE
LEUKOCYTES UA: NEGATIVE
NITRITE: NEGATIVE
Specific Gravity, Urine: 1.02 (ref 1.000–1.030)
Urine Glucose: NEGATIVE
Urobilinogen, UA: 0.2 (ref 0.0–1.0)
pH: 6.5 (ref 5.0–8.0)

## 2015-11-04 LAB — HEPATIC FUNCTION PANEL
ALBUMIN: 4.8 g/dL (ref 3.5–5.2)
ALT: 64 U/L — ABNORMAL HIGH (ref 0–53)
AST: 50 U/L — ABNORMAL HIGH (ref 0–37)
Alkaline Phosphatase: 119 U/L — ABNORMAL HIGH (ref 39–117)
Bilirubin, Direct: 0.2 mg/dL (ref 0.0–0.3)
TOTAL PROTEIN: 7.6 g/dL (ref 6.0–8.3)
Total Bilirubin: 0.9 mg/dL (ref 0.2–1.2)

## 2015-11-04 LAB — TSH: TSH: 3.05 u[IU]/mL (ref 0.35–4.50)

## 2015-11-04 MED ORDER — POTASSIUM CHLORIDE CRYS ER 20 MEQ PO TBCR: EXTENDED_RELEASE_TABLET | ORAL | Status: DC

## 2015-11-04 MED ORDER — ATORVASTATIN CALCIUM 40 MG PO TABS: 40.0000 mg | ORAL_TABLET | Freq: Every day | ORAL | Status: DC

## 2015-11-04 MED ORDER — VARDENAFIL HCL 20 MG PO TABS: 10.0000 mg | ORAL_TABLET | Freq: Every day | ORAL | Status: DC | PRN

## 2015-11-04 MED ORDER — LOSARTAN POTASSIUM-HCTZ 100-25 MG PO TABS: 1.0000 | ORAL_TABLET | Freq: Every day | ORAL | Status: DC

## 2015-11-04 NOTE — Progress Notes (Signed)
Pre visit review using our clinic review tool, if applicable. No additional management support is needed unless otherwise documented below in the visit note. 

## 2015-11-04 NOTE — Assessment & Plan Note (Signed)
See procedure 

## 2015-11-04 NOTE — Assessment & Plan Note (Signed)
See Cryo 

## 2015-11-04 NOTE — Progress Notes (Signed)
Subjective:  Patient ID: William [name], male    DOB: 07-17-1945  Age: 70 y.o. MRN: [id number]  CC: No chief complaint on file.   HPI William [name] presents for a well exam  Outpatient Prescriptions Prior to Visit  Medication Sig Dispense Refill  . Cholecalciferol 1000 UNITS tablet Take 1,000 Units by mouth daily.      Marland Kitchen ELIQUIS 5 MG TABS tablet Take 1 tablet by mouth two  times daily 180 tablet 3  . atorvastatin (LIPITOR) 40 MG tablet Take 1 tablet by mouth  daily 90 tablet 3  . losartan-hydrochlorothiazide (HYZAAR) 100-25 MG tablet Take 1 tablet by mouth  daily 90 tablet 3  . potassium chloride SA (K-DUR,KLOR-CON) 20 MEQ tablet Take 1 tablet by mouth  daily 90 tablet 3  . vardenafil (LEVITRA) 20 MG tablet Take 0.5-1 tablets (10-20 mg total) by mouth daily as needed. 21 tablet 3   No facility-administered medications prior to visit.    ROS Review of Systems  Constitutional: Negative for appetite change, fatigue and unexpected weight change.  HENT: Negative for congestion, nosebleeds, sneezing, sore throat and trouble swallowing.   Eyes: Negative for itching and visual disturbance.  Respiratory: Negative for cough.   Cardiovascular: Positive for palpitations. Negative for chest pain and leg swelling.  Gastrointestinal: Negative for nausea, diarrhea, blood in stool and abdominal distention.  Genitourinary: Negative for frequency and hematuria.  Musculoskeletal: Negative for back pain, joint swelling, gait problem and neck pain.  Skin: Negative for rash.  Neurological: Negative for dizziness, tremors, speech difficulty and weakness.  Psychiatric/Behavioral: Negative for suicidal ideas, sleep disturbance, dysphoric mood and agitation. The patient is not nervous/anxious.     Objective:  BP 120/74 mmHg  Pulse 99  Ht 5\' 6"  (1.676 m)  Wt 195 lb (88.451 kg)  BMI 31.49 kg/m2  SpO2 94%  BP Readings from Last 3 Encounters:  11/04/15 120/74  07/17/15 138/90  05/05/15 136/80     Wt Readings from Last 3 Encounters:  11/04/15 195 lb (88.451 kg)  07/17/15 201 lb (91.173 kg)  05/05/15 199 lb (90.266 kg)    Physical Exam  Constitutional: He is oriented to person, place, and time. He appears well-developed and well-nourished. No distress.  HENT:  Head: Normocephalic and atraumatic.  Right Ear: External ear normal.  Left Ear: External ear normal.  Nose: Nose normal.  Mouth/Throat: Oropharynx is clear and moist. No oropharyngeal exudate.  Eyes: Conjunctivae and EOM are normal. Pupils are equal, round, and reactive to light. Right eye exhibits no discharge. Left eye exhibits no discharge. No scleral icterus.  Neck: Normal range of motion. Neck supple. No JVD present. No tracheal deviation present. No thyromegaly present.  Cardiovascular: Normal rate, regular rhythm, normal heart sounds and intact distal pulses.  Exam reveals no gallop and no friction rub.   No murmur heard. Pulmonary/Chest: Effort normal and breath sounds normal. No stridor. No respiratory distress. He has no wheezes. He has no rales. He exhibits no tenderness.  Abdominal: Soft. Bowel sounds are normal. He exhibits no distension and no mass. There is no tenderness. There is no rebound and no guarding.  Genitourinary: Rectum normal, prostate normal and penis normal. Guaiac negative stool. No penile tenderness.  Musculoskeletal: Normal range of motion. He exhibits no edema or tenderness.  Lymphadenopathy:    He has no cervical adenopathy.  Neurological: He is alert and oriented to person, place, and time. He has normal reflexes. No cranial nerve deficit. He exhibits normal muscle  tone. Coordination normal.  Skin: Skin is warm and dry. No rash noted. He is not diaphoretic. No erythema. No pallor.  Psychiatric: He has a normal mood and affect. His behavior is normal. Judgment and thought content normal.  AK Wax L ear  Lab Results  Component Value Date   WBC 5.7 11/04/2015   HGB 15.8 11/04/2015    HCT 45.9 11/04/2015   PLT 239.0 11/04/2015   GLUCOSE 115* 11/04/2015   CHOL 198 11/04/2015   TRIG 172.0* 11/04/2015   HDL 55.70 11/04/2015   LDLCALC 108* 11/04/2015   ALT 64* 11/04/2015   AST 50* 11/04/2015   NA 143 11/04/2015   K 4.0 11/04/2015   CL 99 11/04/2015   CREATININE 1.21 11/04/2015   BUN 19 11/04/2015   CO2 34* 11/04/2015   TSH 3.05 11/04/2015   PSA 3.67 11/04/2015   HGBA1C 6.1 07/06/2012    Procedure Note :     Procedure : Cryosurgery   Indication: Actinic keratosis(es)   Risks including unsuccessful procedure , bleeding, infection, bruising, scar, a need for a repeat  procedure and others were explained to the patient in detail as well as the benefits. Informed consent was obtained verbally.    3 lesion(s)  on face   was/were treated with liquid nitrogen on a Q-tip in a usual fasion . Band-Aid was applied and antibiotic ointment was given for a later use.   Tolerated well. Complications none.   Postprocedure instructions :     Keep the wounds clean. You can wash them with liquid soap and water. Pat dry with gauze or a Kleenex tissue  Before applying antibiotic ointment and a Band-Aid.   You need to report immediately  if  any signs of infection develop.     Procedure Note :     Procedure :  Ear irrigation   Indication:  Cerumen impaction L   Risks, including pain, dizziness, eardrum perforation, bleeding, infection and others as well as benefits were explained to the patient in detail. Verbal consent was obtained and the patient agreed to proceed.    We used "The Elephant Ear Irrigation Device" filled with lukewarm water for irrigation. A large amount wax was recovered. Procedure has also required manual wax removal with an ear loop.   Tolerated well. Complications: None.   Postprocedure instructions :  Call if problems.    Assessment & Plan:   Diagnoses and all orders for this visit:  Well adult exam -     Urinalysis; Future -     TSH;  Future -     PSA; Future -     CBC with Differential/Platelet; Future -     Basic metabolic panel; Future -     Hepatitis C antibody; Future -     Lipid panel; Future -     Hepatic function panel; Future  Essential hypertension  Actinic keratoses  Cerumen impaction, left  Need for prophylactic vaccination against Streptococcus pneumoniae (pneumococcus) -     Pneumococcal polysaccharide vaccine 23-valent greater than or equal to 2yo subcutaneous/IM  Other orders -     atorvastatin (LIPITOR) 40 MG tablet; Take 1 tablet (40 mg total) by mouth daily. -     losartan-hydrochlorothiazide (HYZAAR) 100-25 MG tablet; Take 1 tablet by mouth daily. -     potassium chloride SA (K-DUR,KLOR-CON) 20 MEQ tablet; Take 1 tablet by mouth  daily -     vardenafil (LEVITRA) 20 MG tablet; Take 0.5-1 tablets (10-20 mg total) by mouth  daily as needed.   I have changed Mr. Polan atorvastatin and losartan-hydrochlorothiazide. I am also having him maintain his Cholecalciferol, ELIQUIS, potassium chloride SA, and vardenafil.  Meds ordered this encounter  Medications  . atorvastatin (LIPITOR) 40 MG tablet    Sig: Take 1 tablet (40 mg total) by mouth daily.    Dispense:  90 tablet    Refill:  3  . losartan-hydrochlorothiazide (HYZAAR) 100-25 MG tablet    Sig: Take 1 tablet by mouth daily.    Dispense:  90 tablet    Refill:  3  . potassium chloride SA (K-DUR,KLOR-CON) 20 MEQ tablet    Sig: Take 1 tablet by mouth  daily    Dispense:  90 tablet    Refill:  3  . vardenafil (LEVITRA) 20 MG tablet    Sig: Take 0.5-1 tablets (10-20 mg total) by mouth daily as needed.    Dispense:  30 tablet    Refill:  3     Follow-up: Return in about 6 months (around 05/06/2016) for a follow-up visit.  Walker Kehr, MD

## 2015-11-04 NOTE — Assessment & Plan Note (Signed)
Chronic Losartan HCT

## 2015-11-04 NOTE — Assessment & Plan Note (Signed)

## 2015-11-04 NOTE — Patient Instructions (Signed)

## 2015-11-05 LAB — HEPATITIS C ANTIBODY: HCV AB: NEGATIVE

## 2015-11-11 DIAGNOSIS — R945 Abnormal results of liver function studies: Secondary | ICD-10-CM

## 2015-11-11 DIAGNOSIS — R7989 Other specified abnormal findings of blood chemistry: Secondary | ICD-10-CM | POA: Insufficient documentation

## 2015-11-11 NOTE — Assessment & Plan Note (Addendum)
5/17 recurrent elevated liver tests. Reduce Atorvastatin dose in half.   liver ultrasound in 2014 revealed fatty infiltration of the liver. Needs to loose 10-15 lbs

## 2015-11-19 ENCOUNTER — Encounter: Payer: Self-pay | Admitting: Internal Medicine

## 2015-11-20 ENCOUNTER — Other Ambulatory Visit: Payer: Self-pay | Admitting: Internal Medicine

## 2015-11-20 MED ORDER — ATORVASTATIN CALCIUM 20 MG PO TABS: 20.0000 mg | ORAL_TABLET | Freq: Every day | ORAL | Status: DC

## 2015-12-16 ENCOUNTER — Other Ambulatory Visit: Payer: Self-pay | Admitting: Internal Medicine

## 2016-01-30 ENCOUNTER — Other Ambulatory Visit: Payer: Self-pay | Admitting: Internal Medicine

## 2016-05-03 IMAGING — CR DG HIP COMPLETE 2+V*R*
3 series · 3 of 3 positions shown · non-contrast
Comparison: None

CLINICAL DATA: Chronic RIGHT hip pain, no known injury

EXAM:
RIGHT HIP - COMPLETE 2+ VIEW

[view not recorded (1 of 3)]
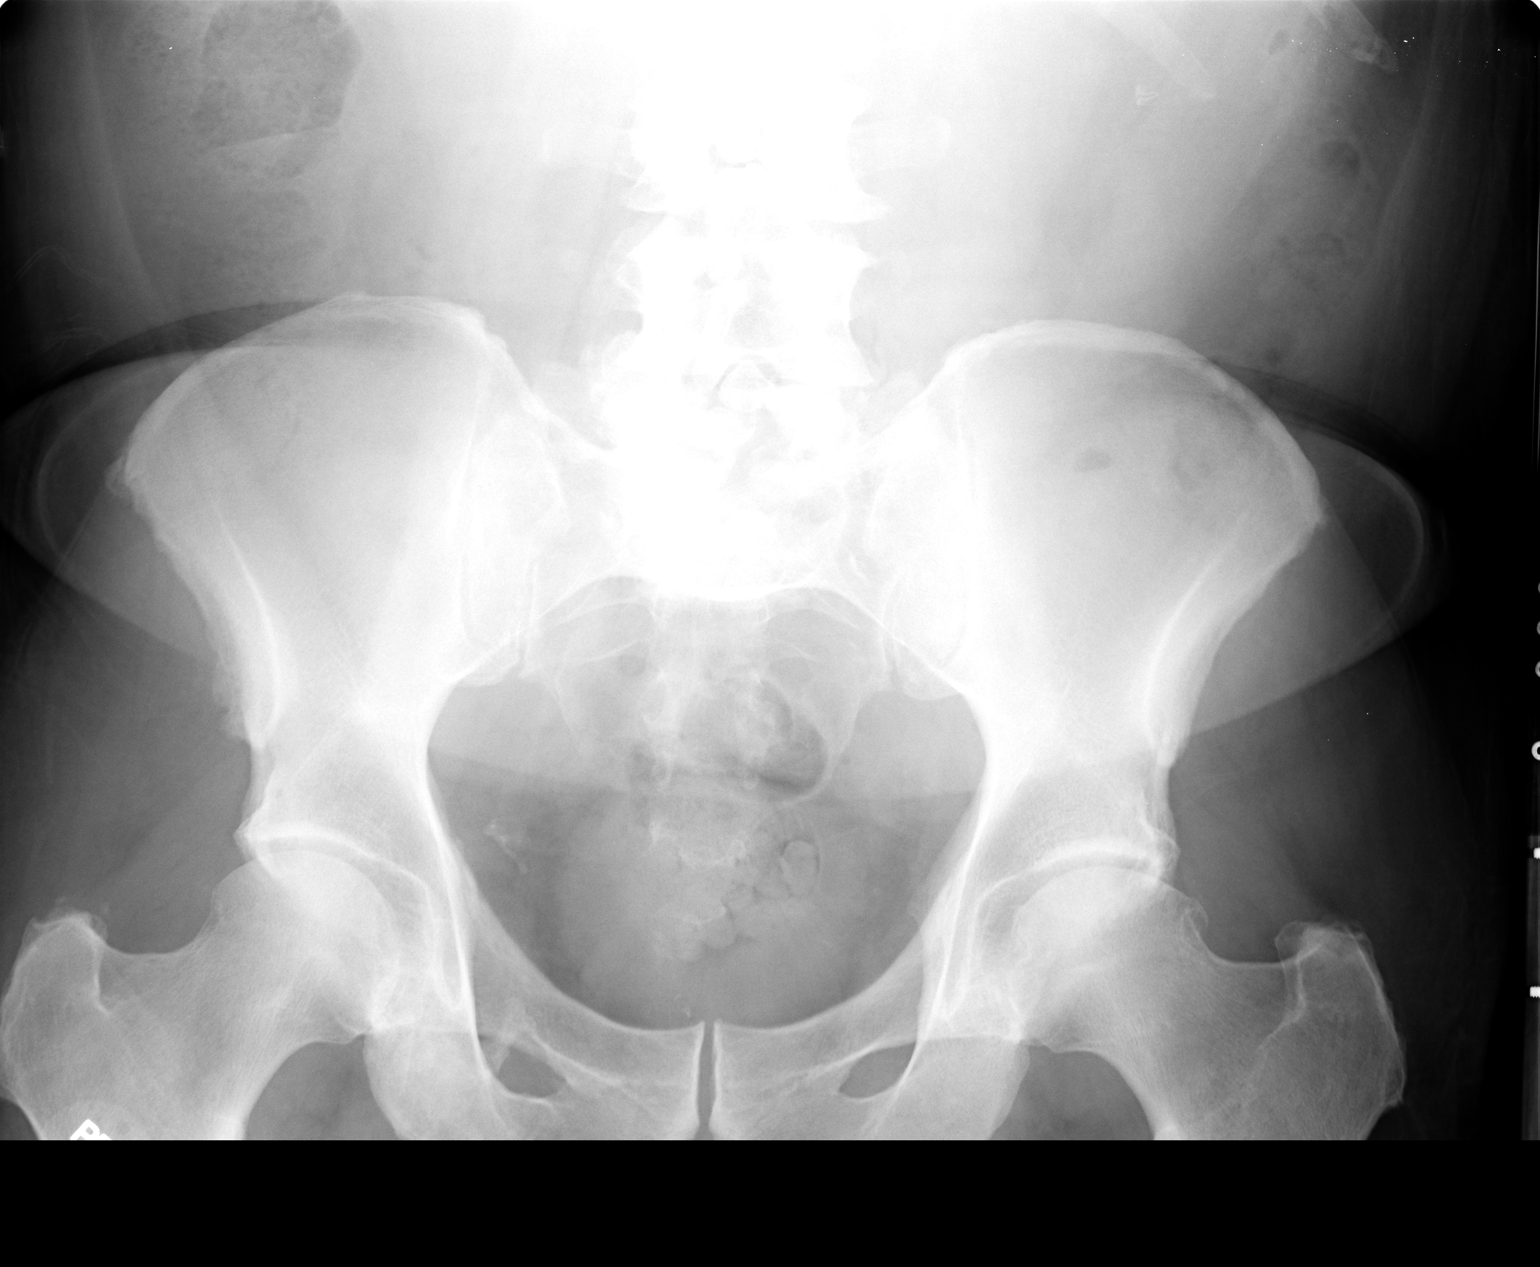

[view not recorded (2 of 3)]
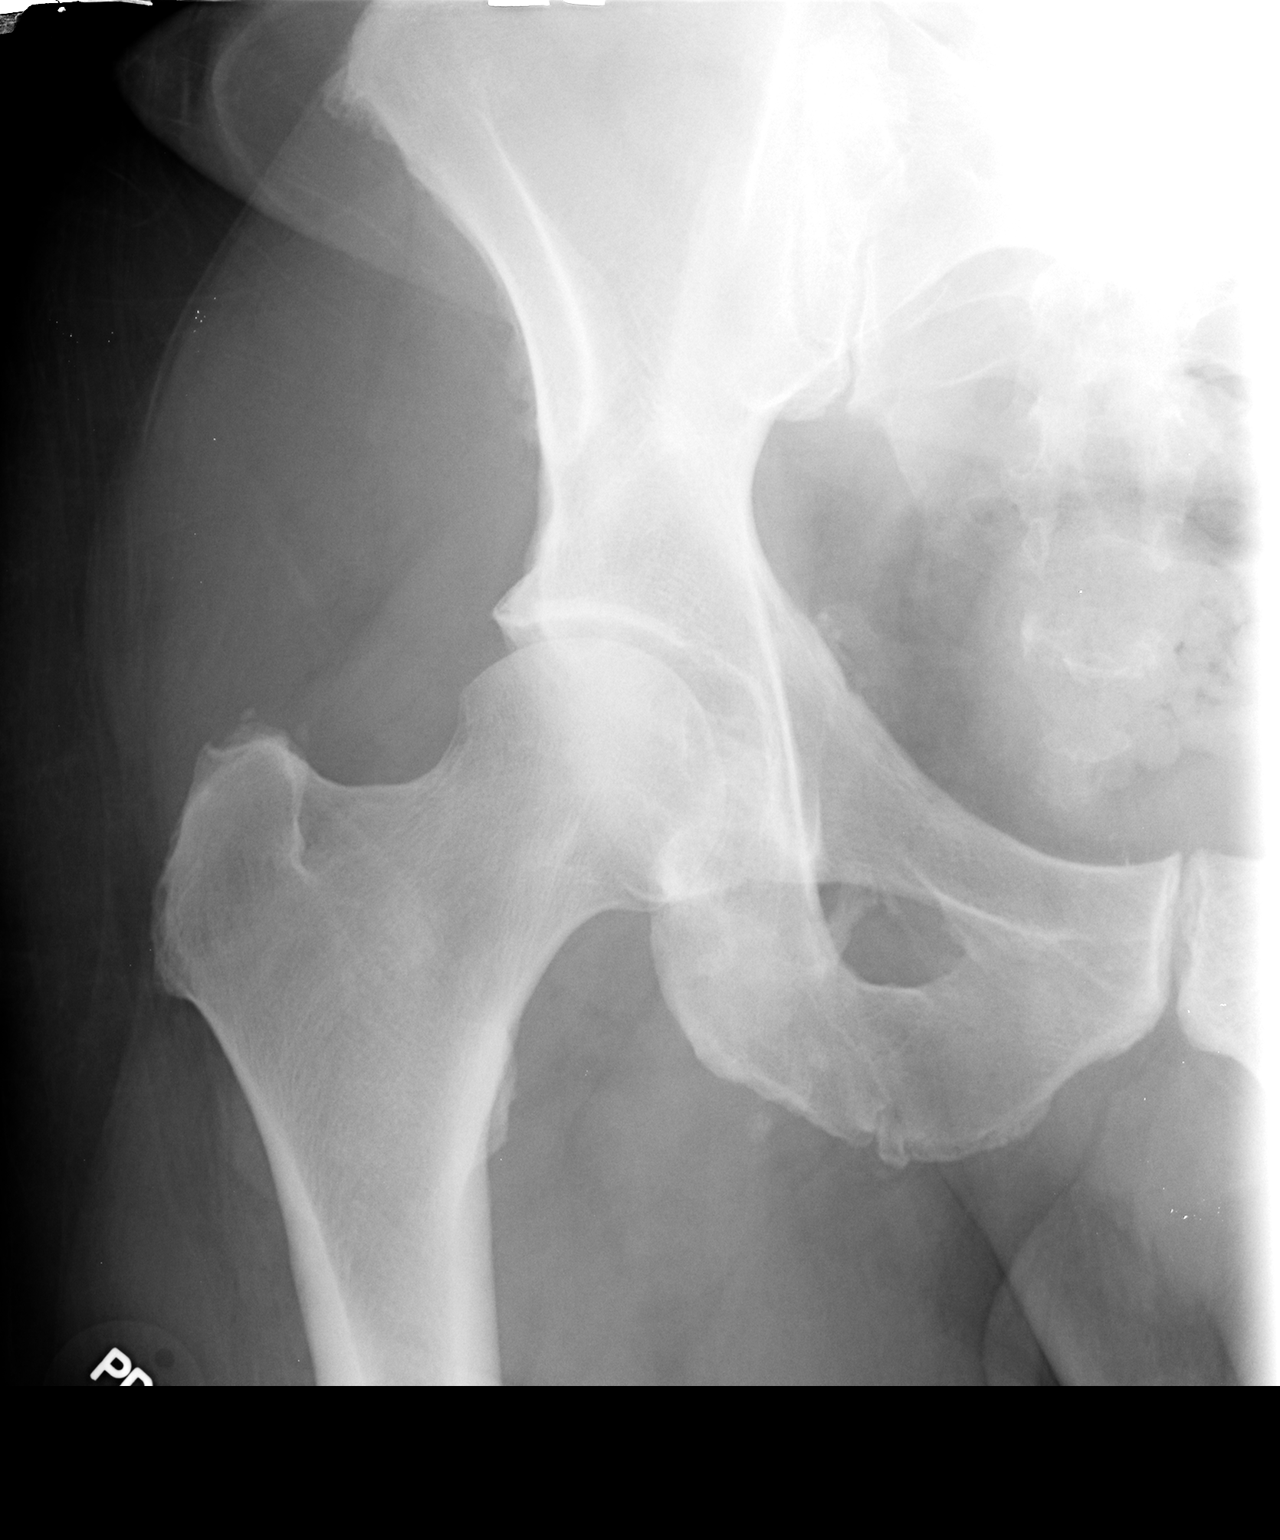

[view not recorded (3 of 3)]
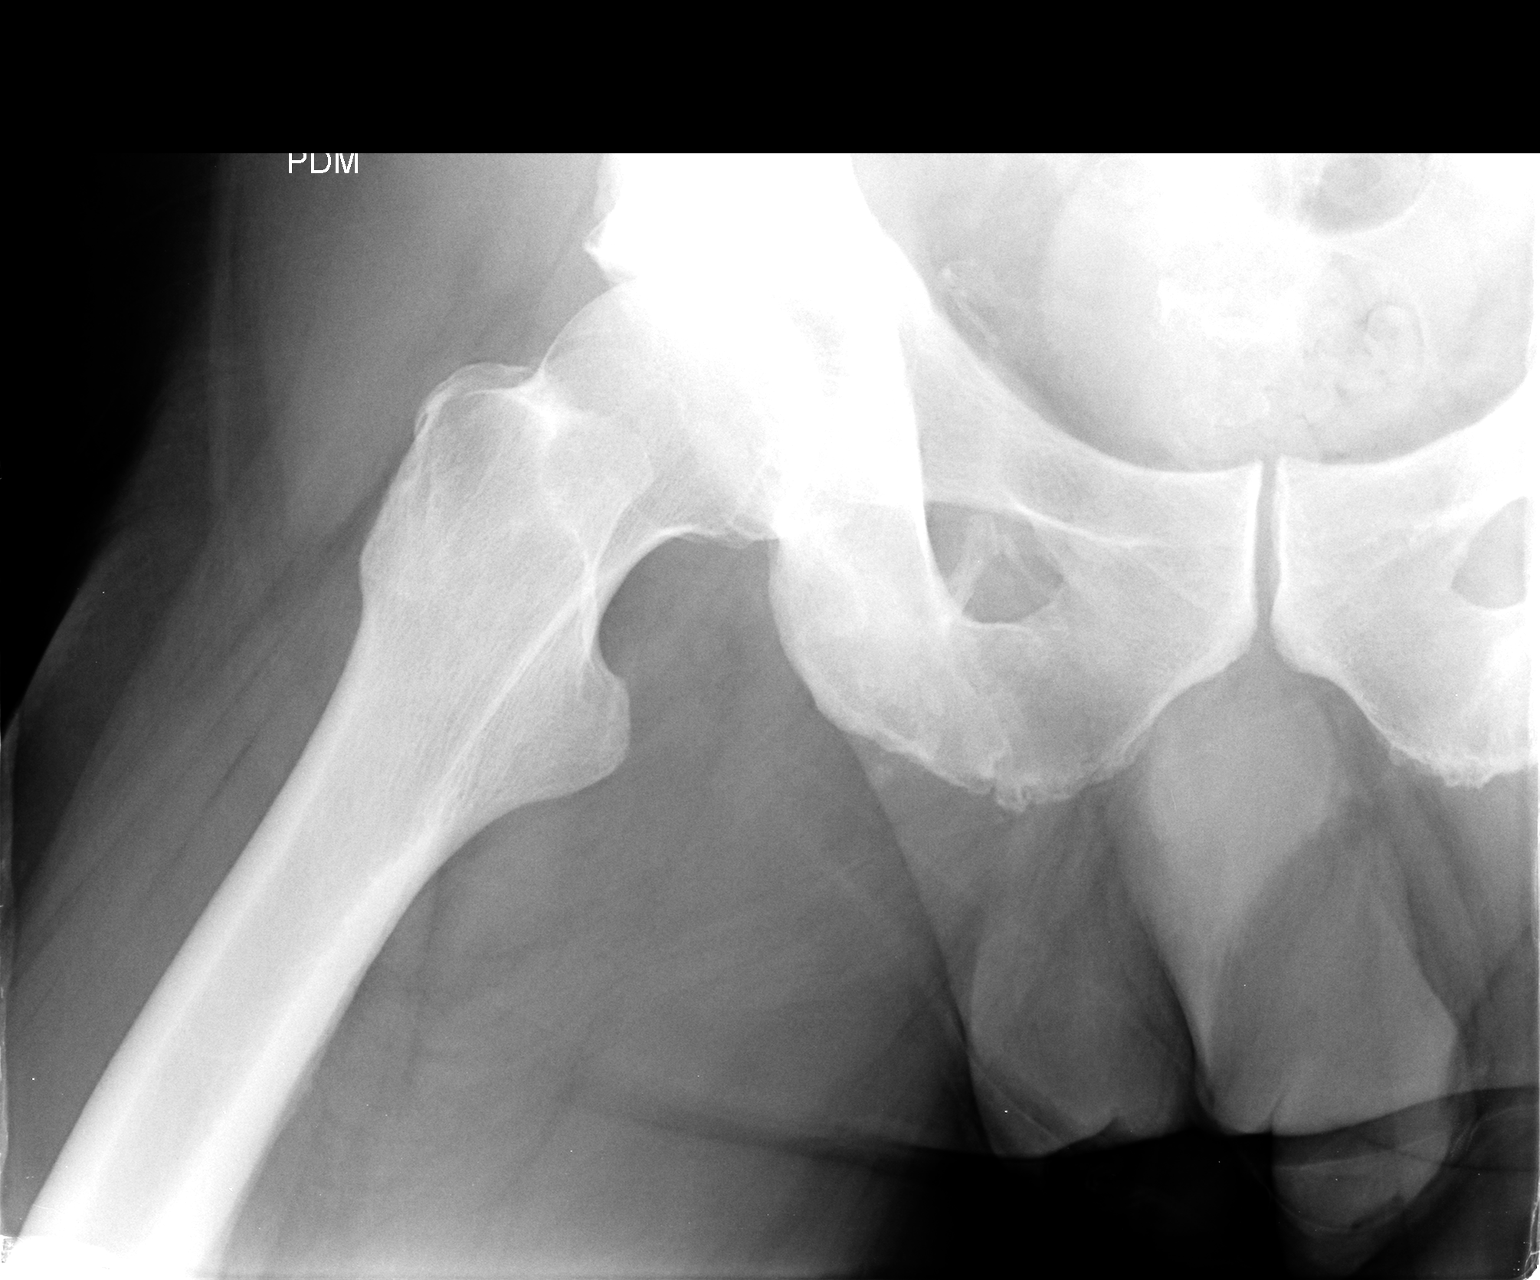

[3 of 3 positions shown; findings below may reference images not displayed]

FINDINGS: SI joints symmetric.

Hip joint spaces preserved.

Minimal spur formation at LEFT femoral head.

Osseous mineralization normal.

No acute fracture, dislocation or bone destruction.
IMPRESSION: No acute osseous abnormalities.

Minimal degenerative changes LEFT hip joint.

## 2016-05-05 ENCOUNTER — Encounter: Payer: Self-pay | Admitting: Internal Medicine

## 2016-05-05 ENCOUNTER — Other Ambulatory Visit (INDEPENDENT_AMBULATORY_CARE_PROVIDER_SITE_OTHER): Payer: Medicare Other

## 2016-05-05 ENCOUNTER — Ambulatory Visit (INDEPENDENT_AMBULATORY_CARE_PROVIDER_SITE_OTHER): Payer: Medicare Other | Admitting: Internal Medicine

## 2016-05-05 VITALS — BP 138/80 | HR 67 | Temp 98.0°F | Resp 20 | Wt 197.5 lb

## 2016-05-05 DIAGNOSIS — R972 Elevated prostate specific antigen [PSA]: Secondary | ICD-10-CM | POA: Diagnosis not present

## 2016-05-05 DIAGNOSIS — I480 Paroxysmal atrial fibrillation: Secondary | ICD-10-CM

## 2016-05-05 DIAGNOSIS — Z23 Encounter for immunization: Secondary | ICD-10-CM | POA: Diagnosis not present

## 2016-05-05 DIAGNOSIS — R945 Abnormal results of liver function studies: Secondary | ICD-10-CM

## 2016-05-05 DIAGNOSIS — I10 Essential (primary) hypertension: Secondary | ICD-10-CM | POA: Diagnosis not present

## 2016-05-05 DIAGNOSIS — K219 Gastro-esophageal reflux disease without esophagitis: Secondary | ICD-10-CM | POA: Diagnosis not present

## 2016-05-05 DIAGNOSIS — R7989 Other specified abnormal findings of blood chemistry: Secondary | ICD-10-CM

## 2016-05-05 LAB — BASIC METABOLIC PANEL
BUN: 19 mg/dL (ref 6–23)
CO2: 34 mEq/L — ABNORMAL HIGH (ref 19–32)
Calcium: 9.7 mg/dL (ref 8.4–10.5)
Chloride: 99 mEq/L (ref 96–112)
Creatinine, Ser: 1.1 mg/dL (ref 0.40–1.50)
GFR: 70.44 mL/min (ref >60.00)
Glucose, Bld: 108 mg/dL — ABNORMAL HIGH (ref 70–99)
POTASSIUM: 3.7 meq/L (ref 3.5–5.1)
Sodium: 142 mEq/L (ref 135–145)

## 2016-05-05 LAB — HEPATIC FUNCTION PANEL
ALT: 38 U/L (ref 0–53)
AST: 29 U/L (ref 0–37)
Albumin: 4.9 g/dL (ref 3.5–5.2)
Alkaline Phosphatase: 83 U/L (ref 39–117)
BILIRUBIN DIRECT: 0.2 mg/dL (ref 0.0–0.3)
BILIRUBIN TOTAL: 0.9 mg/dL (ref 0.2–1.2)
Total Protein: 7.7 g/dL (ref 6.0–8.3)

## 2016-05-05 LAB — LIPID PANEL
CHOL/HDL RATIO: 4
Cholesterol: 195 mg/dL (ref 0–200)
HDL: 52.9 mg/dL (ref >39.00)
LDL Cholesterol: 111 mg/dL — ABNORMAL HIGH (ref 0–99)
NonHDL: 142.27
TRIGLYCERIDES: 155 mg/dL — AB (ref 0.0–149.0)
VLDL: 31 mg/dL (ref 0.0–40.0)

## 2016-05-05 LAB — PSA: PSA: 2.78 ng/mL (ref 0.10–4.00)

## 2016-05-05 NOTE — Assessment & Plan Note (Signed)
BPH related Lab Results  Component Value Date   PSA 3.67 11/04/2015   PSA 2.24 05/05/2015   PSA 2.04 07/08/2014

## 2016-05-05 NOTE — Assessment & Plan Note (Signed)
Loose wt Labs 

## 2016-05-05 NOTE — Progress Notes (Signed)
Subjective:  Patient ID: William [name], male    DOB: 18-Jul-1945  Age: 70 y.o. MRN: [id number]  CC: No chief complaint on file.   HPI Marks presents for HTN, dyslipidemia, ED f/u  Outpatient Medications Prior to Visit  Medication Sig Dispense Refill  . atorvastatin (LIPITOR) 20 MG tablet Take 1 tablet (20 mg total) by mouth daily. 90 tablet 3  . Cholecalciferol 1000 UNITS tablet Take 1,000 Units by mouth daily.      Marland Kitchen ELIQUIS 5 MG TABS tablet Take 1 tablet by mouth two  times daily 180 tablet 3  . losartan-hydrochlorothiazide (HYZAAR) 100-25 MG tablet Take 1 tablet by mouth  daily 90 tablet 3  . potassium chloride SA (K-DUR,KLOR-CON) 20 MEQ tablet Take 1 tablet by mouth  daily 90 tablet 3  . vardenafil (LEVITRA) 20 MG tablet Take 0.5-1 tablets (10-20 mg total) by mouth daily as needed. 30 tablet 3   No facility-administered medications prior to visit.     ROS Review of Systems  Constitutional: Negative for appetite change, fatigue and unexpected weight change.  HENT: Negative for congestion, nosebleeds, sneezing, sore throat and trouble swallowing.   Eyes: Negative for itching and visual disturbance.  Respiratory: Negative for cough.   Cardiovascular: Negative for chest pain, palpitations and leg swelling.  Gastrointestinal: Negative for abdominal distention, blood in stool, diarrhea and nausea.  Genitourinary: Negative for frequency and hematuria.  Musculoskeletal: Negative for back pain, gait problem, joint swelling and neck pain.  Skin: Negative for rash.  Neurological: Negative for dizziness, tremors, speech difficulty and weakness.  Psychiatric/Behavioral: Negative for agitation, dysphoric mood, sleep disturbance and suicidal ideas. The patient is not nervous/anxious.     Objective:  BP 138/80   Pulse 67   Temp 98 F (36.7 C) (Oral)   Resp 20   Wt 197 lb 8 oz (89.6 kg)   SpO2 96%   BMI 31.88 kg/m   BP Readings from Last 3 Encounters:  05/05/16 138/80    11/04/15 120/74  07/17/15 138/90    Wt Readings from Last 3 Encounters:  05/05/16 197 lb 8 oz (89.6 kg)  11/04/15 195 lb (88.5 kg)  07/17/15 201 lb (91.2 kg)    Physical Exam  Constitutional: He is oriented to person, place, and time. He appears well-developed. No distress.  NAD  HENT:  Mouth/Throat: Oropharynx is clear and moist.  Eyes: Conjunctivae are normal. Pupils are equal, round, and reactive to light.  Neck: Normal range of motion. No JVD present. No thyromegaly present.  Cardiovascular: Normal rate, regular rhythm, normal heart sounds and intact distal pulses.  Exam reveals no gallop and no friction rub.   No murmur heard. Pulmonary/Chest: Effort normal and breath sounds normal. No respiratory distress. He has no wheezes. He has no rales. He exhibits no tenderness.  Abdominal: Soft. Bowel sounds are normal. He exhibits no distension and no mass. There is no tenderness. There is no rebound and no guarding.  Musculoskeletal: Normal range of motion. He exhibits no edema or tenderness.  Lymphadenopathy:    He has no cervical adenopathy.  Neurological: He is alert and oriented to person, place, and time. He has normal reflexes. No cranial nerve deficit. He exhibits normal muscle tone. He displays a negative Romberg sign. Coordination and gait normal.  Skin: Skin is warm and dry. No rash noted.  Psychiatric: He has a normal mood and affect. His behavior is normal. Judgment and thought content normal.    Lab Results  Component Value Date   WBC 5.7 11/04/2015   HGB 15.8 11/04/2015   HCT 45.9 11/04/2015   PLT 239.0 11/04/2015   GLUCOSE 115 (H) 11/04/2015   CHOL 198 11/04/2015   TRIG 172.0 (H) 11/04/2015   HDL 55.70 11/04/2015   LDLCALC 108 (H) 11/04/2015   ALT 64 (H) 11/04/2015   AST 50 (H) 11/04/2015   NA 143 11/04/2015   K 4.0 11/04/2015   CL 99 11/04/2015   CREATININE 1.21 11/04/2015   BUN 19 11/04/2015   CO2 34 (H) 11/04/2015   TSH 3.05 11/04/2015   PSA 3.67  11/04/2015   HGBA1C 6.1 07/06/2012    No results found.  Assessment & Plan:   Diagnoses and all orders for this visit:  Encounter for immunization -     Flu Vaccine QUAD 36+ mos IM   I am having Mr. Yoho maintain his Cholecalciferol, ELIQUIS, vardenafil, atorvastatin, potassium chloride SA, and losartan-hydrochlorothiazide.  No orders of the defined types were placed in this encounter.    Follow-up: No Follow-up on file.  Walker Kehr, MD

## 2016-05-05 NOTE — Assessment & Plan Note (Signed)
Doing well 

## 2016-05-05 NOTE — Progress Notes (Signed)
Pre visit review using our clinic review tool, if applicable. No additional management support is needed unless otherwise documented below in the visit note. 

## 2016-05-05 NOTE — Assessment & Plan Note (Signed)
On Eliquis

## 2016-05-05 NOTE — Assessment & Plan Note (Signed)
Losartan HCT 

## 2016-05-06 ENCOUNTER — Ambulatory Visit: Payer: Medicare Other | Admitting: Internal Medicine

## 2016-07-20 ENCOUNTER — Encounter: Payer: Self-pay | Admitting: Cardiovascular Disease

## 2016-07-20 ENCOUNTER — Ambulatory Visit (INDEPENDENT_AMBULATORY_CARE_PROVIDER_SITE_OTHER): Payer: Medicare Other | Admitting: Cardiovascular Disease

## 2016-07-20 VITALS — BP 141/86 | HR 80 | Ht 66.0 in | Wt 200.0 lb

## 2016-07-20 DIAGNOSIS — G4733 Obstructive sleep apnea (adult) (pediatric): Secondary | ICD-10-CM | POA: Diagnosis not present

## 2016-07-20 DIAGNOSIS — I480 Paroxysmal atrial fibrillation: Secondary | ICD-10-CM

## 2016-07-20 DIAGNOSIS — I10 Essential (primary) hypertension: Secondary | ICD-10-CM | POA: Diagnosis not present

## 2016-07-20 DIAGNOSIS — E785 Hyperlipidemia, unspecified: Secondary | ICD-10-CM | POA: Diagnosis not present

## 2016-07-20 NOTE — Progress Notes (Signed)
Patient ID: William [name], male   DOB: 13-Apr-1946, 71 y.o.   MRN: [id number]     Cardiology Office Note    Date:  07/20/2016   ID:  William, [name] [birth year], MRN [id number]  PCP:  William Kehr, MD  Cardiologist:   Sanda Klein, MD   Chief Complaint  Patient presents with  . Follow-up    History of Present Illness:  William [name] is a 71 y.o. male with previous episodes of paroxysmal atrial fibrillation who returns for routine follow-up. Since his last appointment he Had to "bad" episodes of atrial fibrillation in April. They resolve spontaneously. He has not had any palpitations in the last 9 months. 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 He is much more active in his yard and still goes to [employer]Marland Kitchen He has not had any focal neurological events to suggest embolic stroke or TIA. He has not had any serious bleeding problems. Treatment with beta blockers in the past led to a symptomatic junctional escape rhythm and he is currently not on any rate control medications. His blood pressure is well controlled and he takes a statin for hyperlipidemia. Dose of atorvastatin was decreased in half a few months ago when he had transient liver test abnormalities.    Past Medical History:  Diagnosis Date  . Colon polyps    Dr Sharlett Iles  . GERD (gastroesophageal reflux disease)   . HTN (hypertension)   . Hyperlipidemia   . OSA on CPAP   . Prostatitis     Past Surgical History:  Procedure Laterality Date  . APPENDECTOMY  2012   09/2010 in [place]  . ROTATOR CUFF REPAIR  2008   left; Dr Para March    Outpatient Medications Prior to Visit  Medication Sig Dispense Refill  . atorvastatin (LIPITOR) 20 MG tablet Take 1 tablet (20 mg total) by mouth daily. 90 tablet 3  . Cholecalciferol 1000 UNITS tablet Take 1,000 Units by mouth daily.      Marland Kitchen ELIQUIS 5 MG TABS tablet Take 1 tablet by mouth two  times daily 180 tablet 3  .  losartan-hydrochlorothiazide (HYZAAR) 100-25 MG tablet Take 1 tablet by mouth  daily 90 tablet 3  . potassium chloride SA (K-DUR,KLOR-CON) 20 MEQ tablet Take 1 tablet by mouth  daily 90 tablet 3  . vardenafil (LEVITRA) 20 MG tablet Take 0.5-1 tablets (10-20 mg total) by mouth daily as needed. 30 tablet 3   No facility-administered medications prior to visit.      Allergies:   Benazepril hcl   Social History   Social History  . Marital status: Married    Spouse name: N/A  . Number of children: 3  . Years of education: N/A   Occupational History  . Accountant    Social History Main Topics  . Smoking status: Never Smoker  . Smokeless tobacco: Never Used  . Alcohol use No  . Drug use: No  . Sexual activity: Yes   Other Topics Concern  . None   Social History Narrative   Regular Exercise - Yes, gym              Family History:  The patient's family history includes Asthma in his son; Coronary artery disease in his mother; Heart disease (age of onset: 46) in his father and mother; Hypertension in his other; Stroke in his sister.   ROS:   Please see the history of  present illness.    ROS All other systems reviewed and are negative.   PHYSICAL EXAM:   VS:  BP (!) 141/86 (BP Location: Right Arm, Patient Position: Sitting, Cuff Size: Normal)   Pulse 80   Ht 5\' 6"  (1.676 m)   Wt 90.7 kg (200 lb)   BMI 32.28 kg/m    GEN: Well nourished, well developed, in no acute distress  HEENT: normal  Neck: no JVD, carotid bruits, or masses Cardiac: RRR; no murmurs, rubs, or gallops,no edema  Respiratory:  clear to auscultation bilaterally, normal work of breathing GI: soft, nontender, nondistended, + BS MS: no deformity or atrophy  Skin: warm and dry, no rash Neuro:  Alert and Oriented x 3, Strength and sensation are intact Psych: euthymic mood, full affect  Wt Readings from Last 3 Encounters:  07/20/16 90.7 kg (200 lb)  05/05/16 89.6 kg (197 lb 8 oz)  11/04/15 88.5 kg (195  lb)      Studies/Labs Reviewed:   EKG:  EKG is ordered today.  The ekg ordered today demonstrates NSR, nonspecific ST segment changes (old), QTC 431 ms.  Recent Labs: 11/04/2015: Hemoglobin 15.8; Platelets 239.0; TSH 3.05 05/05/2016: ALT 38; BUN 19; Creatinine, Ser 1.10; Potassium 3.7; Sodium 142   Lipid Panel    Component Value Date/Time   CHOL 195 05/05/2016 0834   TRIG 155.0 (H) 05/05/2016 0834   TRIG 67 05/03/2006 0753   HDL 52.90 05/05/2016 0834   CHOLHDL 4 05/05/2016 0834   VLDL 31.0 05/05/2016 0834   LDLCALC 111 (H) 05/05/2016 0834     ASSESSMENT:    1. Paroxysmal atrial fibrillation (HCC)   2. Obstructive sleep apnea   3. Dyslipidemia   4. Essential hypertension      PLAN:  In order of problems listed above:  1. PAF: No recent symptomatic events; tolerating anticoagulation well. Avoid rate control medications due to the previous events when he took a beta blocker. In the absence of symptoms, aggressive antiarrhythmic therapy (either pharmacologically or with ablation) is not indicated.  2. OSA: reports compliance with CPAP  3. HLP: no evidence of coronary disease or other structural cardiac abnormalities by previous workup; target LDL less than 100. Now on atorvastatin 20 mg daily, due to transient abnormalities in transaminases. LDL slightly above target. Weight loss and regular exercise recommended. 4. HTN: Blood pressure with fair control. Usual BP at home is around 130/80.  Medication Adjustments/Labs and Tests Ordered: Current medicines are reviewed at length with the patient today.  Concerns regarding medicines are outlined above.  Medication changes, Labs and Tests ordered today are listed in the Patient Instructions below. Patient Instructions  Dr Sallyanne Kuster recommends that you schedule a follow-up appointment in 1 year. You will receive a reminder letter in the mail two months in advance. If you don't receive a letter, please call our office to schedule the  follow-up appointment.  If you need a refill on your cardiac medications before your next appointment, please call your pharmacy.      Signed, Sanda Klein, MD  07/20/2016 10:27 AM    [address], [place],   [address] Phone: [phone]; Fax: [phone]

## 2016-07-20 NOTE — Patient Instructions (Signed)
Dr Croitoru recommends that you schedule a follow-up appointment in 1 year. You will receive a reminder letter in the mail two months in advance. If you don't receive a letter, please call our office to schedule the follow-up appointment.  If you need a refill on your cardiac medications before your next appointment, please call your pharmacy. 

## 2016-08-03 ENCOUNTER — Other Ambulatory Visit: Payer: Self-pay | Admitting: Cardiovascular Disease

## 2016-08-20 ENCOUNTER — Ambulatory Visit (INDEPENDENT_AMBULATORY_CARE_PROVIDER_SITE_OTHER): Payer: Medicare Other | Admitting: Pulmonary Disease

## 2016-08-20 ENCOUNTER — Encounter: Payer: Self-pay | Admitting: Pulmonary Disease

## 2016-08-20 VITALS — BP 122/84 | HR 86 | Ht 66.0 in | Wt 199.0 lb

## 2016-08-20 DIAGNOSIS — R059 Cough, unspecified: Secondary | ICD-10-CM

## 2016-08-20 DIAGNOSIS — G4733 Obstructive sleep apnea (adult) (pediatric): Secondary | ICD-10-CM | POA: Diagnosis not present

## 2016-08-20 DIAGNOSIS — R05 Cough: Secondary | ICD-10-CM

## 2016-08-20 DIAGNOSIS — Z9989 Dependence on other enabling machines and devices: Secondary | ICD-10-CM | POA: Diagnosis not present

## 2016-08-20 NOTE — Patient Instructions (Addendum)
Will arrange for new CPAP machine and supplies Follow up in 3 months 

## 2016-08-20 NOTE — Progress Notes (Signed)
Current Outpatient Prescriptions on File Prior to Visit  Medication Sig  . atorvastatin (LIPITOR) 20 MG tablet Take 1 tablet (20 mg total) by mouth daily.  Marland Kitchen ELIQUIS 5 MG TABS tablet TAKE 1 TABLET BY MOUTH TWO  TIMES DAILY  . losartan-hydrochlorothiazide (HYZAAR) 100-25 MG tablet Take 1 tablet by mouth  daily  . potassium chloride SA (K-DUR,KLOR-CON) 20 MEQ tablet Take 1 tablet by mouth  daily  . vardenafil (LEVITRA) 20 MG tablet Take 0.5-1 tablets (10-20 mg total) by mouth daily as needed.  . Cholecalciferol 1000 UNITS tablet Take 1,000 Units by mouth daily.     No current facility-administered medications on file prior to visit.      Chief Complaint  Patient presents with  . SLEEP CONSULT    Former [place] patient, last seen 2013. Wears CPAP nightly. Sleep Study 2009. DME: [facility] ; Epworth Score: 4     Sleep tests PSG 10/04/07 >> AHI 36   Cardiac tests Echo 09/26/14 >> EF 65 to 70%, grade 1 DD  Past medical history GERD, HTN, HLD  Past surgical history, Family history, Social history, Allergies all reviewed  Vital Signs BP 122/84 (BP Location: Left Arm, Cuff Size: Normal)   Pulse 86   Ht 5\' 6"  (1.676 m)   Wt 199 lb (90.3 kg)   SpO2 95%   BMI 32.12 kg/m   History of Present Illness William [name] is a 71 y.o. male with obstructive sleep apnea.  He was previously seen by Dr. Gwenette Greet.  He had sleep study in 2009 and showed severe OSA.  He has been on CPAP 12 cm H2O ever since.  He does well with CPAP.  Has nasal mask.  Was told by his DME that he needed f/u to get supplies.  He goes to bed at 10 pm, and falls asleep quickly.  Sleeps through the night.  Wakes up at 7 am.  Feels rested.  Doesn't take naps.  Not using anything to help sleep or stay awake.   Physical Exam  General - No distress Eyes - wears glasses ENT - No sinus tenderness, no oral exudate, no LAN, MP 3 Cardiac - s1s2 regular, no murmur Chest - No wheeze/rales/dullness Back - No focal tenderness Abd - Soft,  non-tender Ext - No edema Neuro - Normal strength Skin - No rashes Psych - normal mood, and behavior   Assessment/Plan  Obstructive sleep apnea. - We discussed how sleep apnea can affect various health problems, including risks for hypertension, cardiovascular disease, and diabetes.  We also discussed how sleep disruption can increase risks for accidents, such as while driving.  Weight loss as a means of improving sleep apnea was also reviewed.  Additional treatment options discussed were CPAP therapy, oral appliance, and surgical intervention. - his CPAP machine is more than 70 yrs old >> will arrange for new machine; explained that he might need repeat sleep study for insurance purposes, and that he should be able to do home sleep study - will continue CPAP 12 cm H2O - will arrange for new CPAP mask and supplies    Patient Instructions  Will arrange for new CPAP machine and supplies  Follow up in 3 months    William Mires, MD  Pulmonary/Critical Care/Sleep Pager:  [phone] 08/20/2016, 12:12 PM

## 2016-10-08 ENCOUNTER — Other Ambulatory Visit: Payer: Self-pay | Admitting: Internal Medicine

## 2016-10-11 NOTE — Telephone Encounter (Signed)
Routing to dr plotnikov, please advise, thanks 

## 2016-11-01 ENCOUNTER — Telehealth: Payer: Self-pay | Admitting: Internal Medicine

## 2016-11-01 NOTE — Telephone Encounter (Signed)
Called patient regarding appt scheduled for 11/09/2016. Patient was originally scheduled for Medicare Wellness with Dr. Camila Li. Changed appt to office visit. Pt will need to schedule AWV with health coach.

## 2016-11-09 ENCOUNTER — Other Ambulatory Visit (INDEPENDENT_AMBULATORY_CARE_PROVIDER_SITE_OTHER): Payer: Medicare Other

## 2016-11-09 ENCOUNTER — Ambulatory Visit (INDEPENDENT_AMBULATORY_CARE_PROVIDER_SITE_OTHER): Payer: Medicare Other | Admitting: Internal Medicine

## 2016-11-09 ENCOUNTER — Encounter: Payer: Self-pay | Admitting: Internal Medicine

## 2016-11-09 VITALS — BP 146/88 | HR 70 | Temp 98.3°F | Ht 66.0 in | Wt 197.0 lb

## 2016-11-09 DIAGNOSIS — E785 Hyperlipidemia, unspecified: Secondary | ICD-10-CM | POA: Diagnosis not present

## 2016-11-09 DIAGNOSIS — R739 Hyperglycemia, unspecified: Secondary | ICD-10-CM

## 2016-11-09 DIAGNOSIS — I10 Essential (primary) hypertension: Secondary | ICD-10-CM

## 2016-11-09 DIAGNOSIS — I480 Paroxysmal atrial fibrillation: Secondary | ICD-10-CM

## 2016-11-09 DIAGNOSIS — K219 Gastro-esophageal reflux disease without esophagitis: Secondary | ICD-10-CM

## 2016-11-09 LAB — BASIC METABOLIC PANEL
BUN: 18 mg/dL (ref 6–23)
CO2: 32 mEq/L (ref 19–32)
Calcium: 9.5 mg/dL (ref 8.4–10.5)
Chloride: 101 mEq/L (ref 96–112)
Creatinine, Ser: 1.07 mg/dL (ref 0.40–1.50)
GFR: 72.62 mL/min (ref >60.00)
GLUCOSE: 120 mg/dL — AB (ref 70–99)
POTASSIUM: 3.5 meq/L (ref 3.5–5.1)
SODIUM: 141 meq/L (ref 135–145)

## 2016-11-09 LAB — HEPATIC FUNCTION PANEL
ALT: 38 U/L (ref 0–53)
AST: 27 U/L (ref 0–37)
Albumin: 4.6 g/dL (ref 3.5–5.2)
Alkaline Phosphatase: 84 U/L (ref 39–117)
BILIRUBIN DIRECT: 0.2 mg/dL (ref 0.0–0.3)
BILIRUBIN TOTAL: 0.6 mg/dL (ref 0.2–1.2)
TOTAL PROTEIN: 7.3 g/dL (ref 6.0–8.3)

## 2016-11-09 LAB — LIPID PANEL
CHOLESTEROL: 170 mg/dL (ref 0–200)
HDL: 45.6 mg/dL (ref >39.00)
LDL CALC: 87 mg/dL (ref 0–99)
NonHDL: 123.9
Total CHOL/HDL Ratio: 4
Triglycerides: 184 mg/dL — ABNORMAL HIGH (ref 0.0–149.0)
VLDL: 36.8 mg/dL (ref 0.0–40.0)

## 2016-11-09 MED ORDER — VARDENAFIL HCL 20 MG PO TABS: 10.0000 mg | ORAL_TABLET | Freq: Every day | ORAL | 3 refills | Status: DC | PRN

## 2016-11-09 MED ORDER — POTASSIUM CHLORIDE CRYS ER 20 MEQ PO TBCR: 20.0000 meq | EXTENDED_RELEASE_TABLET | Freq: Every day | ORAL | 3 refills | Status: DC

## 2016-11-09 NOTE — Assessment & Plan Note (Signed)
Doing well 

## 2016-11-09 NOTE — Assessment & Plan Note (Signed)
Eliquis 

## 2016-11-09 NOTE — Assessment & Plan Note (Signed)
Losartan HCT 

## 2016-11-09 NOTE — Progress Notes (Signed)
Subjective:  Patient ID: William [name], male    DOB: October 05, 1945  Age: 71 y.o. MRN: [id number]  CC: No chief complaint on file.   HPI William [name] presents for HTN, dyslipidemia, CAD f/u  Outpatient Medications Prior to Visit  Medication Sig Dispense Refill  . atorvastatin (LIPITOR) 20 MG tablet Take 1 tablet (20 mg total) by mouth daily. 90 tablet 3  . Cholecalciferol 1000 UNITS tablet Take 1,000 Units by mouth daily.      Marland Kitchen ELIQUIS 5 MG TABS tablet TAKE 1 TABLET BY MOUTH TWO  TIMES DAILY 180 tablet 1  . losartan-hydrochlorothiazide (HYZAAR) 100-25 MG tablet Take 1 tablet by mouth  daily 90 tablet 3  . potassium chloride SA (K-DUR,KLOR-CON) 20 MEQ tablet Take 1 tablet by mouth  daily 90 tablet 3  . vardenafil (LEVITRA) 20 MG tablet Take 0.5-1 tablets (10-20 mg total) by mouth daily as needed. 30 tablet 3   No facility-administered medications prior to visit.     ROS Review of Systems  Constitutional: Negative for appetite change, fatigue and unexpected weight change.  HENT: Negative for congestion, nosebleeds, sneezing, sore throat and trouble swallowing.   Eyes: Negative for itching and visual disturbance.  Respiratory: Negative for cough.   Cardiovascular: Negative for chest pain, palpitations and leg swelling.  Gastrointestinal: Negative for abdominal distention, blood in stool, diarrhea and nausea.  Genitourinary: Negative for frequency and hematuria.  Musculoskeletal: Negative for back pain, gait problem, joint swelling and neck pain.  Skin: Negative for rash.  Neurological: Negative for dizziness, tremors, speech difficulty and weakness.  Psychiatric/Behavioral: Negative for agitation, dysphoric mood and sleep disturbance. The patient is not nervous/anxious.     Objective:  BP (!) 146/88 (BP Location: Left Arm, Patient Position: Sitting, Cuff Size: Normal)   Pulse 70   Temp 98.3 F (36.8 C) (Oral)   Ht 5\' 6"  (1.676 m)   Wt 197 lb 0.6 oz (89.4 kg)   SpO2 96%    BMI 31.80 kg/m   BP Readings from Last 3 Encounters:  11/09/16 (!) 146/88  08/20/16 122/84  07/20/16 (!) 141/86    Wt Readings from Last 3 Encounters:  11/09/16 197 lb 0.6 oz (89.4 kg)  08/20/16 199 lb (90.3 kg)  07/20/16 200 lb (90.7 kg)    Physical Exam  Constitutional: He is oriented to person, place, and time. He appears well-developed. No distress.  NAD  HENT:  Mouth/Throat: Oropharynx is clear and moist.  Eyes: Conjunctivae are normal. Pupils are equal, round, and reactive to light.  Neck: Normal range of motion. No JVD present. No thyromegaly present.  Cardiovascular: Normal rate, regular rhythm, normal heart sounds and intact distal pulses.  Exam reveals no gallop and no friction rub.   No murmur heard. Pulmonary/Chest: Effort normal and breath sounds normal. No respiratory distress. He has no wheezes. He has no rales. He exhibits no tenderness.  Abdominal: Soft. Bowel sounds are normal. He exhibits no distension and no mass. There is no tenderness. There is no rebound and no guarding.  Musculoskeletal: Normal range of motion. He exhibits no edema or tenderness.  Lymphadenopathy:    He has no cervical adenopathy.  Neurological: He is alert and oriented to person, place, and time. He has normal reflexes. No cranial nerve deficit. He exhibits normal muscle tone. He displays a negative Romberg sign. Coordination and gait normal.  Skin: Skin is warm and dry. No rash noted.  Psychiatric: He has a normal mood and affect. His behavior  is normal. Judgment and thought content normal.    Lab Results  Component Value Date   WBC 5.7 11/04/2015   HGB 15.8 11/04/2015   HCT 45.9 11/04/2015   PLT 239.0 11/04/2015   GLUCOSE 108 (H) 05/05/2016   CHOL 195 05/05/2016   TRIG 155.0 (H) 05/05/2016   HDL 52.90 05/05/2016   LDLCALC 111 (H) 05/05/2016   ALT 38 05/05/2016   AST 29 05/05/2016   NA 142 05/05/2016   K 3.7 05/05/2016   CL 99 05/05/2016   CREATININE 1.10 05/05/2016   BUN  19 05/05/2016   CO2 34 (H) 05/05/2016   TSH 3.05 11/04/2015   PSA 2.78 05/05/2016   HGBA1C 6.1 07/06/2012    No results found.  Assessment & Plan:   There are no diagnoses linked to this encounter. I am having Mr. Mckesson maintain his Cholecalciferol, vardenafil, atorvastatin, potassium chloride SA, losartan-hydrochlorothiazide, and ELIQUIS.  No orders of the defined types were placed in this encounter.    Follow-up: No Follow-up on file.  Walker Kehr, MD

## 2016-11-09 NOTE — Assessment & Plan Note (Signed)
Labs

## 2016-11-23 ENCOUNTER — Other Ambulatory Visit: Payer: Self-pay | Admitting: Internal Medicine

## 2016-11-23 ENCOUNTER — Other Ambulatory Visit: Payer: Self-pay | Admitting: Cardiovascular Disease

## 2016-11-25 ENCOUNTER — Encounter: Payer: Self-pay | Admitting: Pulmonary Disease

## 2016-11-25 ENCOUNTER — Ambulatory Visit (INDEPENDENT_AMBULATORY_CARE_PROVIDER_SITE_OTHER): Payer: Medicare Other | Admitting: Pulmonary Disease

## 2016-11-25 VITALS — BP 132/94 | HR 68 | Ht 66.0 in | Wt 198.2 lb

## 2016-11-25 DIAGNOSIS — Z9989 Dependence on other enabling machines and devices: Secondary | ICD-10-CM | POA: Diagnosis not present

## 2016-11-25 DIAGNOSIS — G4733 Obstructive sleep apnea (adult) (pediatric): Secondary | ICD-10-CM

## 2016-11-25 NOTE — Patient Instructions (Signed)
Call if you want to try a different type of CPAP mask  Follow up in 1 year 

## 2016-11-25 NOTE — Progress Notes (Signed)
Current Outpatient Prescriptions on File Prior to Visit  Medication Sig  . atorvastatin (LIPITOR) 20 MG tablet TAKE 1 TABLET BY MOUTH  DAILY  . Cholecalciferol 1000 UNITS tablet Take 1,000 Units by mouth daily.    Marland Kitchen ELIQUIS 5 MG TABS tablet TAKE 1 TABLET BY MOUTH TWO  TIMES DAILY  . losartan-hydrochlorothiazide (HYZAAR) 100-25 MG tablet TAKE 1 TABLET BY MOUTH  DAILY  . potassium chloride SA (K-DUR,KLOR-CON) 20 MEQ tablet Take 1 tablet (20 mEq total) by mouth daily.  . vardenafil (LEVITRA) 20 MG tablet Take 0.5-1 tablets (10-20 mg total) by mouth daily as needed.   No current facility-administered medications on file prior to visit.      Chief Complaint  Patient presents with  . Follow-up    Pt states that he is wearing CPAP nightly. Denies problems with pressure. Pt states that the mask seems to no fit well. Pt states that he may just need to get used to it but it seems to not fit as well as previous masks. DME [facility]     Sleep tests PSG 10/04/07 >> AHI 36  CPAP 10/26/16 to 11/24/16 >> used on 30 of 30 nights with average 8 hrs 38 min.  Average AHI 0.4 with CPAP 12 cm H2O  Cardiac tests Echo 09/26/14 >> EF 65 to 70%, grade 1 DD  Past medical history GERD, HTN, HLD  Past surgical history, Family history, Social history, Allergies all reviewed  Vital Signs BP (!) 132/94 (BP Location: Left Arm, Cuff Size: Normal)   Pulse 68   Ht 5\' 6"  (1.676 m)   Wt 198 lb 3.2 oz (89.9 kg)   SpO2 94%   BMI 31.99 kg/m   History of Present Illness William [name] is a 71 y.o. male with obstructive sleep apnea.  He uses CPAP nightly.  Has nasal mask. Sometimes gets air leak.  Otherwise sleeping well.   Physical Exam  General - pleasant Eyes - pupils reactive ENT - no sinus tenderness, no oral exudate, no LAN, MP 3 Cardiac - regular, no murmur Chest - no wheeze, rales Abd - soft, non tender Ext - no edema Skin - no rashes Neuro - normal strength Psych - normal  mood    Assessment/Plan  Obstructive sleep apnea. - he is compliant with CPAP and reports benefit  - continue CPAP 12 cm H2O - he will call if he wants to try a different type of mask   Patient Instructions  Call if you want to try a different type of CPAP mask  Follow up in 1 year    Chesley Mires, MD [place] Pager:  [phone] 11/25/2016, 11:30 AM

## 2017-05-12 ENCOUNTER — Other Ambulatory Visit (INDEPENDENT_AMBULATORY_CARE_PROVIDER_SITE_OTHER): Payer: Medicare Other

## 2017-05-12 ENCOUNTER — Ambulatory Visit (INDEPENDENT_AMBULATORY_CARE_PROVIDER_SITE_OTHER): Payer: Medicare Other | Admitting: Internal Medicine

## 2017-05-12 ENCOUNTER — Encounter: Payer: Self-pay | Admitting: Internal Medicine

## 2017-05-12 VITALS — BP 134/78 | HR 71 | Temp 98.6°F | Ht 66.0 in | Wt 200.0 lb

## 2017-05-12 DIAGNOSIS — R739 Hyperglycemia, unspecified: Secondary | ICD-10-CM

## 2017-05-12 DIAGNOSIS — I10 Essential (primary) hypertension: Secondary | ICD-10-CM

## 2017-05-12 DIAGNOSIS — Z23 Encounter for immunization: Secondary | ICD-10-CM

## 2017-05-12 DIAGNOSIS — R972 Elevated prostate specific antigen [PSA]: Secondary | ICD-10-CM | POA: Diagnosis not present

## 2017-05-12 DIAGNOSIS — I480 Paroxysmal atrial fibrillation: Secondary | ICD-10-CM | POA: Diagnosis not present

## 2017-05-12 DIAGNOSIS — E785 Hyperlipidemia, unspecified: Secondary | ICD-10-CM | POA: Diagnosis not present

## 2017-05-12 DIAGNOSIS — R7989 Other specified abnormal findings of blood chemistry: Secondary | ICD-10-CM

## 2017-05-12 DIAGNOSIS — R945 Abnormal results of liver function studies: Secondary | ICD-10-CM

## 2017-05-12 LAB — LIPID PANEL
CHOLESTEROL: 213 mg/dL — AB (ref 0–200)
HDL: 54.7 mg/dL (ref >39.00)
LDL CALC: 126 mg/dL — AB (ref 0–99)
NonHDL: 158.69
Total CHOL/HDL Ratio: 4
Triglycerides: 165 mg/dL — ABNORMAL HIGH (ref 0.0–149.0)
VLDL: 33 mg/dL (ref 0.0–40.0)

## 2017-05-12 LAB — BASIC METABOLIC PANEL
BUN: 19 mg/dL (ref 6–23)
CHLORIDE: 100 meq/L (ref 96–112)
CO2: 34 meq/L — AB (ref 19–32)
Calcium: 9.8 mg/dL (ref 8.4–10.5)
Creatinine, Ser: 1.1 mg/dL (ref 0.40–1.50)
GFR: 70.23 mL/min (ref >60.00)
Glucose, Bld: 133 mg/dL — ABNORMAL HIGH (ref 70–99)
POTASSIUM: 4.3 meq/L (ref 3.5–5.1)
Sodium: 142 mEq/L (ref 135–145)

## 2017-05-12 LAB — PSA: PSA: 2.67 ng/mL (ref 0.10–4.00)

## 2017-05-12 LAB — HEMOGLOBIN A1C: Hgb A1c MFr Bld: 6.4 % (ref 4.6–6.5)

## 2017-05-12 LAB — HEPATIC FUNCTION PANEL
ALBUMIN: 4.7 g/dL (ref 3.5–5.2)
ALK PHOS: 85 U/L (ref 39–117)
ALT: 48 U/L (ref 0–53)
AST: 31 U/L (ref 0–37)
Bilirubin, Direct: 0.1 mg/dL (ref 0.0–0.3)
TOTAL PROTEIN: 7.5 g/dL (ref 6.0–8.3)
Total Bilirubin: 0.8 mg/dL (ref 0.2–1.2)

## 2017-05-12 MED ORDER — ATORVASTATIN CALCIUM 20 MG PO TABS: 20.0000 mg | ORAL_TABLET | Freq: Every day | ORAL | 3 refills | Status: DC

## 2017-05-12 NOTE — Assessment & Plan Note (Signed)
Labs

## 2017-05-12 NOTE — Assessment & Plan Note (Signed)
Eliqis

## 2017-05-12 NOTE — Assessment & Plan Note (Signed)
PSA

## 2017-05-12 NOTE — Patient Instructions (Signed)
Wt Readings from Last 3 Encounters:  05/12/17 200 lb (90.7 kg)  11/25/16 198 lb 3.2 oz (89.9 kg)  11/09/16 197 lb 0.6 oz (89.4 kg)

## 2017-05-12 NOTE — Assessment & Plan Note (Signed)
Lipitor 

## 2017-05-12 NOTE — Assessment & Plan Note (Signed)
Losartan 

## 2017-05-12 NOTE — Assessment & Plan Note (Signed)
A1c

## 2017-05-12 NOTE — Addendum Note (Signed)
Addended by: Karren Cobble on: 05/12/2017 10:10 AM   Modules accepted: Orders

## 2017-05-12 NOTE — Progress Notes (Signed)
Subjective:  Patient ID: William [name], male    DOB: Dec 16, 1945  Age: 71 y.o. MRN: [id number]  CC: No chief complaint on file.   HPI William [name] presents for CAD, dyslipidemia, HTN f/u  Outpatient Medications Prior to Visit  Medication Sig Dispense Refill  . atorvastatin (LIPITOR) 20 MG tablet TAKE 1 TABLET BY MOUTH  DAILY 90 tablet 3  . Cholecalciferol 1000 UNITS tablet Take 1,000 Units by mouth daily.      Marland Kitchen ELIQUIS 5 MG TABS tablet TAKE 1 TABLET BY MOUTH TWO  TIMES DAILY 180 tablet 2  . losartan-hydrochlorothiazide (HYZAAR) 100-25 MG tablet TAKE 1 TABLET BY MOUTH  DAILY 90 tablet 1  . potassium chloride SA (K-DUR,KLOR-CON) 20 MEQ tablet Take 1 tablet (20 mEq total) by mouth daily. 90 tablet 3  . vardenafil (LEVITRA) 20 MG tablet Take 0.5-1 tablets (10-20 mg total) by mouth daily as needed. 30 tablet 3   No facility-administered medications prior to visit.     ROS Review of Systems  Constitutional: Negative for appetite change, fatigue and unexpected weight change.  HENT: Negative for congestion, nosebleeds, sneezing, sore throat and trouble swallowing.   Eyes: Negative for itching and visual disturbance.  Respiratory: Negative for cough.   Cardiovascular: Negative for chest pain, palpitations and leg swelling.  Gastrointestinal: Negative for abdominal distention, blood in stool, diarrhea and nausea.  Genitourinary: Negative for frequency and hematuria.  Musculoskeletal: Negative for back pain, gait problem, joint swelling and neck pain.  Skin: Negative for rash.  Neurological: Negative for dizziness, tremors, speech difficulty and weakness.  Psychiatric/Behavioral: Negative for agitation, dysphoric mood and sleep disturbance. The patient is not nervous/anxious.     Objective:  BP 134/78 (BP Location: Left Arm, Patient Position: Sitting, Cuff Size: Large)   Pulse 71   Temp 98.6 F (37 C) (Oral)   Ht 5\' 6"  (1.676 m)   Wt 200 lb (90.7 kg)   SpO2 96%   BMI 32.28 kg/m    BP Readings from Last 3 Encounters:  05/12/17 134/78  11/25/16 (!) 132/94  11/09/16 (!) 146/88    Wt Readings from Last 3 Encounters:  05/12/17 200 lb (90.7 kg)  11/25/16 198 lb 3.2 oz (89.9 kg)  11/09/16 197 lb 0.6 oz (89.4 kg)    Physical Exam  Constitutional: He is oriented to person, place, and time. He appears well-developed. No distress.  NAD  HENT:  Mouth/Throat: Oropharynx is clear and moist.  Eyes: Conjunctivae are normal. Pupils are equal, round, and reactive to light.  Neck: Normal range of motion. No JVD present. No thyromegaly present.  Cardiovascular: Normal rate, regular rhythm, normal heart sounds and intact distal pulses. Exam reveals no gallop and no friction rub.  No murmur heard. Pulmonary/Chest: Effort normal and breath sounds normal. No respiratory distress. He has no wheezes. He has no rales. He exhibits no tenderness.  Abdominal: Soft. Bowel sounds are normal. He exhibits no distension and no mass. There is no tenderness. There is no rebound and no guarding.  Musculoskeletal: Normal range of motion. He exhibits no edema or tenderness.  Lymphadenopathy:    He has no cervical adenopathy.  Neurological: He is alert and oriented to person, place, and time. He has normal reflexes. No cranial nerve deficit. He exhibits normal muscle tone. He displays a negative Romberg sign. Coordination and gait normal.  Skin: Skin is warm and dry. No rash noted.  Psychiatric: He has a normal mood and affect. His behavior is normal. Judgment  and thought content normal.    Lab Results  Component Value Date   WBC 5.7 11/04/2015   HGB 15.8 11/04/2015   HCT 45.9 11/04/2015   PLT 239.0 11/04/2015   GLUCOSE 120 (H) 11/09/2016   CHOL 170 11/09/2016   TRIG 184.0 (H) 11/09/2016   HDL 45.60 11/09/2016   LDLCALC 87 11/09/2016   ALT 38 11/09/2016   AST 27 11/09/2016   NA 141 11/09/2016   K 3.5 11/09/2016   CL 101 11/09/2016   CREATININE 1.07 11/09/2016   BUN 18 11/09/2016    CO2 32 11/09/2016   TSH 3.05 11/04/2015   PSA 2.78 05/05/2016   HGBA1C 6.1 07/06/2012    No results found.  Assessment & Plan:   There are no diagnoses linked to this encounter. I am having William [name] maintain his Cholecalciferol, potassium chloride SA, vardenafil, atorvastatin, ELIQUIS, and losartan-hydrochlorothiazide.  No orders of the defined types were placed in this encounter.    Follow-up: No Follow-up on file.  Walker Kehr, MD

## 2017-05-13 ENCOUNTER — Encounter: Payer: Self-pay | Admitting: Internal Medicine

## 2017-05-15 ENCOUNTER — Other Ambulatory Visit: Payer: Self-pay | Admitting: Internal Medicine

## 2017-05-15 DIAGNOSIS — R739 Hyperglycemia, unspecified: Secondary | ICD-10-CM

## 2017-05-15 DIAGNOSIS — E785 Hyperlipidemia, unspecified: Secondary | ICD-10-CM

## 2017-05-24 ENCOUNTER — Other Ambulatory Visit: Payer: Self-pay | Admitting: Internal Medicine

## 2017-08-15 ENCOUNTER — Encounter: Payer: Self-pay | Admitting: Cardiovascular Disease

## 2017-08-15 ENCOUNTER — Ambulatory Visit (INDEPENDENT_AMBULATORY_CARE_PROVIDER_SITE_OTHER): Payer: Medicare Other | Admitting: Cardiovascular Disease

## 2017-08-15 VITALS — BP 134/84 | HR 90 | Ht 66.0 in | Wt 198.2 lb

## 2017-08-15 DIAGNOSIS — G4733 Obstructive sleep apnea (adult) (pediatric): Secondary | ICD-10-CM

## 2017-08-15 DIAGNOSIS — E7800 Pure hypercholesterolemia, unspecified: Secondary | ICD-10-CM

## 2017-08-15 DIAGNOSIS — I480 Paroxysmal atrial fibrillation: Secondary | ICD-10-CM

## 2017-08-15 DIAGNOSIS — I10 Essential (primary) hypertension: Secondary | ICD-10-CM

## 2017-08-15 DIAGNOSIS — R9431 Abnormal electrocardiogram [ECG] [EKG]: Secondary | ICD-10-CM

## 2017-08-15 DIAGNOSIS — Z7901 Long term (current) use of anticoagulants: Secondary | ICD-10-CM | POA: Diagnosis not present

## 2017-08-15 LAB — BASIC METABOLIC PANEL
BUN/Creatinine Ratio: 15 (ref 10–24)
BUN: 16 mg/dL (ref 8–27)
CALCIUM: 9.5 mg/dL (ref 8.6–10.2)
CO2: 29 mmol/L (ref 20–29)
Chloride: 97 mmol/L (ref 96–106)
Creatinine, Ser: 1.04 mg/dL (ref 0.76–1.27)
GFR calc Af Amer: 84 mL/min/{1.73_m2} (ref >59)
GFR, EST NON AFRICAN AMERICAN: 72 mL/min/{1.73_m2} (ref >59)
Glucose: 136 mg/dL — ABNORMAL HIGH (ref 65–99)
POTASSIUM: 4.2 mmol/L (ref 3.5–5.2)
Sodium: 143 mmol/L (ref 134–144)

## 2017-08-15 LAB — MAGNESIUM: Magnesium: 2.2 mg/dL (ref 1.6–2.3)

## 2017-08-15 NOTE — Progress Notes (Signed)
Patient ID: William [name], male   DOB: April 05, 1946, 72 y.o.   MRN: [id number]     Cardiology Office Note    Date:  08/15/2017   ID:  Fredrich, Cory 05/19/1946, MRN [id number]  PCP:  Cassandria Anger, MD  Cardiologist:   Sanda Klein, MD   Chief Complaint  Patient presents with  . 1 yr f/u visit    pt states no Sx.    History of Present Illness:  William [name] is a 72 y.o. male with previous episodes of paroxysmal atrial fibrillation who returns for routine follow-up.   He has not had any clinical episodes of atrial fibrillation in the last year.  He generally feels great.  He continues to go to the gym 2 or 3 days a week, exercising for about an hour each time.  He does a combination of walking/jogging for about 2 miles and then uses the weight machines intermittently.  He denies any exertional angina, dyspnea, palpitations, dizziness or syncope.  He has not had leg edema or claudication.  He is still recovering from a protracted "cold", associated with a residual dry cough. He has not had any focal neurological events to suggest embolic stroke or TIA.  He denies any bleeding problems and is compliant with his anticoagulant.  Treatment with beta blockers in the past led to a symptomatic junctional escape rhythm and he is currently not on any rate control medications. His blood pressure is well controlled and he takes a statin for hyperlipidemia.  When his dose of statin was reduced for transient abnormality in the liver test, his LDL increased to 126.  He is now back on the full dose of statin.  His electrocardiogram today shows diffuse nonspecific "scooped out" ST segment depression in a long QT at around 510 ms.  He reports that potassium supplement had to be added to his losartan-HCTZ because of persistent hyperkalemia.  He last had labs checked in November and his potassium was normal at that time.  Past Medical History:  Diagnosis Date  . Colon polyps    Dr Sharlett Iles  .  GERD (gastroesophageal reflux disease)   . HTN (hypertension)   . Hyperlipidemia   . OSA on CPAP   . Prostatitis     Past Surgical History:  Procedure Laterality Date  . APPENDECTOMY  2012   09/2010 in [place]  . ROTATOR CUFF REPAIR  2008   left; Dr Para March    Outpatient Medications Prior to Visit  Medication Sig Dispense Refill  . atorvastatin (LIPITOR) 20 MG tablet Take 1 tablet (20 mg total) daily by mouth. 90 tablet 3  . Cholecalciferol 1000 UNITS tablet Take 1,000 Units by mouth daily.      Marland Kitchen ELIQUIS 5 MG TABS tablet TAKE 1 TABLET BY MOUTH TWO  TIMES DAILY 180 tablet 2  . losartan-hydrochlorothiazide (HYZAAR) 100-25 MG tablet TAKE 1 TABLET BY MOUTH  DAILY 90 tablet 3  . potassium chloride SA (K-DUR,KLOR-CON) 20 MEQ tablet Take 1 tablet (20 mEq total) by mouth daily. 90 tablet 3  . vardenafil (LEVITRA) 20 MG tablet Take 0.5-1 tablets (10-20 mg total) by mouth daily as needed. 30 tablet 3   No facility-administered medications prior to visit.      Allergies:   Benazepril hcl   Social History   Socioeconomic History  . Marital status: Married    Spouse name: None  . Number of children: 3  . Years of education: None  . Highest education  level: None  Social Needs  . Financial resource strain: None  . Food insecurity - worry: None  . Food insecurity - inability: None  . Transportation needs - medical: None  . Transportation needs - non-medical: None  Occupational History  . Occupation: Optometrist  Tobacco Use  . Smoking status: Never Smoker  . Smokeless tobacco: Never Used  Substance and Sexual Activity  . Alcohol use: No  . Drug use: No  . Sexual activity: Yes  Other Topics Concern  . None  Social History Narrative   Regular Exercise - Yes, gym              Family History:  The patient's family history includes Asthma in his son; Coronary artery disease in his mother; Heart disease (age of onset: 60) in his father and mother; Hypertension in his other; Stroke  in his sister.   ROS:   Please see the history of present illness.    ROS All other systems reviewed and are negative.   PHYSICAL EXAM:   VS:  BP 134/84 (BP Location: Left Arm, Patient Position: Sitting, Cuff Size: Large)   Pulse 90   Ht 5\' 6"  (1.676 m)   Wt 198 lb 3.2 oz (89.9 kg)   BMI 31.99 kg/m     General: Alert, oriented x3, no distress, obese Head: no evidence of trauma, PERRL, EOMI, no exophtalmos or lid lag, no myxedema, no xanthelasma; normal ears, nose and oropharynx Neck: normal jugular venous pulsations and no hepatojugular reflux; brisk carotid pulses without delay and no carotid bruits Chest: clear to auscultation, no signs of consolidation by percussion or palpation, normal fremitus, symmetrical and full respiratory excursions Cardiovascular: normal position and quality of the apical impulse, regular rhythm, normal first and second heart sounds, no murmurs, rubs or gallops Abdomen: no tenderness or distention, no masses by palpation, no abnormal pulsatility or arterial bruits, normal bowel sounds, no hepatosplenomegaly Extremities: no clubbing, cyanosis or edema; 2+ radial, ulnar and brachial pulses bilaterally; 2+ right femoral, posterior tibial and dorsalis pedis pulses; 2+ left femoral, posterior tibial and dorsalis pedis pulses; no subclavian or femoral bruits Neurological: grossly nonfocal Psych: Normal mood and affect   Wt Readings from Last 3 Encounters:  08/15/17 198 lb 3.2 oz (89.9 kg)  05/12/17 200 lb (90.7 kg)  11/25/16 198 lb 3.2 oz (89.9 kg)      Studies/Labs Reviewed:   EKG:  EKG is ordered today.  The ekg ordered today demonstrates NSR, nonspecific ST segment changes are not new, but are more pronounced today,, QTC 513 ms.  Recent Labs: 05/12/2017: ALT 48; BUN 19; Creatinine, Ser 1.10; Potassium 4.3; Sodium 142   Lipid Panel    Component Value Date/Time   CHOL 213 (H) 05/12/2017 0952   TRIG 165.0 (H) 05/12/2017 0952   TRIG 67 05/03/2006  0753   HDL 54.70 05/12/2017 0952   CHOLHDL 4 05/12/2017 0952   VLDL 33.0 05/12/2017 0952   LDLCALC 126 (H) 05/12/2017 0952     ASSESSMENT:    1. Paroxysmal atrial fibrillation (HCC)   2. Long term (current) use of anticoagulants   3. OSA (obstructive sleep apnea)   4. Hypercholesteremia   5. Essential hypertension   6. Long QT interval      PLAN:  In order of problems listed above:  1. PAF: Asymptomatic.  Appropriately anticoagulated.CHADSVasc 2 (age, HTN).  2. Eliquis: Well-tolerated without bleeding problems. 3. OSA: reports compliance with CPAP  4. HLP: no evidence of coronary disease or  other structural cardiac abnormalities by previous workup; target LDL less than 100.  Statin recently increased back to previous level and plan in place for follow-up check with Dr. Alain Marion. 5. HTN: Blood pressure with fair control. Usual BP at home is around 130/80.  Similar value today. 6. Long QT: Check his potassium today and replenish it if diminished.  However if hypokalemic does not explain his ECG changes I would recommend a treadmill stress test,  even though he is asymptomatic.  Medication Adjustments/Labs and Tests Ordered: Current medicines are reviewed at length with the patient today.  Concerns regarding medicines are outlined above.  Medication changes, Labs and Tests ordered today are listed in the Patient Instructions below. Patient Instructions  Dr Sallyanne Kuster recommends that you continue on your current medications as directed. Please refer to the Current Medication list given to you today.  Your physician recommends that you return for lab work TODAY.  Dr Sallyanne Kuster recommends that you schedule a follow-up appointment in 12 months. You will receive a reminder letter in the mail two months in advance. If you don't receive a letter, please call our office to schedule the follow-up appointment.  If you need a refill on your cardiac medications before your next appointment,  please call your pharmacy.      Signed, Sanda Klein, MD  08/15/2017 12:13 PM    [address], [place], [place]  [address] Phone: [phone]; Fax: [phone]

## 2017-08-15 NOTE — Patient Instructions (Signed)
Dr Croitoru recommends that you continue on your current medications as directed. Please refer to the Current Medication list given to you today.  Your physician recommends that you return for lab work TODAY.  Dr Croitoru recommends that you schedule a follow-up appointment in 12 months. You will receive a reminder letter in the mail two months in advance. If you don't receive a letter, please call our office to schedule the follow-up appointment.  If you need a refill on your cardiac medications before your next appointment, please call your pharmacy. 

## 2017-09-05 ENCOUNTER — Telehealth: Payer: Self-pay | Admitting: Cardiovascular Disease

## 2017-09-05 DIAGNOSIS — R9431 Abnormal electrocardiogram [ECG] [EKG]: Secondary | ICD-10-CM

## 2017-09-05 NOTE — Telephone Encounter (Signed)
Pt rtn call to [facility] regarding lab work-pls call

## 2017-09-05 NOTE — Telephone Encounter (Signed)
-----   Message from Sanda Klein, MD sent at 08/15/2017  6:22 PM EST ----- Labs, including potassium and magnesium are normal. Please schedule for treadmill ECG test for abnormal ECG. Thank you MCr

## 2017-09-05 NOTE — Telephone Encounter (Signed)
Spoke to patient. Result given . Verbalized understanding Aware needs gxt schedule. Order placed.

## 2017-09-09 ENCOUNTER — Telehealth (HOSPITAL_COMMUNITY): Payer: Self-pay

## 2017-09-09 NOTE — Telephone Encounter (Signed)
Encounter complete. 

## 2017-09-14 ENCOUNTER — Ambulatory Visit (HOSPITAL_COMMUNITY)
Admission: RE | Admit: 2017-09-14 | Discharge: 2017-09-14 | Disposition: A | Discharge status: Home / Self Care | Payer: Medicare Other | Source: Ambulatory Visit | Attending: Cardiovascular Disease | Admitting: Cardiovascular Disease

## 2017-09-14 ENCOUNTER — Other Ambulatory Visit: Payer: Self-pay

## 2017-09-14 ENCOUNTER — Encounter (HOSPITAL_COMMUNITY): Payer: Self-pay | Admitting: *Deleted

## 2017-09-14 DIAGNOSIS — R9439 Abnormal result of other cardiovascular function study: Secondary | ICD-10-CM

## 2017-09-14 DIAGNOSIS — R9431 Abnormal electrocardiogram [ECG] [EKG]: Secondary | ICD-10-CM | POA: Insufficient documentation

## 2017-09-14 LAB — EXERCISE TOLERANCE TEST
CHL RATE OF PERCEIVED EXERTION: 17
CSEPED: 10 min
CSEPEDS: 2 s
CSEPEW: 11.7 METS
MPHR: 150 {beats}/min
Peak HR: 164 {beats}/min
Percent HR: 109 %
Rest HR: 68 {beats}/min

## 2017-09-14 NOTE — Progress Notes (Signed)
Abnormal ETT shown to Dr. Sallyanne Kuster who spoke with pt regarding further testing.

## 2017-09-16 ENCOUNTER — Telehealth: Payer: Self-pay

## 2017-09-16 DIAGNOSIS — R9431 Abnormal electrocardiogram [ECG] [EKG]: Secondary | ICD-10-CM

## 2017-09-16 NOTE — Telephone Encounter (Signed)
-----   Message from Sanda Klein, MD sent at 09/14/2017 10:25 AM EDT ----- Please schedule for coronary CT angiogram for abnormal ECG stress test.  Needs Creatinine check and metoprolol 50 mg PO, 1 hour before test. MCr

## 2017-09-25 ENCOUNTER — Other Ambulatory Visit: Payer: Self-pay | Admitting: Cardiovascular Disease

## 2017-09-30 MED ORDER — METOPROLOL TARTRATE 50 MG PO TABS: 50.0000 mg | ORAL_TABLET | Freq: Once | ORAL | 0 refills | Status: DC

## 2017-09-30 NOTE — Addendum Note (Signed)
Addended by: Diana Eves on: 09/30/2017 03:18 PM   Modules accepted: Orders

## 2017-09-30 NOTE — Telephone Encounter (Signed)
Patient scheduled for 10/06/17 at 1:30p. Patient aware. Patient advised of need for blood work - he will come on Monday, 10/03/17.   Rx for metoprolol sent to patient's preferred pharmacy electronically.  CT letter drafted and released to mychart.

## 2017-10-03 LAB — BASIC METABOLIC PANEL
BUN/Creatinine Ratio: 19 (ref 10–24)
BUN: 20 mg/dL (ref 8–27)
CALCIUM: 9.2 mg/dL (ref 8.6–10.2)
CO2: 29 mmol/L (ref 20–29)
CREATININE: 1.04 mg/dL (ref 0.76–1.27)
Chloride: 100 mmol/L (ref 96–106)
GFR calc Af Amer: 84 mL/min/{1.73_m2} (ref >59)
GFR, EST NON AFRICAN AMERICAN: 72 mL/min/{1.73_m2} (ref >59)
Glucose: 96 mg/dL (ref 65–99)
Potassium: 4.2 mmol/L (ref 3.5–5.2)
Sodium: 143 mmol/L (ref 134–144)

## 2017-10-04 ENCOUNTER — Encounter: Payer: Self-pay | Admitting: Gastroenterology

## 2017-10-06 ENCOUNTER — Ambulatory Visit (HOSPITAL_COMMUNITY): Payer: Medicare Other

## 2017-10-07 ENCOUNTER — Ambulatory Visit (HOSPITAL_COMMUNITY): Payer: Medicare Other

## 2017-10-17 ENCOUNTER — Ambulatory Visit (HOSPITAL_COMMUNITY): Payer: Medicare Other

## 2017-10-24 ENCOUNTER — Ambulatory Visit (HOSPITAL_COMMUNITY)
Admission: RE | Admit: 2017-10-24 | Discharge: 2017-10-24 | Disposition: A | Discharge status: Home / Self Care | Payer: Medicare Other | Source: Ambulatory Visit | Attending: Cardiovascular Disease | Admitting: Cardiovascular Disease

## 2017-10-24 ENCOUNTER — Encounter (HOSPITAL_COMMUNITY): Payer: Self-pay

## 2017-10-24 DIAGNOSIS — R9439 Abnormal result of other cardiovascular function study: Secondary | ICD-10-CM | POA: Diagnosis not present

## 2017-10-24 DIAGNOSIS — I2510 Atherosclerotic heart disease of native coronary artery without angina pectoris: Secondary | ICD-10-CM | POA: Insufficient documentation

## 2017-10-24 DIAGNOSIS — R9431 Abnormal electrocardiogram [ECG] [EKG]: Secondary | ICD-10-CM | POA: Diagnosis not present

## 2017-10-24 MED ORDER — IOPAMIDOL (ISOVUE-370) INJECTION 76%
100.0000 mL | Freq: Once | INTRAVENOUS | Status: AC | PRN
  Administered 2017-10-24: 80 mL via INTRAVENOUS

## 2017-10-24 MED ORDER — NITROGLYCERIN 0.4 MG SL SUBL
SUBLINGUAL_TABLET | SUBLINGUAL | Status: AC
  Administered 2017-10-24: 0.8 mg via SUBLINGUAL
  Filled 2017-10-24: qty 2

## 2017-10-24 MED ORDER — NITROGLYCERIN 0.4 MG SL SUBL
0.8000 mg | SUBLINGUAL_TABLET | Freq: Once | SUBLINGUAL | Status: AC
  Administered 2017-10-24: 0.8 mg via SUBLINGUAL

## 2017-10-24 MED ORDER — IOPAMIDOL (ISOVUE-370) INJECTION 76%
INTRAVENOUS | Status: AC
  Filled 2017-10-24: qty 100

## 2017-10-26 ENCOUNTER — Telehealth: Payer: Self-pay | Admitting: Cardiovascular Disease

## 2017-10-26 NOTE — Telephone Encounter (Signed)
New Message   Patient is calling to obtain information about his recent CT. Please call to discuss.

## 2017-10-26 NOTE — Telephone Encounter (Signed)
Returned call to patient. Initial results reviewed with patient and wife. FFR pending. Patient verbalized understanding and agreed with plan.

## 2017-10-27 ENCOUNTER — Encounter: Payer: Self-pay | Admitting: Cardiovascular Disease

## 2017-10-27 NOTE — Progress Notes (Signed)
Reviewed the CT results with Dr. Meda Coffee. Although there is suspicion for a left circumflex stenosis, the vessel is small and he is unlikely to benefit from invasive coronary angio at this time. Will increase intensity of statin therapy - target LDL<70. Not on aspirin due to need for Eliquis. Had junctional rhythm with beta blockers in the past. Asymptomatic. Plan cardiac cath if he develops angina. Unable to leave him a message - voicemail is not activated. Will try again.  Sanda Klein, MD, [facility] [phone] office [phone] pager

## 2017-10-31 ENCOUNTER — Telehealth: Payer: Self-pay

## 2017-10-31 MED ORDER — ATORVASTATIN CALCIUM 40 MG PO TABS: 40.0000 mg | ORAL_TABLET | Freq: Every day | ORAL | 3 refills | Status: DC

## 2017-10-31 NOTE — Telephone Encounter (Signed)
Rx(s) sent to pharmacy electronically.  

## 2017-10-31 NOTE — Telephone Encounter (Signed)
Please ask him to increase the atorvastatin to 40 mg daily and get labs as planned with Dr. Alain Marion.  MCr

## 2017-11-01 ENCOUNTER — Other Ambulatory Visit: Payer: Self-pay | Admitting: Internal Medicine

## 2017-11-14 ENCOUNTER — Other Ambulatory Visit (INDEPENDENT_AMBULATORY_CARE_PROVIDER_SITE_OTHER): Payer: Medicare Other

## 2017-11-14 ENCOUNTER — Encounter: Payer: Self-pay | Admitting: Internal Medicine

## 2017-11-14 ENCOUNTER — Ambulatory Visit (INDEPENDENT_AMBULATORY_CARE_PROVIDER_SITE_OTHER): Payer: Medicare Other | Admitting: Internal Medicine

## 2017-11-14 VITALS — BP 130/74 | HR 70 | Temp 97.7°F | Ht 66.0 in | Wt 186.0 lb

## 2017-11-14 DIAGNOSIS — R7309 Other abnormal glucose: Secondary | ICD-10-CM

## 2017-11-14 DIAGNOSIS — R972 Elevated prostate specific antigen [PSA]: Secondary | ICD-10-CM | POA: Diagnosis not present

## 2017-11-14 DIAGNOSIS — Z860100 Personal history of colon polyps, unspecified: Secondary | ICD-10-CM

## 2017-11-14 DIAGNOSIS — Z0000 Encounter for general adult medical examination without abnormal findings: Secondary | ICD-10-CM

## 2017-11-14 DIAGNOSIS — R945 Abnormal results of liver function studies: Secondary | ICD-10-CM

## 2017-11-14 DIAGNOSIS — E785 Hyperlipidemia, unspecified: Secondary | ICD-10-CM

## 2017-11-14 DIAGNOSIS — R7989 Other specified abnormal findings of blood chemistry: Secondary | ICD-10-CM

## 2017-11-14 DIAGNOSIS — K219 Gastro-esophageal reflux disease without esophagitis: Secondary | ICD-10-CM

## 2017-11-14 LAB — CBC WITH DIFFERENTIAL/PLATELET
BASOS ABS: 0.1 10*3/uL (ref 0.0–0.1)
Basophils Relative: 1.1 % (ref 0.0–3.0)
Eosinophils Absolute: 0.3 10*3/uL (ref 0.0–0.7)
Eosinophils Relative: 5.6 % — ABNORMAL HIGH (ref 0.0–5.0)
HEMATOCRIT: 44.6 % (ref 39.0–52.0)
HEMOGLOBIN: 15.4 g/dL (ref 13.0–17.0)
LYMPHS PCT: 24.8 % (ref 12.0–46.0)
Lymphs Abs: 1.4 10*3/uL (ref 0.7–4.0)
MCHC: 34.4 g/dL (ref 30.0–36.0)
MCV: 87.8 fl (ref 78.0–100.0)
MONOS PCT: 6.7 % (ref 3.0–12.0)
Monocytes Absolute: 0.4 10*3/uL (ref 0.1–1.0)
NEUTROS ABS: 3.6 10*3/uL (ref 1.4–7.7)
Neutrophils Relative %: 61.8 % (ref 43.0–77.0)
PLATELETS: 166 10*3/uL (ref 150.0–400.0)
RBC: 5.09 Mil/uL (ref 4.22–5.81)
RDW: 14 % (ref 11.5–15.5)
WBC: 5.8 10*3/uL (ref 4.0–10.5)

## 2017-11-14 LAB — HEPATIC FUNCTION PANEL
ALT: 41 U/L (ref 0–53)
AST: 27 U/L (ref 0–37)
Albumin: 4.4 g/dL (ref 3.5–5.2)
Alkaline Phosphatase: 79 U/L (ref 39–117)
BILIRUBIN DIRECT: 0.1 mg/dL (ref 0.0–0.3)
BILIRUBIN TOTAL: 0.7 mg/dL (ref 0.2–1.2)
TOTAL PROTEIN: 7.1 g/dL (ref 6.0–8.3)

## 2017-11-14 LAB — URINALYSIS, ROUTINE W REFLEX MICROSCOPIC
BILIRUBIN URINE: NEGATIVE
KETONES UR: NEGATIVE
NITRITE: NEGATIVE
RBC / HPF: NONE SEEN (ref 0–?)
SPECIFIC GRAVITY, URINE: 1.02 (ref 1.000–1.030)
Total Protein, Urine: NEGATIVE
URINE GLUCOSE: NEGATIVE
UROBILINOGEN UA: 0.2 (ref 0.0–1.0)
pH: 6 (ref 5.0–8.0)

## 2017-11-14 LAB — PSA: PSA: 2.44 ng/mL (ref 0.10–4.00)

## 2017-11-14 LAB — BASIC METABOLIC PANEL
BUN: 20 mg/dL (ref 6–23)
CO2: 33 mEq/L — ABNORMAL HIGH (ref 19–32)
Calcium: 9.4 mg/dL (ref 8.4–10.5)
Chloride: 100 mEq/L (ref 96–112)
Creatinine, Ser: 1.01 mg/dL (ref 0.40–1.50)
GFR: 77.39 mL/min (ref >60.00)
Glucose, Bld: 108 mg/dL — ABNORMAL HIGH (ref 70–99)
Potassium: 4.2 mEq/L (ref 3.5–5.1)
Sodium: 142 mEq/L (ref 135–145)

## 2017-11-14 LAB — LIPID PANEL
Cholesterol: 153 mg/dL (ref 0–200)
HDL: 58.6 mg/dL (ref >39.00)
LDL Cholesterol: 68 mg/dL (ref 0–99)
NonHDL: 93.94
Total CHOL/HDL Ratio: 3
Triglycerides: 132 mg/dL (ref 0.0–149.0)
VLDL: 26.4 mg/dL (ref 0.0–40.0)

## 2017-11-14 LAB — TSH: TSH: 2.36 u[IU]/mL (ref 0.35–4.50)

## 2017-11-14 LAB — HEMOGLOBIN A1C: Hgb A1c MFr Bld: 6 % (ref 4.6–6.5)

## 2017-11-14 MED ORDER — VARDENAFIL HCL 20 MG PO TABS
10.0000 mg | ORAL_TABLET | Freq: Every day | ORAL | 3 refills | Status: DC | PRN
Start: 2017-11-14 — End: 2022-11-01

## 2017-11-14 MED ORDER — ZOSTER VAC RECOMB ADJUVANTED 50 MCG/0.5ML IM SUSR: 0.5000 mL | Freq: Once | INTRAMUSCULAR | 1 refills | Status: AC

## 2017-11-14 NOTE — Assessment & Plan Note (Signed)
Colonoscopy is pending

## 2017-11-14 NOTE — Assessment & Plan Note (Signed)
Labs

## 2017-11-14 NOTE — Assessment & Plan Note (Signed)

## 2017-11-14 NOTE — Assessment & Plan Note (Signed)
Doing well 

## 2017-11-14 NOTE — Assessment & Plan Note (Signed)
Lipitor 

## 2017-11-14 NOTE — Progress Notes (Signed)
Subjective:  Patient ID: William [name], male    DOB: 04-Mar-1946  Age: 72 y.o. MRN: [id number]  CC: No chief complaint on file.   HPI WRAY GOEHRING presents for a well exam F/u CAD, dyslipidemia  Outpatient Medications Prior to Visit  Medication Sig Dispense Refill  . atorvastatin (LIPITOR) 40 MG tablet Take 1 tablet (40 mg total) by mouth daily. 90 tablet 3  . Cholecalciferol 1000 UNITS tablet Take 1,000 Units by mouth daily.      Marland Kitchen ELIQUIS 5 MG TABS tablet TAKE 1 TABLET BY MOUTH TWO  TIMES DAILY 180 tablet 2  . losartan-hydrochlorothiazide (HYZAAR) 100-25 MG tablet TAKE 1 TABLET BY MOUTH  DAILY 90 tablet 3  . potassium chloride SA (K-DUR,KLOR-CON) 20 MEQ tablet TAKE 1 TABLET BY MOUTH  DAILY 90 tablet 3  . vardenafil (LEVITRA) 20 MG tablet Take 0.5-1 tablets (10-20 mg total) by mouth daily as needed. 30 tablet 3  . metoprolol tartrate (LOPRESSOR) 50 MG tablet Take 1 tablet (50 mg total) by mouth once for 1 dose. 1 tablet 0   No facility-administered medications prior to visit.     ROS Review of Systems  Constitutional: Negative for appetite change, fatigue and unexpected weight change.  HENT: Negative for congestion, nosebleeds, sneezing, sore throat and trouble swallowing.   Eyes: Negative for itching and visual disturbance.  Respiratory: Negative for cough.   Cardiovascular: Negative for chest pain, palpitations and leg swelling.  Gastrointestinal: Negative for abdominal distention, blood in stool, diarrhea and nausea.  Genitourinary: Negative for frequency and hematuria.  Musculoskeletal: Positive for arthralgias. Negative for back pain, gait problem, joint swelling and neck pain.  Skin: Negative for rash.  Neurological: Negative for dizziness, tremors, speech difficulty and weakness.  Psychiatric/Behavioral: Negative for agitation, dysphoric mood and sleep disturbance. The patient is not nervous/anxious.     Objective:  BP 130/74 (BP Location: Left Arm, Patient  Position: Sitting, Cuff Size: Large)   Pulse 70   Temp 97.7 F (36.5 C) (Oral)   Ht 5\' 6"  (1.676 m)   Wt 186 lb (84.4 kg)   SpO2 98%   BMI 30.02 kg/m   BP Readings from Last 3 Encounters:  11/14/17 130/74  10/24/17 122/75  08/15/17 134/84    Wt Readings from Last 3 Encounters:  11/14/17 186 lb (84.4 kg)  08/15/17 198 lb 3.2 oz (89.9 kg)  05/12/17 200 lb (90.7 kg)    Physical Exam  Constitutional: He is oriented to person, place, and time. He appears well-developed. No distress.  NAD  HENT:  Mouth/Throat: Oropharynx is clear and moist.  Eyes: Pupils are equal, round, and reactive to light. Conjunctivae are normal.  Neck: Normal range of motion. No JVD present. No thyromegaly present.  Cardiovascular: Normal rate, regular rhythm, normal heart sounds and intact distal pulses. Exam reveals no gallop and no friction rub.  No murmur heard. Pulmonary/Chest: Effort normal and breath sounds normal. No respiratory distress. He has no wheezes. He has no rales. He exhibits no tenderness.  Abdominal: Soft. Bowel sounds are normal. He exhibits no distension and no mass. There is no tenderness. There is no rebound and no guarding.  Genitourinary: Rectum normal. Rectal exam shows guaiac negative stool.  Musculoskeletal: Normal range of motion. He exhibits no edema or tenderness.  Lymphadenopathy:    He has no cervical adenopathy.  Neurological: He is alert and oriented to person, place, and time. He has normal reflexes. No cranial nerve deficit. He exhibits normal muscle tone.  He displays a negative Romberg sign. Coordination and gait normal.  Skin: Skin is warm and dry. No rash noted.  Psychiatric: He has a normal mood and affect. His behavior is normal. Judgment and thought content normal.   Prostate 1-2+  Lab Results  Component Value Date   WBC 5.7 11/04/2015   HGB 15.8 11/04/2015   HCT 45.9 11/04/2015   PLT 239.0 11/04/2015   GLUCOSE 96 10/03/2017   CHOL 213 (H) 05/12/2017    TRIG 165.0 (H) 05/12/2017   HDL 54.70 05/12/2017   LDLCALC 126 (H) 05/12/2017   ALT 48 05/12/2017   AST 31 05/12/2017   NA 143 10/03/2017   K 4.2 10/03/2017   CL 100 10/03/2017   CREATININE 1.04 10/03/2017   BUN 20 10/03/2017   CO2 29 10/03/2017   TSH 3.05 11/04/2015   PSA 2.67 05/12/2017   HGBA1C 6.4 05/12/2017    Ct Coronary Morph W/cta Cor W/score W/ca W/cm &/or Wo/cm  Addendum Date: 10/24/2017   ADDENDUM REPORT: 10/24/2017 17:04 CLINICAL DATA:  32 -year-old male with PAF and abnormal exercise treadmill stress test. EXAM: Cardiac/Coronary  CT TECHNIQUE: The patient was scanned on a [employer]. FINDINGS: A 120 kV prospective scan was triggered in the descending thoracic aorta at 111 HU's. Axial non-contrast 3 mm slices were carried out through the heart. The data set was analyzed on a dedicated work station and scored using the [place]. Gantry rotation speed was 250 msecs and collimation was .6 mm. 5 mg of iv Metoprolol and 0.8 mg of sl NTG was given. The 3D data set was reconstructed in 5% intervals of the 67-82 % of the R-R cycle. Diastolic phases were analyzed on a dedicated work station using MPR, MIP and VRT modes. The patient received 80 cc of contrast. Aorta: Normal size. Mild calcifications in the descending aorta. No dissection. Aortic Valve:  Trileaflet.  No calcifications. Coronary Arteries:  Normal coronary origin.  Right dominance. RCA is a large dominant artery that gives rise to PDA and PLVB. There is minimal calcified plaque with associated stenosis 0-25%. Left main is a large artery that gives rise to LAD and LCX arteries. Distal left main has a mild calcified plaque with stenosis 0-25%. LAD is a large vessel that gives rise to two small diagonal arteries. There is mild mixed diffuse plaque with stenosis 0-25%. 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 LCX is a medium size non-dominant artery. Ostial LCX artery has mild calcified plaque with  associated stenosis 25-50%. Mid LCX artery has mixed plaque with stenosis 50-69%. Other findings: Normal pulmonary vein drainage into the left atrium. Normal let atrial appendage without a thrombus. Normal size of the pulmonary artery. IMPRESSION: 1. Coronary calcium score of 262. This was 21 percentile for age and sex matched control. 2. Normal coronary origin with right dominance. 3. 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 Mid LCX artery has mixed plaque with stenosis 50-69%. Additional analysis with CT FFR will be submitted. 4. 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 Electronically Signed   By: Ena Dawley   On: 10/24/2017 17:04   Result Date: 10/24/2017 EXAM: OVER-READ INTERPRETATION  CT CHEST The following report is an over-read performed by radiologist Dr. Rolm Baptise of [facility], [place] on 10/24/2017. This over-read does not include interpretation of cardiac or coronary anatomy or pathology. The coronary CTA interpretation by the cardiologist is attached. COMPARISON:  None.  FINDINGS: Vascular: Heart is normal size.  Visualized aorta normal caliber. Mediastinum/Nodes: No adenopathy in the lower mediastinum or hila. Lungs/Pleura: Dependent atelectasis in the lower lobes. No effusions. Upper Abdomen: Imaging into the upper abdomen shows no acute findings. Musculoskeletal: Chest wall soft tissues are unremarkable. No acute bony abnormality. IMPRESSION: No acute or significant extracardiac abnormality. Electronically Signed: By: Rolm Baptise M.D. On: 10/24/2017 14:11    Assessment & Plan:   There are no diagnoses linked to this encounter. I am having William [name] maintain his Cholecalciferol, vardenafil, losartan-hydrochlorothiazide, ELIQUIS, metoprolol tartrate, atorvastatin, and potassium chloride SA.  No orders of the defined types were placed in this encounter.    Follow-up: No follow-ups on file.  Walker Kehr, MD

## 2017-11-14 NOTE — Patient Instructions (Signed)

## 2017-11-14 NOTE — Assessment & Plan Note (Signed)
BPH PSA>2 Labs

## 2017-11-17 ENCOUNTER — Other Ambulatory Visit: Payer: Self-pay | Admitting: Internal Medicine

## 2017-11-17 DIAGNOSIS — Z86010 Personal history of colonic polyps: Secondary | ICD-10-CM

## 2017-12-28 ENCOUNTER — Encounter: Payer: Self-pay | Admitting: Gastroenterology

## 2018-01-18 ENCOUNTER — Encounter: Payer: Self-pay | Admitting: Gastroenterology

## 2018-01-18 ENCOUNTER — Ambulatory Visit (INDEPENDENT_AMBULATORY_CARE_PROVIDER_SITE_OTHER): Payer: Medicare Other | Admitting: Gastroenterology

## 2018-01-18 ENCOUNTER — Telehealth: Payer: Self-pay | Admitting: Emergency Medicine

## 2018-01-18 VITALS — BP 140/86 | HR 68 | Ht 66.0 in | Wt 186.0 lb

## 2018-01-18 DIAGNOSIS — Z860100 Personal history of colon polyps, unspecified: Secondary | ICD-10-CM

## 2018-01-18 DIAGNOSIS — Z7901 Long term (current) use of anticoagulants: Secondary | ICD-10-CM | POA: Diagnosis not present

## 2018-01-18 MED ORDER — NA SULFATE-K SULFATE-MG SULF 17.5-3.13-1.6 GM/177ML PO SOLN: 1.0000 | ORAL | 0 refills | Status: DC

## 2018-01-18 NOTE — Progress Notes (Signed)
Thank you for sending this case to me. I have reviewed the entire note, and the outlined plan seems appropriate.   Mckenize Mezera Danis, MD  

## 2018-01-18 NOTE — Progress Notes (Signed)
01/18/2018 William [name] [id number] 1945/07/03   HISTORY OF PRESENT ILLNESS:  This is a 72 year old male who is a previous patient of Dr. Buel Ream.  He is here today to schedule surveillance colonoscopy.  His last colonoscopy was in 09/2012 at which time he was found to have diverticulosis and one pedunculated polyp that was between 5-9 mm in size and was a tubular adenoma.  He is on Eliquis since about 2016 for PAF, prescribed by Dr. Loletha Grayer.  Looks stable with CHADS score of 2.     Past Medical History:  Diagnosis Date  . Colon polyps    Dr Sharlett Iles  . GERD (gastroesophageal reflux disease)   . HTN (hypertension)   . Hyperlipidemia   . OSA on CPAP   . Prostatitis    Past Surgical History:  Procedure Laterality Date  . APPENDECTOMY  2012   09/2010 in [place]  . ROTATOR CUFF REPAIR  2008   left; Dr Para March    reports that he has never smoked. He has never used smokeless tobacco. He reports that he does not drink alcohol or use drugs. family history includes Asthma in his son; Coronary artery disease in his mother; Heart disease (age of onset: 80) in his father and mother; Hypertension in his other; Stroke in his sister. Allergies  Allergen Reactions  . Benazepril Hcl Other (See Comments)    REACTION: cough      Outpatient Encounter Medications as of 01/18/2018  Medication Sig  . atorvastatin (LIPITOR) 40 MG tablet Take 1 tablet (40 mg total) by mouth daily.  . Cholecalciferol 1000 UNITS tablet Take 1,000 Units by mouth daily.    Marland Kitchen ELIQUIS 5 MG TABS tablet TAKE 1 TABLET BY MOUTH TWO  TIMES DAILY  . losartan-hydrochlorothiazide (HYZAAR) 100-25 MG tablet TAKE 1 TABLET BY MOUTH  DAILY  . metoprolol tartrate (LOPRESSOR) 50 MG tablet Take 1 tablet (50 mg total) by mouth once for 1 dose.  . potassium chloride SA (K-DUR,KLOR-CON) 20 MEQ tablet TAKE 1 TABLET BY MOUTH  DAILY  . vardenafil (LEVITRA) 20 MG tablet Take 0.5-1 tablets (10-20 mg total) by mouth daily as needed.   No  facility-administered encounter medications on file as of 01/18/2018.      REVIEW OF SYSTEMS  : All other systems reviewed and negative except where noted in the History of Present Illness.   PHYSICAL EXAM: Ht 5\' 6"  (1.676 m)   Wt 186 lb (84.4 kg)   BMI 30.02 kg/m  General: Well developed white male in no acute distress Head: Normocephalic and atraumatic Eyes:  Sclerae anicteric, conjunctiva pink. Ears: Normal auditory acuity Lungs: Clear throughout to auscultation; no increased WOB. Heart: Regular rate and rhythm; no M/R/G. Abdomen: Soft, non-distended.  BS present.  Non-tender. Rectal:  Will be done at the time of colonoscopy. Musculoskeletal: Symmetrical with no gross deformities  Skin: No lesions on visible extremities Extremities: No edema  Neurological: Alert oriented x 4, grossly non-focal Psychological:  Alert and cooperative. Normal mood and affect  ASSESSMENT AND PLAN: *Personal history of colon polyps:  Tubular adenomas in 09/2012.  Will schedule for colonoscopy with Dr. Loletha Carrow. *Chronic anticoagulation with Eliquis due to atrial fibrillation:  Will hold Eliquis for 2 days prior to endoscopic procedures - will instruct when and how to resume after procedure. Benefits and risks of procedure explained including risks of bleeding, perforation, infection, missed lesions, reactions to medications and possible need for hospitalization and surgery for complications. Additional rare but  real risk of stroke or other vascular clotting events off Eliquis also explained and need to seek urgent help if any signs of these problems occur. Will communicate by phone or EMR with patient's prescribing provider, Dr. Loletha Grayer, to confirm that holding Eliquis is reasonable in this case.    CC:  Plotnikov, Evie Lacks, MD

## 2018-01-18 NOTE — Telephone Encounter (Signed)
[facility] Pre-operative Risk Assessment     Request for surgical clearance:     Endoscopy Procedure  What type of surgery is being performed?     colonoscopy  When is this surgery scheduled?     02-16-18  What type of clearance is required ?   Pharmacy  Are there any medications that need to be held prior to surgery and how long? Eliquis 2 days  Practice name and name of physician performing surgery?      [facility]  What is your office phone and fax number?      Phone- [phone]  [phone]  Anesthesia type (None, local, MAC, general) ?       MAC

## 2018-01-18 NOTE — Patient Instructions (Signed)

## 2018-01-20 NOTE — Telephone Encounter (Signed)
Patient with diagnosis of atrial fibrillation on Eliquis for anticoagulation.    Procedure: colonoscopy Date of procedure: 02/16/18  CHADS2-VASc score of  2 (, HTN, AGE, )  CrCl 80.1 Platelet count 166  Per office protocol, patient can hold Eliquis for 2 days prior to procedure.  (protocol usually recommends to hold x 1 day, however patient has previous 5-9 mm polyp)  If not bridging, patient should restart Eliquis on the evening of procedure or day after, at discretion of procedure MD

## 2018-01-20 NOTE — Telephone Encounter (Signed)
   Primary Cardiologist: Dr Sallyanne Kuster  Chart reviewed as part of pre-operative protocol coverage. Given past medical history and time since last visit, based on ACC/AHA guidelines, [place] would be at acceptable risk for the planned procedure without further cardiovascular testing.   Per office protocol, patient can hold Eliquis for 2 days prior to procedure.  (protocol usually recommends to hold x 1 day, however patient has previous 5-9 mm polyp) If not bridging, patient should restart Eliquis on the evening of procedure or day after, at discretion of procedure MD     I will route this recommendation to the requesting party via [facility] fax function and remove from pre-op pool.  Please call with questions.  Kerin Ransom, PA-C 01/20/2018, 9:38 AM

## 2018-01-20 NOTE — Telephone Encounter (Signed)
Spoke to patient and notified him to hold Eliquis 2 days prior to procedure, pt verbalized understanding.

## 2018-02-16 ENCOUNTER — Encounter: Payer: Medicare Other | Admitting: Gastroenterology

## 2018-03-30 ENCOUNTER — Encounter: Payer: Self-pay | Admitting: Gastroenterology

## 2018-03-30 ENCOUNTER — Ambulatory Visit (AMBULATORY_SURGERY_CENTER): Payer: Medicare Other | Admitting: Gastroenterology

## 2018-03-30 VITALS — BP 125/78 | HR 62 | Temp 96.8°F | Resp 13 | Ht 66.0 in | Wt 185.0 lb

## 2018-03-30 DIAGNOSIS — K635 Polyp of colon: Secondary | ICD-10-CM | POA: Diagnosis not present

## 2018-03-30 DIAGNOSIS — Z86010 Personal history of colonic polyps: Secondary | ICD-10-CM | POA: Diagnosis present

## 2018-03-30 DIAGNOSIS — K621 Rectal polyp: Secondary | ICD-10-CM

## 2018-03-30 DIAGNOSIS — D128 Benign neoplasm of rectum: Secondary | ICD-10-CM

## 2018-03-30 DIAGNOSIS — D125 Benign neoplasm of sigmoid colon: Secondary | ICD-10-CM

## 2018-03-30 DIAGNOSIS — D124 Benign neoplasm of descending colon: Secondary | ICD-10-CM

## 2018-03-30 DIAGNOSIS — D123 Benign neoplasm of transverse colon: Secondary | ICD-10-CM

## 2018-03-30 MED ORDER — SODIUM CHLORIDE 0.9 % IV SOLN: 500.0000 mL | Freq: Once | INTRAVENOUS | Status: DC

## 2018-03-30 NOTE — Op Note (Signed)
[facility] Patient Name: William [name] Procedure Date: 03/30/2018 10:20 AM MRN: [id number] Endoscopist: Mallie Mussel L. Loletha Carrow , MD Age: 72 Referring MD:  Date of Birth: 1946/06/13 Gender: Male Account #: [id number] Procedure:                Colonoscopy Indications:              Surveillance: Personal history of adenomatous                            polyps on last colonoscopy > 5 years ago (TA < 58mm                            09/2012) Medicines:                Monitored Anesthesia Care Procedure:                Pre-Anesthesia Assessment:                           - Prior to the procedure, a History and Physical                            was performed, and patient medications and                            allergies were reviewed. The patient's tolerance of                            previous anesthesia was also reviewed. The risks                            and benefits of the procedure and the sedation                            options and risks were discussed with the patient.                            All questions were answered, and informed consent                            was obtained. Prior Anticoagulants: The patient has                            taken Eliquis (apixaban), last dose was 2 days                            prior to procedure. ASA Grade Assessment: II - A                            patient with mild systemic disease. After reviewing                            the risks and benefits, the patient was deemed in  satisfactory condition to undergo the procedure.                           After obtaining informed consent, the colonoscope                            was passed under direct vision. Throughout the                            procedure, the patient's blood pressure, pulse, and                            oxygen saturations were monitored continuously. The                            Colonoscope was introduced through the anus  and                            advanced to the the terminal ileum, with                            identification of the appendiceal orifice and IC                            valve. The colonoscopy was performed without                            difficulty. The patient tolerated the procedure                            well. The quality of the bowel preparation was                            excellent. The terminal ileum, ileocecal valve,                            appendiceal orifice, and rectum were photographed. Scope In: 10:38:12 AM Scope Out: 10:58:39 AM Scope Withdrawal Time: 0 hours 16 minutes 18 seconds  Total Procedure Duration: 0 hours 20 minutes 27 seconds  Findings:                 The digital rectal exam findings include enlarged,                            firm prostate. No palpable nodules.                           The terminal ileum appeared normal.                           Three sessile polyps were found in the                            recto-sigmoid colon, descending colon and hepatic  flexure. The polyps were 3 to 5 mm in size. These                            polyps were removed with a cold snare. Resection                            and retrieval were complete.                           Two sessile polyps were found in the rectum. The                            polyps were 3 mm in size. These polyps were removed                            with a cold snare. Resection and retrieval were                            complete.                           The exam was otherwise without abnormality on                            direct and retroflexion views. Complications:            No immediate complications. Estimated Blood Loss:     Estimated blood loss was minimal. Impression:               - Enlarged prostate found on digital rectal exam.                           - The examined portion of the ileum was normal.                           -  Three 3 to 5 mm polyps at the recto-sigmoid                            colon, in the descending colon and at the hepatic                            flexure, removed with a cold snare. Resected and                            retrieved.                           - Two 3 mm polyps in the rectum, removed with a                            cold snare. Resected and retrieved.                           - The examination was otherwise normal on direct  and retroflexion views. Recommendation:           - Patient has a contact number available for                            emergencies. The signs and symptoms of potential                            delayed complications were discussed with the                            patient. Return to normal activities tomorrow.                            Written discharge instructions were provided to the                            patient.                           - Resume previous diet.                           - Resume Eliquis (apixaban) at prior dose tomorrow.                           - Await pathology results.                           - Repeat colonoscopy is recommended for                            surveillance. The colonoscopy date will be                            determined after pathology results from today's                            exam become available for review.                           - Patient advised to check with primary care to be                            sure up to date on prostate cancer screening. William [name] L. Loletha Carrow, MD 03/30/2018 11:07:42 AM This report has been signed electronically.

## 2018-03-30 NOTE — Progress Notes (Signed)
A/ox3 pleased with MAC, report to RN 

## 2018-03-30 NOTE — Progress Notes (Signed)
Called to room to assist during endoscopic procedure.  Patient ID and intended procedure confirmed with present staff. Received instructions for my participation in the procedure from the performing physician.  

## 2018-03-30 NOTE — Patient Instructions (Signed)
YOU HAD AN ENDOSCOPIC PROCEDURE TODAY AT [facility]:   Refer to the procedure report that was given to you for any specific questions about what was found during the examination.  If the procedure report does not answer your questions, please call your gastroenterologist to clarify.  If you requested that your care partner not be given the details of your procedure findings, then the procedure report has been included in a sealed envelope for you to review at your convenience later.  YOU SHOULD EXPECT: Some feelings of bloating in the abdomen. Passage of more gas than usual.  Walking can help get rid of the air that was put into your GI tract during the procedure and reduce the bloating. If you had a lower endoscopy (such as a colonoscopy or flexible sigmoidoscopy) you may notice spotting of blood in your stool or on the toilet paper. If you underwent a bowel prep for your procedure, you may not have a normal bowel movement for a few days.  Please Note:  You might notice some irritation and congestion in your nose or some drainage.  This is from the oxygen used during your procedure.  There is no need for concern and it should clear up in a day or so.  SYMPTOMS TO REPORT IMMEDIATELY:   Following lower endoscopy (colonoscopy or flexible sigmoidoscopy):  Excessive amounts of blood in the stool  Significant tenderness or worsening of abdominal pains  Swelling of the abdomen that is new, acute  Fever of 100F or higher  For urgent or emergent issues, a gastroenterologist can be reached at any hour by calling [phone].   DIET:  We do recommend a small meal at first, but then you may proceed to your regular diet.  Drink plenty of fluids but you should avoid alcoholic beverages for 24 hours.  ACTIVITY:  You should plan to take it easy for the rest of today and you should NOT DRIVE or use heavy machinery until tomorrow (because of the sedation medicines used during the test).     FOLLOW UP: Our staff will call the number listed on your records the next business day following your procedure to check on you and address any questions or concerns that you may have regarding the information given to you following your procedure. If we do not reach you, we will leave a message.  However, if you are feeling well and you are not experiencing any problems, there is no need to return our call.  We will assume that you have returned to your regular daily activities without incident.  If any biopsies were taken you will be contacted by phone or by letter within the next 1-3 weeks.  Please call us at [phone] if you have not heard about the biopsies in 3 weeks.   Await for biopsy results Polyps (handout given) Resume Eliquis at prior dose tomorrow Patient advised to check with primary care to be sure to be up to date on prostate cancer screening.  SIGNATURES/CONFIDENTIALITY: You and/or your care partner have signed paperwork which will be entered into your electronic medical record.  These signatures attest to the fact that that the information above on your After Visit Summary has been reviewed and is understood.  Full responsibility of the confidentiality of this discharge information lies with you and/or your care-partner.

## 2018-03-30 NOTE — Progress Notes (Signed)
Pt's states no medical or surgical changes since previsit or office visit. 

## 2018-03-31 ENCOUNTER — Telehealth: Payer: Self-pay

## 2018-03-31 NOTE — Telephone Encounter (Signed)
  Follow up Call-  Call back number 03/30/2018  Post procedure Call Back phone  # [phone]  Permission to leave phone message Yes  Some recent data might be hidden     Patient questions:  Do you have a fever, pain , or abdominal swelling? No. Pain Score  0 *  Have you tolerated food without any problems? Yes.    Have you been able to return to your normal activities? Yes.    Do you have any questions about your discharge instructions: Diet   No. Medications  No. Follow up visit  No.  Do you have questions or concerns about your Care? No.  Actions: * If pain score is 4 or above: No action needed, pain <4.

## 2018-03-31 NOTE — Telephone Encounter (Signed)
No voicemail set up 

## 2018-04-06 ENCOUNTER — Encounter: Payer: Self-pay | Admitting: Gastroenterology

## 2018-05-09 ENCOUNTER — Other Ambulatory Visit: Payer: Self-pay | Admitting: Cardiovascular Disease

## 2018-05-17 ENCOUNTER — Encounter: Payer: Self-pay | Admitting: Internal Medicine

## 2018-05-17 ENCOUNTER — Other Ambulatory Visit (INDEPENDENT_AMBULATORY_CARE_PROVIDER_SITE_OTHER): Payer: Medicare Other

## 2018-05-17 ENCOUNTER — Ambulatory Visit (INDEPENDENT_AMBULATORY_CARE_PROVIDER_SITE_OTHER): Payer: Medicare Other | Admitting: Internal Medicine

## 2018-05-17 VITALS — BP 138/86 | HR 70 | Temp 97.7°F | Ht 66.0 in | Wt 189.0 lb

## 2018-05-17 DIAGNOSIS — E785 Hyperlipidemia, unspecified: Secondary | ICD-10-CM

## 2018-05-17 DIAGNOSIS — I480 Paroxysmal atrial fibrillation: Secondary | ICD-10-CM

## 2018-05-17 DIAGNOSIS — I10 Essential (primary) hypertension: Secondary | ICD-10-CM

## 2018-05-17 DIAGNOSIS — Z23 Encounter for immunization: Secondary | ICD-10-CM | POA: Diagnosis not present

## 2018-05-17 DIAGNOSIS — R739 Hyperglycemia, unspecified: Secondary | ICD-10-CM | POA: Diagnosis not present

## 2018-05-17 DIAGNOSIS — Z7901 Long term (current) use of anticoagulants: Secondary | ICD-10-CM

## 2018-05-17 LAB — HEPATIC FUNCTION PANEL
ALK PHOS: 96 U/L (ref 39–117)
ALT: 44 U/L (ref 0–53)
AST: 28 U/L (ref 0–37)
Albumin: 4.7 g/dL (ref 3.5–5.2)
BILIRUBIN DIRECT: 0.2 mg/dL (ref 0.0–0.3)
BILIRUBIN TOTAL: 1.1 mg/dL (ref 0.2–1.2)
Total Protein: 7.5 g/dL (ref 6.0–8.3)

## 2018-05-17 LAB — BASIC METABOLIC PANEL
BUN: 20 mg/dL (ref 6–23)
CHLORIDE: 101 meq/L (ref 96–112)
CO2: 33 meq/L — AB (ref 19–32)
Calcium: 9.7 mg/dL (ref 8.4–10.5)
Creatinine, Ser: 1.03 mg/dL (ref 0.40–1.50)
GFR: 75.55 mL/min (ref >60.00)
GLUCOSE: 112 mg/dL — AB (ref 70–99)
POTASSIUM: 3.7 meq/L (ref 3.5–5.1)
Sodium: 141 mEq/L (ref 135–145)

## 2018-05-17 LAB — LIPID PANEL
CHOL/HDL RATIO: 3
Cholesterol: 155 mg/dL (ref 0–200)
HDL: 53.7 mg/dL (ref >39.00)
LDL CALC: 77 mg/dL (ref 0–99)
NONHDL: 101.65
Triglycerides: 122 mg/dL (ref 0.0–149.0)
VLDL: 24.4 mg/dL (ref 0.0–40.0)

## 2018-05-17 LAB — HEMOGLOBIN A1C: Hgb A1c MFr Bld: 6.1 % (ref 4.6–6.5)

## 2018-05-17 MED ORDER — VALSARTAN-HYDROCHLOROTHIAZIDE 160-25 MG PO TABS: 1.0000 | ORAL_TABLET | Freq: Every day | ORAL | 3 refills | Status: DC

## 2018-05-17 NOTE — Assessment & Plan Note (Signed)
Losartan HCT -- d/c (n/a) Diovan-HCT

## 2018-05-17 NOTE — Assessment & Plan Note (Signed)
Eliquis 

## 2018-05-17 NOTE — Progress Notes (Signed)
Subjective:  Patient ID: William [name], male    DOB: 1946-02-10  Age: 72 y.o. MRN: [id number]  CC: No chief complaint on file.   HPI William [name] presents for CAD, dyslipidemia, ED f/u  Outpatient Medications Prior to Visit  Medication Sig Dispense Refill  . atorvastatin (LIPITOR) 40 MG tablet Take 1 tablet (40 mg total) by mouth daily. 90 tablet 3  . Cholecalciferol 1000 UNITS tablet Take 1,000 Units by mouth daily.      Marland Kitchen ELIQUIS 5 MG TABS tablet TAKE 1 TABLET BY MOUTH TWO  TIMES DAILY 180 tablet 1  . potassium chloride SA (K-DUR,KLOR-CON) 20 MEQ tablet TAKE 1 TABLET BY MOUTH  DAILY 90 tablet 3  . vardenafil (LEVITRA) 20 MG tablet Take 0.5-1 tablets (10-20 mg total) by mouth daily as needed. 30 tablet 3  . losartan-hydrochlorothiazide (HYZAAR) 100-25 MG tablet TAKE 1 TABLET BY MOUTH  DAILY 90 tablet 3  . metoprolol tartrate (LOPRESSOR) 50 MG tablet Take 1 tablet (50 mg total) by mouth once for 1 dose. 1 tablet 0   Facility-Administered Medications Prior to Visit  Medication Dose Route Frequency Provider Last Rate Last Dose  . 0.9 %  sodium chloride infusion  500 mL Intravenous Once Nelida Meuse III, MD        ROS: Review of Systems  Constitutional: Negative for appetite change, fatigue and unexpected weight change.  HENT: Negative for congestion, nosebleeds, sneezing, sore throat and trouble swallowing.   Eyes: Negative for itching and visual disturbance.  Respiratory: Negative for cough.   Cardiovascular: Negative for chest pain, palpitations and leg swelling.  Gastrointestinal: Negative for abdominal distention, blood in stool, diarrhea and nausea.  Genitourinary: Negative for frequency and hematuria.  Musculoskeletal: Negative for back pain, gait problem, joint swelling and neck pain.  Skin: Negative for rash.  Neurological: Negative for dizziness, tremors, speech difficulty and weakness.  Psychiatric/Behavioral: Negative for agitation, dysphoric mood and sleep  disturbance. The patient is not nervous/anxious.     Objective:  BP 138/86 (BP Location: Left Arm, Patient Position: Sitting, Cuff Size: Normal)   Pulse 70   Temp 97.7 F (36.5 C) (Oral)   Ht 5\' 6"  (1.676 m)   Wt 189 lb (85.7 kg)   SpO2 95%   BMI 30.51 kg/m   BP Readings from Last 3 Encounters:  05/17/18 138/86  03/30/18 125/78  01/18/18 140/86    Wt Readings from Last 3 Encounters:  05/17/18 189 lb (85.7 kg)  03/30/18 185 lb (83.9 kg)  01/18/18 186 lb (84.4 kg)    Physical Exam  Constitutional: He is oriented to person, place, and time. He appears well-developed. No distress.  NAD  HENT:  Mouth/Throat: Oropharynx is clear and moist.  Eyes: Pupils are equal, round, and reactive to light. Conjunctivae are normal.  Neck: Normal range of motion. No JVD present. No thyromegaly present.  Cardiovascular: Normal rate, regular rhythm, normal heart sounds and intact distal pulses. Exam reveals no gallop and no friction rub.  No murmur heard. Pulmonary/Chest: Effort normal and breath sounds normal. No respiratory distress. He has no wheezes. He has no rales. He exhibits no tenderness.  Abdominal: Soft. Bowel sounds are normal. He exhibits no distension and no mass. There is no tenderness. There is no rebound and no guarding.  Musculoskeletal: Normal range of motion. He exhibits no edema or tenderness.  Lymphadenopathy:    He has no cervical adenopathy.  Neurological: He is alert and oriented to person, place, and time.  He has normal reflexes. No cranial nerve deficit. He exhibits normal muscle tone. He displays a negative Romberg sign. Coordination and gait normal.  Skin: Skin is warm and dry. No rash noted.  Psychiatric: He has a normal mood and affect. His behavior is normal. Judgment and thought content normal.    Lab Results  Component Value Date   WBC 5.8 11/14/2017   HGB 15.4 11/14/2017   HCT 44.6 11/14/2017   PLT 166.0 11/14/2017   GLUCOSE 108 (H) 11/14/2017   CHOL  153 11/14/2017   TRIG 132.0 11/14/2017   HDL 58.60 11/14/2017   LDLCALC 68 11/14/2017   ALT 41 11/14/2017   AST 27 11/14/2017   NA 142 11/14/2017   K 4.2 11/14/2017   CL 100 11/14/2017   CREATININE 1.01 11/14/2017   BUN 20 11/14/2017   CO2 33 (H) 11/14/2017   TSH 2.36 11/14/2017   PSA 2.44 11/14/2017   HGBA1C 6.0 11/14/2017    Ct Coronary Morph W/cta Cor W/score W/ca W/cm &/or Wo/cm  Addendum Date: 10/24/2017   ADDENDUM REPORT: 10/24/2017 17:04 CLINICAL DATA:  14 -year-old male with PAF and abnormal exercise treadmill stress test. EXAM: Cardiac/Coronary  CT TECHNIQUE: The patient was scanned on a [employer]. FINDINGS: A 120 kV prospective scan was triggered in the descending thoracic aorta at 111 HU's. Axial non-contrast 3 mm slices were carried out through the heart. The data set was analyzed on a dedicated work station and scored using the [place]. Gantry rotation speed was 250 msecs and collimation was .6 mm. 5 mg of iv Metoprolol and 0.8 mg of sl NTG was given. The 3D data set was reconstructed in 5% intervals of the 67-82 % of the R-R cycle. Diastolic phases were analyzed on a dedicated work station using MPR, MIP and VRT modes. The patient received 80 cc of contrast. Aorta: Normal size. Mild calcifications in the descending aorta. No dissection. Aortic Valve:  Trileaflet.  No calcifications. Coronary Arteries:  Normal coronary origin.  Right dominance. RCA is a large dominant artery that gives rise to PDA and PLVB. There is minimal calcified plaque with associated stenosis 0-25%. Left main is a large artery that gives rise to LAD and LCX arteries. Distal left main has a mild calcified plaque with stenosis 0-25%. LAD is a large vessel that gives rise to two small diagonal arteries. There is mild mixed diffuse plaque with stenosis 0-25%. 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 LCX is a medium size non-dominant artery. Ostial LCX artery has mild calcified  plaque with associated stenosis 25-50%. Mid LCX artery has mixed plaque with stenosis 50-69%. Other findings: Normal pulmonary vein drainage into the left atrium. Normal let atrial appendage without a thrombus. Normal size of the pulmonary artery. IMPRESSION: 1. Coronary calcium score of 262. This was 12 percentile for age and sex matched control. 2. Normal coronary origin with right dominance. 3. 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 Mid LCX artery has mixed plaque with stenosis 50-69%. Additional analysis with CT FFR will be submitted. 4. 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 Electronically Signed   By: Ena Dawley   On: 10/24/2017 17:04   Result Date: 10/24/2017 EXAM: OVER-READ INTERPRETATION  CT CHEST The following report is an over-read performed by radiologist Dr. Rolm Baptise of [facility], [place] on 10/24/2017. This over-read does not include interpretation of cardiac or coronary anatomy or pathology. The coronary  CTA interpretation by the cardiologist is attached. COMPARISON:  None. FINDINGS: Vascular: Heart is normal size.  Visualized aorta normal caliber. Mediastinum/Nodes: No adenopathy in the lower mediastinum or hila. Lungs/Pleura: Dependent atelectasis in the lower lobes. No effusions. Upper Abdomen: Imaging into the upper abdomen shows no acute findings. Musculoskeletal: Chest wall soft tissues are unremarkable. No acute bony abnormality. IMPRESSION: No acute or significant extracardiac abnormality. Electronically Signed: By: Rolm Baptise M.D. On: 10/24/2017 14:11    Assessment & Plan:   Diagnoses and all orders for this visit:  Need for influenza vaccination -     Flu vaccine HIGH DOSE PF (Fluzone High dose)  Other orders -     valsartan-hydrochlorothiazide (DIOVAN HCT) 160-25 MG tablet; Take 1 tablet by mouth daily.     Meds ordered this encounter  Medications  . valsartan-hydrochlorothiazide (DIOVAN HCT) 160-25 MG  tablet    Sig: Take 1 tablet by mouth daily.    Dispense:  90 tablet    Refill:  3     Follow-up: No follow-ups on file.  Walker Kehr, MD

## 2018-05-17 NOTE — Assessment & Plan Note (Signed)
Lipitor 

## 2018-08-23 DIAGNOSIS — I2510 Atherosclerotic heart disease of native coronary artery without angina pectoris: Secondary | ICD-10-CM | POA: Insufficient documentation

## 2018-08-23 DIAGNOSIS — R9431 Abnormal electrocardiogram [ECG] [EKG]: Secondary | ICD-10-CM | POA: Insufficient documentation

## 2018-08-23 NOTE — Progress Notes (Signed)
Patient ID: William [name], male   DOB: September 12, 1945, 73 y.o.   MRN: [id number]     Cardiology Office Note    Date:  08/29/2018   ID:  William, [name] March 25, 1946, MRN [id number]  PCP:  Cassandria Anger, MD  Cardiologist:   Sanda Klein, MD   No chief complaint on file.   History of Present Illness:  [name] is a 73 y.o. male with previous episodes of paroxysmal atrial fibrillation who returns for routine follow-up.   In February 2019, he had asymptomatic changes on his electrocardiogram (ST changes and QTC prolongation) and his treadmill stress test showed evidence of significant ST segment depression with exercise.  This was followed up with a coronary CT angiogram.  The calcium score was 262 (61st percentile) but the only significant abnormality was a 50-69% stenosis in the mid left circumflex coronary artery.  Incidental note was made of a small distal LAD intramyocardial bridge.  Medical therapy was recommended since he was asymptomatic.  The patient specifically denies any chest pain at rest exertion, dyspnea at rest or with exertion, orthopnea, paroxysmal nocturnal dyspnea, syncope, palpitations, focal neurological deficits, intermittent claudication, lower extremity edema, unexplained weight gain, cough, hemoptysis or wheezing.  Last summer he had a 15-minute episode of palpitations that resolved spontaneously.  He has not had any other clinical episodes of atrial fibrillation in the last year.  He generally feels great.  He continues to go to the gym 2 or 3 days a week, exercising for about an hour each time.  He is having the same schedule: he does a combination of walking/jogging for about 2 miles and then uses the weight machines intermittently.    Labs in November 2019 showed an LDL cholesterol of 77.  All other lipid parameters were within the desirable range.  Treatment with beta blockers in the past led to a symptomatic junctional escape rhythm and he is currently  not on any rate control medications. His blood pressure is well controlled and he takes a statin for hyperlipidemia.  When his dose of statin was reduced for transient abnormality in the liver test, his LDL increased to 126.  He is now back on the full dose of statin.   Past Medical History:  Diagnosis Date  . Atrial fibrillation ([place])   . Colon polyps    Dr Sharlett Iles  . GERD (gastroesophageal reflux disease)   . HTN (hypertension)   . Hyperlipidemia   . OSA on CPAP   . Prostatitis   . Sleep apnea     Past Surgical History:  Procedure Laterality Date  . APPENDECTOMY  2012   09/2010 in [place]  . HERNIA REPAIR  1975  . ROTATOR CUFF REPAIR  2008   left; Dr Para March    Outpatient Medications Prior to Visit  Medication Sig Dispense Refill  . Cholecalciferol 1000 UNITS tablet Take 1,000 Units by mouth daily.      Marland Kitchen ELIQUIS 5 MG TABS tablet TAKE 1 TABLET BY MOUTH TWO  TIMES DAILY 180 tablet 1  . metoprolol tartrate (LOPRESSOR) 50 MG tablet Take 1 tablet (50 mg total) by mouth once for 1 dose. 1 tablet 0  . potassium chloride SA (K-DUR,KLOR-CON) 20 MEQ tablet TAKE 1 TABLET BY MOUTH  DAILY 90 tablet 3  . valsartan-hydrochlorothiazide (DIOVAN HCT) 160-25 MG tablet Take 1 tablet by mouth daily. 90 tablet 3  . vardenafil (LEVITRA) 20 MG tablet Take 0.5-1 tablets (10-20 mg total) by mouth daily as  needed. 30 tablet 3  . atorvastatin (LIPITOR) 40 MG tablet TAKE 1 TABLET BY MOUTH  DAILY 30 tablet 0   Facility-Administered Medications Prior to Visit  Medication Dose Route Frequency Provider Last Rate Last Dose  . 0.9 %  sodium chloride infusion  500 mL Intravenous Once Doran Stabler, MD         Allergies:   Benazepril hcl   Social History   Socioeconomic History  . Marital status: Married    Spouse name: Not on file  . Number of children: 3  . Years of education: Not on file  . Highest education level: Not on file  Occupational History  . Occupation: Accountant/Retired  Social  Needs  . Financial resource strain: Not on file  . Food insecurity:    Worry: Not on file    Inability: Not on file  . Transportation needs:    Medical: Not on file    Non-medical: Not on file  Tobacco Use  . Smoking status: Never Smoker  . Smokeless tobacco: Never Used  Substance and Sexual Activity  . Alcohol use: No  . Drug use: No  . Sexual activity: Yes  Lifestyle  . Physical activity:    Days per week: Not on file    Minutes per session: Not on file  . Stress: Not on file  Relationships  . Social connections:    Talks on phone: Not on file    Gets together: Not on file    Attends religious service: Not on file    Active member of club or organization: Not on file    Attends meetings of clubs or organizations: Not on file    Relationship status: Not on file  Other Topics Concern  . Not on file  Social History Narrative   Regular Exercise - Yes, gym              Family History:  The patient's family history includes Asthma in his son; Coronary artery disease in his mother; Heart disease (age of onset: 87) in his father and mother; Hypertension in an other family member; Stroke in his sister.   ROS:   Please see the history of present illness.    ROS All other systems are reviewed and are negative  PHYSICAL EXAM:   VS:  BP (!) 142/84   Pulse 79   Ht 5\' 6"  (1.676 m)   Wt 193 lb (87.5 kg)   BMI 31.15 kg/m      General: Alert, oriented x3, no distress, appears well Head: no evidence of trauma, PERRL, EOMI, no exophtalmos or lid lag, no myxedema, no xanthelasma; normal ears, nose and oropharynx Neck: normal jugular venous pulsations and no hepatojugular reflux; brisk carotid pulses without delay and no carotid bruits Chest: clear to auscultation, no signs of consolidation by percussion or palpation, normal fremitus, symmetrical and full respiratory excursions Cardiovascular: normal position and quality of the apical impulse, regular rhythm, normal first and  second heart sounds, no murmurs, rubs or gallops Abdomen: no tenderness or distention, no masses by palpation, no abnormal pulsatility or arterial bruits, normal bowel sounds, no hepatosplenomegaly Extremities: no clubbing, cyanosis or edema; 2+ radial, ulnar and brachial pulses bilaterally; 2+ right femoral, posterior tibial and dorsalis pedis pulses; 2+ left femoral, posterior tibial and dorsalis pedis pulses; no subclavian or femoral bruits Neurological: grossly nonfocal Psych: Normal mood and affect   Wt Readings from Last 3 Encounters:  08/29/18 193 lb (87.5 kg)  05/17/18 189  lb (85.7 kg)  03/30/18 185 lb (83.9 kg)      Studies/Labs Reviewed:   EKG:  EKG is  ordered today.  Normal sinus rhythm, minor nonspecific ST-T changes, normal QTC 417 ms  Recent Labs: 11/14/2017: Hemoglobin 15.4; Platelets 166.0; TSH 2.36 05/17/2018: ALT 44; BUN 20; Creatinine, Ser 1.03; Potassium 3.7; Sodium 141   Lipid Panel    Component Value Date/Time   CHOL 155 05/17/2018 0919   TRIG 122.0 05/17/2018 0919   TRIG 67 05/03/2006 0753   HDL 53.70 05/17/2018 0919   CHOLHDL 3 05/17/2018 0919   VLDL 24.4 05/17/2018 0919   LDLCALC 77 05/17/2018 0919     ASSESSMENT:    1. Paroxysmal atrial fibrillation (HCC)   2. Coronary artery disease involving native coronary artery of native heart without angina pectoris   3. Chronic anticoagulation   4. Dyslipidemia   5. Essential hypertension   6. QT prolongation   7. Obstructive sleep apnea      PLAN:  In order of problems listed above:  1. PAF: Only 1 clinically apparent events in the last 12 weeks, minimally symptomatic.  Appropriately anticoagulated. CHADSVasc 2 (age, HTN).  2. Eliquis: Well-tolerated without bleeding problems. 3. CAD: asymptomatic, despite the fact that he comes to the gym regularly.  The focus remains on risk factor modification 4. HLP: no evidence of coronary disease or other structural cardiac abnormalities by previous workup;  target LDL preferably less than 70.  Close to that target on labs performed last November, no changes made to medications but encouraged weight loss. 5. HTN: Fair control.  Typical blood pressure reading at home was 130/80.  No changes made to his medications today. 6. 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  This was not explained last year by electrolyte abnormalities or significant coronary insufficiency.  Still not sure why it happened. 7. OSA: reports compliance with CPAP and good benefit from treatment.  Medication Adjustments/Labs and Tests Ordered: Current medicines are reviewed at length with the patient today.  Concerns regarding medicines are outlined above.  Medication changes, Labs and Tests ordered today are listed in the Patient Instructions below. Patient Instructions  Medication Instructions:  Your physician recommends that you continue on your current medications as directed. Please refer to the Current Medication list given to you today.  If you need a refill on your cardiac medications before your next appointment, please call your pharmacy.    Follow-Up: At [facility], you and your health needs are our priority.  As part of our continuing mission to provide you with exceptional heart care, we have created designated Provider Care Teams.  These Care Teams include your primary Cardiologist (physician) and Advanced Practice Providers (APPs -  Physician Assistants and Nurse Practitioners) who all work together to provide you with the care you need, when you need it. You will need a follow up appointment in 1 year.  Please call our office 2 months in advance to schedule this appointment.  You may see Sanda Klein, MD or one of the following Advanced Practice Providers on your designated Care Team: [place], [place] . Fabian Sharp, PA-C  Any Other Special Instructions Will Be Listed Below (If Applicable). None        Signed, Sanda Klein, MD  08/29/2018 9:27 AM    [facility]  [facility] [address], [place], [place]  [address] Phone: [phone]; Fax: [phone]

## 2018-08-25 ENCOUNTER — Other Ambulatory Visit: Payer: Self-pay | Admitting: Cardiovascular Disease

## 2018-08-29 ENCOUNTER — Encounter: Payer: Self-pay | Admitting: Cardiovascular Disease

## 2018-08-29 ENCOUNTER — Ambulatory Visit (INDEPENDENT_AMBULATORY_CARE_PROVIDER_SITE_OTHER): Payer: Medicare Other | Admitting: Cardiovascular Disease

## 2018-08-29 VITALS — BP 142/84 | HR 79 | Ht 66.0 in | Wt 193.0 lb

## 2018-08-29 DIAGNOSIS — I2510 Atherosclerotic heart disease of native coronary artery without angina pectoris: Secondary | ICD-10-CM | POA: Diagnosis not present

## 2018-08-29 DIAGNOSIS — E785 Hyperlipidemia, unspecified: Secondary | ICD-10-CM

## 2018-08-29 DIAGNOSIS — R9431 Abnormal electrocardiogram [ECG] [EKG]: Secondary | ICD-10-CM

## 2018-08-29 DIAGNOSIS — Z7901 Long term (current) use of anticoagulants: Secondary | ICD-10-CM | POA: Diagnosis not present

## 2018-08-29 DIAGNOSIS — I480 Paroxysmal atrial fibrillation: Secondary | ICD-10-CM

## 2018-08-29 DIAGNOSIS — G4733 Obstructive sleep apnea (adult) (pediatric): Secondary | ICD-10-CM

## 2018-08-29 DIAGNOSIS — I10 Essential (primary) hypertension: Secondary | ICD-10-CM

## 2018-08-29 MED ORDER — ATORVASTATIN CALCIUM 40 MG PO TABS: 40.0000 mg | ORAL_TABLET | Freq: Every day | ORAL | 6 refills | Status: DC

## 2018-08-29 NOTE — Patient Instructions (Signed)
Medication Instructions:  Your physician recommends that you continue on your current medications as directed. Please refer to the Current Medication list given to you today.  If you need a refill on your cardiac medications before your next appointment, please call your pharmacy.   Follow-Up: At [facility], you and your health needs are our priority.  As part of our continuing mission to provide you with exceptional heart care, we have created designated Provider Care Teams.  These Care Teams include your primary Cardiologist (physician) and Advanced Practice Providers (APPs -  Physician Assistants and Nurse Practitioners) who all work together to provide you with the care you need, when you need it. You will need a follow up appointment in 1 year.  Please call our office 2 months in advance to schedule this appointment.  You may see Mihai Croitoru, MD or one of the following Advanced Practice Providers on your designated Care Team: Hao Meng, PA-C . Angela Duke, PA-C  Any Other Special Instructions Will Be Listed Below (If Applicable). None   

## 2018-10-09 ENCOUNTER — Telehealth: Payer: Self-pay

## 2018-10-09 ENCOUNTER — Other Ambulatory Visit: Payer: Self-pay

## 2018-10-09 MED ORDER — ATORVASTATIN CALCIUM 40 MG PO TABS: 40.0000 mg | ORAL_TABLET | Freq: Every day | ORAL | 6 refills | Status: DC

## 2018-10-09 MED ORDER — APIXABAN 5 MG PO TABS: 5.0000 mg | ORAL_TABLET | Freq: Two times a day (BID) | ORAL | 1 refills | Status: DC

## 2018-10-09 NOTE — Telephone Encounter (Signed)
rx sent

## 2018-10-20 ENCOUNTER — Other Ambulatory Visit: Payer: Self-pay

## 2018-10-20 MED ORDER — POTASSIUM CHLORIDE CRYS ER 20 MEQ PO TBCR: 20.0000 meq | EXTENDED_RELEASE_TABLET | Freq: Every day | ORAL | 0 refills | Status: DC

## 2018-11-16 ENCOUNTER — Ambulatory Visit (INDEPENDENT_AMBULATORY_CARE_PROVIDER_SITE_OTHER): Payer: Medicare Other | Admitting: Internal Medicine

## 2018-11-16 ENCOUNTER — Encounter: Payer: Self-pay | Admitting: Internal Medicine

## 2018-11-16 DIAGNOSIS — E785 Hyperlipidemia, unspecified: Secondary | ICD-10-CM

## 2018-11-16 DIAGNOSIS — Z7901 Long term (current) use of anticoagulants: Secondary | ICD-10-CM | POA: Diagnosis not present

## 2018-11-16 DIAGNOSIS — R972 Elevated prostate specific antigen [PSA]: Secondary | ICD-10-CM

## 2018-11-16 DIAGNOSIS — I10 Essential (primary) hypertension: Secondary | ICD-10-CM

## 2018-11-16 DIAGNOSIS — Z0000 Encounter for general adult medical examination without abnormal findings: Secondary | ICD-10-CM

## 2018-11-16 NOTE — Assessment & Plan Note (Signed)
[facility] 

## 2018-11-16 NOTE — Assessment & Plan Note (Signed)
On Eliquis

## 2018-11-16 NOTE — Progress Notes (Signed)
Virtual Visit via Video Note  I connected with William Millet on 11/16/18 at  8:30 AM EDT by a video enabled telemedicine application and verified that I am speaking with the correct person using two identifiers.   I discussed the limitations of evaluation and management by telemedicine and the availability of in person appointments. The patient expressed understanding and agreed to proceed.  History of Present Illness: We need to follow-up on hypertension, dyslipidemia, anticoagulation.  Baby had a spider bite treated with an antibiotic and steroid cream  There has been no runny nose, cough, chest pain, shortness of breath, abdominal pain, diarrhea, constipation, arthralgias, skin rashes.   Observations/Objective: The patient appears to be in no acute distress, looks well.  Assessment and Plan:  See my Assessment and Plan. Follow Up Instructions:    I discussed the assessment and treatment plan with the patient. The patient was provided an opportunity to ask questions and all were answered. The patient agreed with the plan and demonstrated an understanding of the instructions.   The patient was advised to call back or seek an in-person evaluation if the symptoms worsen or if the condition fails to improve as anticipated.  I provided face-to-face time during this encounter. We were at different locations.   Walker Kehr, MD

## 2018-11-16 NOTE — Assessment & Plan Note (Signed)
Lipitor Labs 

## 2018-11-16 NOTE — Assessment & Plan Note (Signed)
PSA

## 2018-12-27 ENCOUNTER — Telehealth: Payer: Self-pay | Admitting: Gastroenterology

## 2018-12-27 NOTE — Telephone Encounter (Signed)
Pt states he started having problems with hemorrhoids last Thursday. Reports he was uncomfortable and there was quite a bit of swelling. He used prep h and the swelling improved but he has had bleeding the whole time. Pt takes Eliquis 5mg  BID. States the bleeding is all the time, not a lot but enough to mess up his clothes and worry him. The bleeding did not improve using the prep h. Pt wants to know what he needs to do, please advise.

## 2018-12-27 NOTE — Telephone Encounter (Signed)
Pt is concerned about his bleeding hemorrhoid.  Pt is on a blood thinner.  Please advise.

## 2018-12-28 ENCOUNTER — Encounter: Payer: Self-pay | Admitting: Gastroenterology

## 2018-12-28 ENCOUNTER — Ambulatory Visit (INDEPENDENT_AMBULATORY_CARE_PROVIDER_SITE_OTHER): Payer: Medicare Other | Admitting: Gastroenterology

## 2018-12-28 ENCOUNTER — Telehealth: Payer: Self-pay

## 2018-12-28 VITALS — BP 138/84 | HR 83 | Temp 98.1°F | Ht 66.0 in | Wt 191.0 lb

## 2018-12-28 DIAGNOSIS — K644 Residual hemorrhoidal skin tags: Secondary | ICD-10-CM

## 2018-12-28 DIAGNOSIS — K625 Hemorrhage of anus and rectum: Secondary | ICD-10-CM

## 2018-12-28 DIAGNOSIS — K641 Second degree hemorrhoids: Secondary | ICD-10-CM

## 2018-12-28 MED ORDER — HYDROCORTISONE ACETATE 25 MG RE SUPP: 25.0000 mg | Freq: Every day | RECTAL | 0 refills | Status: DC

## 2018-12-28 NOTE — Patient Instructions (Signed)
Take Sitz baths four times a day for the next 3-5 days  Use Benefiber 1 tbs three times a day  We will send Anusol suppositories to your pharmacy  Continue Eliquis   Follow up with Dr Loletha Carrow in 2 weeks   How to Take a Sitz Bath A sitz bath is a warm water bath that may be used to care for your rectum, genital area, or the area between your rectum and genitals (perineum). For a sitz bath, the water only comes up to your hips and covers your buttocks. A sitz bath may done at home in a bathtub or with a portable sitz bath that fits over the toilet. Your health care provider may recommend a sitz bath to help:  Relieve pain and discomfort after delivering a baby.  Relieve pain and itching from hemorrhoids or anal fissures.  Relieve pain after certain surgeries.  Relax muscles that are sore or tight. How to take a sitz bath Take 3-4 sitz baths a day, or as many as told by your health care provider. Bathtub sitz bath To take a sitz bath in a bathtub: 1. Partially fill a bathtub with warm water. The water should be deep enough to cover your hips and buttocks when you are sitting in the tub. 2. If your health care provider told you to put medicine in the water, follow his or her instructions. 3. Sit in the water. 4. Open the tub drain a little, and leave it open during your bath. 5. Turn on the warm water again, enough to replace the water that is draining out. Keep the water running throughout your bath. This helps keep the water at the right level and the right temperature. 6. Soak in the water for 15-20 minutes, or as long as told by your health care provider. 7. When you are done, be careful when you stand up. You may feel dizzy. 8. After the sitz bath, pat yourself dry. Do not rub your skin to dry it.  Over-the-toilet sitz bath To take a sitz bath with an over-the-toilet basin: 1. Follow the manufacturer's instructions. 2. Fill the basin with warm water. 3. If your health care  provider told you to put medicine in the water, follow his or her instructions. 4. Sit on the seat. Make sure the water covers your buttocks and perineum. 5. Soak in the water for 15-20 minutes, or as long as told by your health care provider. 6. After the sitz bath, pat yourself dry. Do not rub your skin to dry it. 7. Clean and dry the basin between uses. 8. Discard the basin if it cracks, or according to the manufacturer's instructions. Contact a health care provider if:  Your symptoms get worse. Do not continue with sitz baths if your symptoms get worse.  You have new symptoms. If this happens, do not continue with sitz baths until you talk with your health care provider. Summary  A sitz bath is a warm water bath in which the water only comes up to your hips and covers your buttocks.  A sitz bath may help relieve itching, relieve pain, and relax muscles that are sore or tight in the lower part of your body, including your genital area.  Take 3-4 sitz baths a day, or as many as told by your health care provider. Soak in the water for 15-20 minutes.  Do not continue with sitz baths if your symptoms get worse. This information is not intended to replace advice given to  you by your health care provider. Make sure you discuss any questions you have with your health care provider. Document Released: 03/06/2004 Document Revised: 06/16/2017 Document Reviewed: 06/16/2017 Elsevier Patient Education  [place].     I appreciate the  opportunity to care for you  Thank You   Harl Bowie , MD

## 2018-12-28 NOTE — Telephone Encounter (Signed)
Ok, I have to do a procedure at [facility] hospital this afternoon, please try to get him in this morning. thanks

## 2018-12-28 NOTE — Telephone Encounter (Signed)
I would like him to be seen in office if possible.  However, I am out of the office this week while on hospital service, returning Wed, July 8th.  If there is an APP or physician office appointment today or next Monday, please arrange that.  With a gloved finger, he can try to gently push the swollen and prolapsed hemorrhoid tissue back in.  Proceed to ED is bleeding becomes continuous.

## 2018-12-28 NOTE — Progress Notes (Signed)
William [name]    [id number]    [birth year]  Primary Care Physician:Plotnikov, Evie Lacks, MD  Referring Physician: Cassandria Anger, MD [place],  [place] [address]   Chief complaint: BRBPR, hemorrhoids  HPI: 73 year old male with HTN, CAD, afib on Eliquis, hyperlipidemia with c/o rectal bleeding and symptomatic hemorrhoids. He felt something pop Saturday night and had significant bleeding since then, worse after a bowel movement.  Bleeding has decreased since Tuesday.  He held Eliquis for 2 days earlier this week and has since restarted it.  He felt swelling and pressure, almost felt like a "balloon prior to bursting and bleeding".  He has mild discomfort but denies any sharp pain.  No fever or chills.  Colonoscopy March 30, 2018 removal of 5 sessile polyps [3 tubular adenomas and 2 hyperplastic polyps], small hemorrhoids otherwise normal exam  Outpatient Encounter Medications as of 12/28/2018  Medication Sig  . apixaban (ELIQUIS) 5 MG TABS tablet Take 1 tablet (5 mg total) by mouth 2 (two) times daily.  Marland Kitchen atorvastatin (LIPITOR) 40 MG tablet Take 1 tablet (40 mg total) by mouth daily.  . potassium chloride SA (K-DUR) 20 MEQ tablet Take 1 tablet (20 mEq total) by mouth daily.  . valsartan-hydrochlorothiazide (DIOVAN HCT) 160-25 MG tablet Take 1 tablet by mouth daily.  . vardenafil (LEVITRA) 20 MG tablet Take 0.5-1 tablets (10-20 mg total) by mouth daily as needed.  . [DISCONTINUED] Cholecalciferol 1000 UNITS tablet Take 1,000 Units by mouth daily.    . [DISCONTINUED] metoprolol tartrate (LOPRESSOR) 50 MG tablet Take 1 tablet (50 mg total) by mouth once for 1 dose.   Facility-Administered Encounter Medications as of 12/28/2018  Medication  . 0.9 %  sodium chloride infusion    Allergies as of 12/28/2018 - Review Complete 12/28/2018  Allergen Reaction Noted  . Benazepril hcl Other (See Comments) 05/23/2007    Past Medical History:  Diagnosis Date  .  Atrial fibrillation ([place])   . Colon polyps    Dr Sharlett Iles  . GERD (gastroesophageal reflux disease)   . HTN (hypertension)   . Hyperlipidemia   . OSA on CPAP   . Prostatitis   . Sleep apnea     Past Surgical History:  Procedure Laterality Date  . APPENDECTOMY  2012   09/2010 in [place]  . HERNIA REPAIR  1975  . ROTATOR CUFF REPAIR  2008   left; Dr Para March    Family History  Problem Relation Age of Onset  . Coronary artery disease Mother   . Heart disease Mother 54       MI  . Hypertension Other   . Heart disease Father 32       MI  . Stroke Sister        Cranial anuerysm  . Asthma Son   . Colon cancer Neg Hx   . Stomach cancer Neg Hx   . Rectal cancer Neg Hx   . Esophageal cancer Neg Hx     Social History   Socioeconomic History  . Marital status: Married    Spouse name: Not on file  . Number of children: 3  . Years of education: Not on file  . Highest education level: Not on file  Occupational History  . Occupation: Accountant/Retired  Social Needs  . Financial resource strain: Not on file  . Food insecurity    Worry: Not on file    Inability: Not on file  .  Transportation needs    Medical: Not on file    Non-medical: Not on file  Tobacco Use  . Smoking status: Never Smoker  . Smokeless tobacco: Never Used  Substance and Sexual Activity  . Alcohol use: No  . Drug use: No  . Sexual activity: Yes  Lifestyle  . Physical activity    Days per week: Not on file    Minutes per session: Not on file  . Stress: Not on file  Relationships  . Social Herbalist on phone: Not on file    Gets together: Not on file    Attends religious service: Not on file    Active member of club or organization: Not on file    Attends meetings of clubs or organizations: Not on file    Relationship status: Not on file  . Intimate partner violence    Fear of current or ex partner: Not on file    Emotionally abused: Not on file    Physically abused: Not on file     Forced sexual activity: Not on file  Other Topics Concern  . Not on file  Social History Narrative   Regular Exercise - Yes, gym               Review of systems: Review of Systems  Constitutional: Negative for fever and chills.  HENT: Negative.   Eyes: Negative for blurred vision.  Respiratory: Negative for cough, shortness of breath and wheezing.   Cardiovascular: Negative for chest pain and palpitations.  Gastrointestinal: as per HPI Genitourinary: Negative for dysuria, urgency, frequency and hematuria.  Musculoskeletal: Negative for myalgias, back pain and joint pain.  Skin: Negative for itching and rash.  Neurological: Negative for dizziness, tremors, focal weakness, seizures and loss of consciousness.  Endo/Heme/Allergies: Negative for seasonal allergies.  Psychiatric/Behavioral: Negative for depression, suicidal ideas and hallucinations.  All other systems reviewed and are negative.   Physical Exam: Vitals:   12/28/18 1101  BP: 138/84  Pulse: 83  Temp: 98.1 F (36.7 C)  SpO2: 93%   Body mass index is 30.83 kg/m. Gen:      No acute distress HEENT:  EOMI, sclera anicteric Neck:     No masses; no thyromegaly Lungs:    Clear to auscultation bilaterally; normal respiratory effort CV:         Regular rate and rhythm; no murmurs Abd:      + bowel sounds; soft, non-tender; no palpable masses, no distension Ext:    No edema; adequate peripheral perfusion Skin:      Warm and dry; no rash Neuro: alert and oriented x 3 Psych: normal mood and affect Rectal exam: External hemorrhoid right posterior, engorged with adherent heme , mild tenderness +, no anal fissure  Anoscopy: Grade 2 right anterior and right posterior internal hemorrhoids, no active bleeding, normal dentate line, no visible nodules  Data Reviewed:  Reviewed labs, radiology imaging, old records and pertinent past GI work up   Assessment and Plan/Recommendations: 73 year old male with history of  hypertension, hyperlipidemia, OSA, A. fib on chronic anticoagulation with Eliquis with bright red blood per rectum and symptomatic hemorrhoids Rectal bleeding from external hemorrhoids Advised patient to use Anusol suppositories daily at bedtime for next 5 to 7 days Sitz bath 4 times daily Benefiber 1 tablespoon 3 times daily Continue Eliquis Follow-up in 2 weeks   25 minutes was spent face-to-face with the patient. Greater than 50% of the time used for counseling as well as  treatment plan and follow-up. He had multiple questions which were answered to his satisfaction  K. Denzil Magnuson , MD    CC: Plotnikov, Evie Lacks, MD

## 2018-12-28 NOTE — Telephone Encounter (Signed)
Someone cancelled on your schedule so I scheduled this pt at 11am. See notes below.

## 2018-12-28 NOTE — Telephone Encounter (Signed)
Covid-19 screening questions   Do you now or have you had a fever in the last 14 days? No  Do you have any respiratory symptoms of shortness of breath or cough now or in the last 14 days? No  Do you have any family members or close contacts with diagnosed or suspected Covid-19 in the past 14 days? No  Have you been tested for Covid-19 and found to be positive? No        

## 2018-12-28 NOTE — Telephone Encounter (Signed)
Okay; thanks.

## 2018-12-28 NOTE — Telephone Encounter (Signed)
Dr. Silverio Decamp please see note below. Can this pt be added to your schedule today? Please advise.

## 2019-01-02 ENCOUNTER — Encounter: Payer: Self-pay | Admitting: Gastroenterology

## 2019-01-22 ENCOUNTER — Ambulatory Visit: Payer: Medicare Other | Admitting: Gastroenterology

## 2019-03-13 ENCOUNTER — Telehealth: Payer: Self-pay | Admitting: Internal Medicine

## 2019-03-13 NOTE — Telephone Encounter (Signed)
Patient declined AWV. SF °

## 2019-03-23 ENCOUNTER — Other Ambulatory Visit: Payer: Self-pay | Admitting: Cardiovascular Disease

## 2019-04-07 ENCOUNTER — Other Ambulatory Visit: Payer: Self-pay | Admitting: Internal Medicine

## 2019-04-24 ENCOUNTER — Encounter: Payer: Self-pay | Admitting: Internal Medicine

## 2019-04-24 ENCOUNTER — Other Ambulatory Visit: Payer: Self-pay

## 2019-04-24 ENCOUNTER — Ambulatory Visit (INDEPENDENT_AMBULATORY_CARE_PROVIDER_SITE_OTHER): Payer: Medicare Other | Admitting: Internal Medicine

## 2019-04-24 ENCOUNTER — Other Ambulatory Visit (INDEPENDENT_AMBULATORY_CARE_PROVIDER_SITE_OTHER): Payer: Medicare Other

## 2019-04-24 DIAGNOSIS — R739 Hyperglycemia, unspecified: Secondary | ICD-10-CM | POA: Diagnosis not present

## 2019-04-24 DIAGNOSIS — R972 Elevated prostate specific antigen [PSA]: Secondary | ICD-10-CM

## 2019-04-24 DIAGNOSIS — Z125 Encounter for screening for malignant neoplasm of prostate: Secondary | ICD-10-CM

## 2019-04-24 DIAGNOSIS — E785 Hyperlipidemia, unspecified: Secondary | ICD-10-CM

## 2019-04-24 DIAGNOSIS — Z7901 Long term (current) use of anticoagulants: Secondary | ICD-10-CM

## 2019-04-24 DIAGNOSIS — I10 Essential (primary) hypertension: Secondary | ICD-10-CM

## 2019-04-24 DIAGNOSIS — I480 Paroxysmal atrial fibrillation: Secondary | ICD-10-CM | POA: Diagnosis not present

## 2019-04-24 DIAGNOSIS — Z0000 Encounter for general adult medical examination without abnormal findings: Secondary | ICD-10-CM | POA: Diagnosis not present

## 2019-04-24 LAB — URINALYSIS, ROUTINE W REFLEX MICROSCOPIC
Bilirubin Urine: NEGATIVE
Hgb urine dipstick: NEGATIVE
Ketones, ur: NEGATIVE
Nitrite: NEGATIVE
Specific Gravity, Urine: 1.015 (ref 1.000–1.030)
Total Protein, Urine: NEGATIVE
Urine Glucose: NEGATIVE
Urobilinogen, UA: 0.2 (ref 0.0–1.0)
pH: 6.5 (ref 5.0–8.0)

## 2019-04-24 LAB — LIPID PANEL
Cholesterol: 172 mg/dL (ref 0–200)
HDL: 50.1 mg/dL (ref >39.00)
LDL Cholesterol: 92 mg/dL (ref 0–99)
NonHDL: 122.35
Total CHOL/HDL Ratio: 3
Triglycerides: 152 mg/dL — ABNORMAL HIGH (ref 0.0–149.0)
VLDL: 30.4 mg/dL (ref 0.0–40.0)

## 2019-04-24 LAB — CBC WITH DIFFERENTIAL/PLATELET
Basophils Absolute: 0 10*3/uL (ref 0.0–0.1)
Basophils Relative: 0.8 % (ref 0.0–3.0)
Eosinophils Absolute: 0.1 10*3/uL (ref 0.0–0.7)
Eosinophils Relative: 1.8 % (ref 0.0–5.0)
HCT: 44.6 % (ref 39.0–52.0)
Hemoglobin: 15.4 g/dL (ref 13.0–17.0)
Lymphocytes Relative: 24.7 % (ref 12.0–46.0)
Lymphs Abs: 1.3 10*3/uL (ref 0.7–4.0)
MCHC: 34.5 g/dL (ref 30.0–36.0)
MCV: 89.2 fl (ref 78.0–100.0)
Monocytes Absolute: 0.3 10*3/uL (ref 0.1–1.0)
Monocytes Relative: 6.2 % (ref 3.0–12.0)
Neutro Abs: 3.4 10*3/uL (ref 1.4–7.7)
Neutrophils Relative %: 66.5 % (ref 43.0–77.0)
Platelets: 168 10*3/uL (ref 150.0–400.0)
RBC: 5.01 Mil/uL (ref 4.22–5.81)
RDW: 13.8 % (ref 11.5–15.5)
WBC: 5.1 10*3/uL (ref 4.0–10.5)

## 2019-04-24 LAB — BASIC METABOLIC PANEL
BUN: 17 mg/dL (ref 6–23)
CO2: 34 mEq/L — ABNORMAL HIGH (ref 19–32)
Calcium: 9.5 mg/dL (ref 8.4–10.5)
Chloride: 100 mEq/L (ref 96–112)
Creatinine, Ser: 0.96 mg/dL (ref 0.40–1.50)
GFR: 76.89 mL/min (ref >60.00)
Glucose, Bld: 115 mg/dL — ABNORMAL HIGH (ref 70–99)
Potassium: 3.8 mEq/L (ref 3.5–5.1)
Sodium: 142 mEq/L (ref 135–145)

## 2019-04-24 LAB — HEPATIC FUNCTION PANEL
ALT: 55 U/L — ABNORMAL HIGH (ref 0–53)
AST: 35 U/L (ref 0–37)
Albumin: 4.7 g/dL (ref 3.5–5.2)
Alkaline Phosphatase: 103 U/L (ref 39–117)
Bilirubin, Direct: 0.2 mg/dL (ref 0.0–0.3)
Total Bilirubin: 1.2 mg/dL (ref 0.2–1.2)
Total Protein: 7.1 g/dL (ref 6.0–8.3)

## 2019-04-24 LAB — HEMOGLOBIN A1C: Hgb A1c MFr Bld: 6.3 % (ref 4.6–6.5)

## 2019-04-24 MED ORDER — VITAMIN D3 50 MCG (2000 UT) PO CAPS: 2000.0000 [IU] | ORAL_CAPSULE | Freq: Every day | ORAL | 3 refills | Status: AC

## 2019-04-24 MED ORDER — ATORVASTATIN CALCIUM 40 MG PO TABS: 40.0000 mg | ORAL_TABLET | Freq: Every day | ORAL | 3 refills | Status: DC

## 2019-04-24 NOTE — Assessment & Plan Note (Signed)
Dr Sallyanne Kuster On Eliquis

## 2019-04-24 NOTE — Assessment & Plan Note (Signed)
We discussed age appropriate health related issues, including available/recomended screening tests and vaccinations. We discussed a need for adhering to healthy diet and exercise. Labs were ordered to be later reviewed . All questions were answered.   

## 2019-04-24 NOTE — Patient Instructions (Addendum)

## 2019-04-24 NOTE — Progress Notes (Signed)
Subjective:  Patient ID: William [name], male    DOB: 03/13/1946  Age: 73 y.o. MRN: [id number]  CC: No chief complaint on file.   HPI BOHUMIL KERKMAN presents for a well exam F/u A fib C/o wt gain  Outpatient Medications Prior to Visit  Medication Sig Dispense Refill  . atorvastatin (LIPITOR) 40 MG tablet Take 1 tablet (40 mg total) by mouth daily. 30 tablet 6  . ELIQUIS 5 MG TABS tablet TAKE 1 TABLET BY MOUTH  TWICE DAILY 180 tablet 3  . hydrocortisone (ANUSOL-HC) 25 MG suppository Place 1 suppository (25 mg total) rectally at bedtime. For 5-7 days then use as needed 24 suppository 0  . potassium chloride SA (K-DUR) 20 MEQ tablet Take 1 tablet (20 mEq total) by mouth daily. 90 tablet 0  . valsartan-hydrochlorothiazide (DIOVAN-HCT) 160-25 MG tablet TAKE 1 TABLET BY MOUTH  DAILY 90 tablet 3  . vardenafil (LEVITRA) 20 MG tablet Take 0.5-1 tablets (10-20 mg total) by mouth daily as needed. 30 tablet 3   Facility-Administered Medications Prior to Visit  Medication Dose Route Frequency Provider Last Rate Last Dose  . 0.9 %  sodium chloride infusion  500 mL Intravenous Once Nelida Meuse III, MD        ROS: Review of Systems  Constitutional: Negative for appetite change, fatigue and unexpected weight change.  HENT: Negative for congestion, nosebleeds, sneezing, sore throat and trouble swallowing.   Eyes: Negative for itching and visual disturbance.  Respiratory: Negative for cough.   Cardiovascular: Negative for chest pain, palpitations and leg swelling.  Gastrointestinal: Negative for abdominal distention, blood in stool, diarrhea and nausea.  Genitourinary: Negative for frequency and hematuria.  Musculoskeletal: Negative for back pain, gait problem, joint swelling and neck pain.  Skin: Negative for rash.  Neurological: Negative for dizziness, tremors, speech difficulty and weakness.  Psychiatric/Behavioral: Negative for agitation, dysphoric mood, sleep disturbance and suicidal  ideas. The patient is not nervous/anxious.     Objective:  BP 134/76 (BP Location: Left Arm, Patient Position: Sitting, Cuff Size: Large)   Pulse 85   Temp (!) 97.5 F (36.4 C) (Oral)   Ht 5\' 6"  (1.676 m)   Wt 190 lb (86.2 kg)   SpO2 91%   BMI 30.67 kg/m   BP Readings from Last 3 Encounters:  04/24/19 134/76  12/28/18 138/84  08/29/18 (!) 142/84    Wt Readings from Last 3 Encounters:  04/24/19 190 lb (86.2 kg)  12/28/18 191 lb (86.6 kg)  08/29/18 193 lb (87.5 kg)    Physical Exam Constitutional:      General: He is not in acute distress.    Appearance: He is well-developed.     Comments: NAD  Eyes:     Conjunctiva/sclera: Conjunctivae normal.     Pupils: Pupils are equal, round, and reactive to light.  Neck:     Musculoskeletal: Normal range of motion.     Thyroid: No thyromegaly.     Vascular: No JVD.  Cardiovascular:     Rate and Rhythm: Normal rate and regular rhythm.     Heart sounds: Normal heart sounds. No murmur. No friction rub. No gallop.   Pulmonary:     Effort: Pulmonary effort is normal. No respiratory distress.     Breath sounds: Normal breath sounds. No wheezing or rales.  Chest:     Chest wall: No tenderness.  Abdominal:     General: Bowel sounds are normal. There is no distension.  Palpations: Abdomen is soft. There is no mass.     Tenderness: There is no abdominal tenderness. There is no guarding or rebound.  Genitourinary:    Rectum: Normal. Guaiac result negative.  Musculoskeletal: Normal range of motion.        General: No tenderness.  Lymphadenopathy:     Cervical: No cervical adenopathy.  Skin:    General: Skin is warm and dry.     Findings: No rash.  Neurological:     Mental Status: He is alert and oriented to person, place, and time.     Cranial Nerves: No cranial nerve deficit.     Motor: No abnormal muscle tone.     Coordination: Coordination normal.     Gait: Gait normal.     Deep Tendon Reflexes: Reflexes are normal and  symmetric.  Psychiatric:        Behavior: Behavior normal.        Thought Content: Thought content normal.        Judgment: Judgment normal.    Prostate 1+  Lab Results  Component Value Date   WBC 5.8 11/14/2017   HGB 15.4 11/14/2017   HCT 44.6 11/14/2017   PLT 166.0 11/14/2017   GLUCOSE 112 (H) 05/17/2018   CHOL 155 05/17/2018   TRIG 122.0 05/17/2018   HDL 53.70 05/17/2018   LDLCALC 77 05/17/2018   ALT 44 05/17/2018   AST 28 05/17/2018   NA 141 05/17/2018   K 3.7 05/17/2018   CL 101 05/17/2018   CREATININE 1.03 05/17/2018   BUN 20 05/17/2018   CO2 33 (H) 05/17/2018   TSH 2.36 11/14/2017   PSA 2.44 11/14/2017   HGBA1C 6.1 05/17/2018    Ct Coronary Morph W/cta Cor W/score W/ca W/cm &/or Wo/cm  Addendum Date: 10/24/2017   ADDENDUM REPORT: 10/24/2017 17:04 CLINICAL DATA:  74 -year-old male with PAF and abnormal exercise treadmill stress test. EXAM: Cardiac/Coronary  CT TECHNIQUE: The patient was scanned on a [employer]. FINDINGS: A 120 kV prospective scan was triggered in the descending thoracic aorta at 111 HU's. Axial non-contrast 3 mm slices were carried out through the heart. The data set was analyzed on a dedicated work station and scored using the [place]. Gantry rotation speed was 250 msecs and collimation was .6 mm. 5 mg of iv Metoprolol and 0.8 mg of sl NTG was given. The 3D data set was reconstructed in 5% intervals of the 67-82 % of the R-R cycle. Diastolic phases were analyzed on a dedicated work station using MPR, MIP and VRT modes. The patient received 80 cc of contrast. Aorta: Normal size. Mild calcifications in the descending aorta. No dissection. Aortic Valve:  Trileaflet.  No calcifications. Coronary Arteries:  Normal coronary origin.  Right dominance. RCA is a large dominant artery that gives rise to PDA and PLVB. There is minimal calcified plaque with associated stenosis 0-25%. Left main is a large artery that gives rise to LAD and LCX arteries.  Distal left main has a mild calcified plaque with stenosis 0-25%. LAD is a large vessel that gives rise to two small diagonal arteries. There is mild mixed diffuse plaque with stenosis 0-25%. 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 LCX is a medium size non-dominant artery. Ostial LCX artery has mild calcified plaque with associated stenosis 25-50%. Mid LCX artery has mixed plaque with stenosis 50-69%. Other findings: Normal pulmonary vein drainage into the left atrium. Normal let atrial appendage without a thrombus. Normal size  of the pulmonary artery. IMPRESSION: 1. Coronary calcium score of 262. This was 75 percentile for age and sex matched control. 2. Normal coronary origin with right dominance. 3. 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 Mid LCX artery has mixed plaque with stenosis 50-69%. Additional analysis with CT FFR will be submitted. 4. 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 Electronically Signed   By: Ena Dawley   On: 10/24/2017 17:04   Result Date: 10/24/2017 EXAM: OVER-READ INTERPRETATION  CT CHEST The following report is an over-read performed by radiologist Dr. Rolm Baptise of [facility], [place] on 10/24/2017. This over-read does not include interpretation of cardiac or coronary anatomy or pathology. The coronary CTA interpretation by the cardiologist is attached. COMPARISON:  None. FINDINGS: Vascular: Heart is normal size.  Visualized aorta normal caliber. Mediastinum/Nodes: No adenopathy in the lower mediastinum or hila. Lungs/Pleura: Dependent atelectasis in the lower lobes. No effusions. Upper Abdomen: Imaging into the upper abdomen shows no acute findings. Musculoskeletal: Chest wall soft tissues are unremarkable. No acute bony abnormality. IMPRESSION: No acute or significant extracardiac abnormality. Electronically Signed: By: Rolm Baptise M.D. On: 10/24/2017 14:11    Assessment & Plan:   There are no  diagnoses linked to this encounter.   No orders of the defined types were placed in this encounter.    Follow-up: No follow-ups on file.  Walker Kehr, MD

## 2019-04-24 NOTE — Assessment & Plan Note (Signed)
Lipitor 

## 2019-04-26 LAB — TSH: TSH: 2.1 u[IU]/mL (ref 0.35–4.50)

## 2019-04-26 LAB — PSA: PSA: 2.37 ng/mL (ref 0.10–4.00)

## 2019-06-19 ENCOUNTER — Other Ambulatory Visit: Payer: Self-pay | Admitting: Internal Medicine

## 2019-07-12 ENCOUNTER — Telehealth: Payer: Self-pay

## 2019-07-12 ENCOUNTER — Other Ambulatory Visit: Payer: Self-pay

## 2019-07-12 ENCOUNTER — Ambulatory Visit (INDEPENDENT_AMBULATORY_CARE_PROVIDER_SITE_OTHER): Payer: Medicare Other | Admitting: Internal Medicine

## 2019-07-12 ENCOUNTER — Encounter: Payer: Self-pay | Admitting: Internal Medicine

## 2019-07-12 VITALS — BP 140/82 | HR 83 | Temp 97.5°F | Resp 16 | Ht 66.0 in | Wt 194.4 lb

## 2019-07-12 DIAGNOSIS — N472 Paraphimosis: Secondary | ICD-10-CM | POA: Insufficient documentation

## 2019-07-12 DIAGNOSIS — N481 Balanitis: Secondary | ICD-10-CM | POA: Insufficient documentation

## 2019-07-12 NOTE — Patient Instructions (Signed)
Paraphimosis Paraphimosis is a serious condition that happens when the fold of skin that stretches over the tip of the penis (foreskin) becomes stuck when it is pulled back. This condition blocks blood from flowing away from the penis tip, and that causes swelling that gets worse and worse. Paraphimosis needs to be treated right away. What are the causes? This condition may be caused by:  Leaving the foreskin pulled back after a procedure, such as after the placement of a catheter.  A foreskin that is tighter than normal.  A foreskin that has been pulled back for too long.  A forceful pulling back of the foreskin.  Infection under the foreskin.  Trauma to the area, such as a hard hit to the tip of the penis.  Having sex or masturbating.  Hair or clothing that gets wrapped around the penis. What increases the risk? You are more likely to develop this condition if you:  Have not had all of your foreskin removed.  Have had frequent urological procedures.  Have poor hygiene.  Need help with daily hygiene from a caregiver. What are the signs or symptoms? Symptoms of this condition include:  A foreskin that cannot be returned to its normal position.  Pain, often at the tip of the penis.  Swelling of the penis.  Trouble urinating.  Skin that is red or bluish at the tip of the penis. How is this diagnosed? This condition may be diagnosed based on your symptoms and a physical exam. How is this treated? This condition may be treated with:  A procedure in which your health care provider manually moves the foreskin back into position over the tip of the penis. Swelling may first be reduced by: ? Applying ice to the area. ? Wrapping a bandage tightly around the penis. ? Using needles to drain any pus or blood that is causing the swelling. ? Applying a gauze soaked with certain medicines or solutions. ? Applying granulated sugar to the area. ? Injecting medicine into the  foreskin to reduce swelling.  Medicine to relieve pain. Medicine may be given by mouth, through an IV, or by an injection to the base of the penis (nerve block).  A procedure in which a small incision is made in the tightened foreskin to free it and allow it to be pulled back into place (dorsal slit procedure).  Surgery to remove the foreskin (circumcision). This may be done if the foreskin cannot be moved back into place. Most of the time, treatment can be done in a clinic or a health care provider's office. Follow these instructions at home: Lifestyle  Follow instructions from your health care provider about avoiding sexual activity.  Wear loose undergarments to avoid applying pressure to the area. General instructions  Take over-the-counter and prescription medicines only as told by your health care provider.  Apply any creams to the affected area only as told by your health care provider.  Follow instructions from your health care provider about how to take care of your wound. Make sure you: ? Wash your hands with soap and water before and after you change your bandage (dressing) if you were given one. If soap and water are not available, use hand sanitizer. ? Ask when you should remove your dressing. ? Ask whether the area can get wet.  Keep all follow-up visits as told by your health care provider. This is important. Contact a health care provider if:  The treated area of skin does not heal or  it becomes more irritated, red, or bloody.  Urination is difficult or painful.  Pain in the penis continues, even after you take medicine for pain.  You develop a fever. Summary  Paraphimosis is a serious condition that happens when the fold of skin that stretches over the tip of the penis (foreskin) becomes stuck when it is pulled back. Paraphimosis needs to be treated right away.  Take over-the-counter and prescription medicines only as told by your health care provider.  Apply  any creams to the affected area only as told by your health care provider.  Contact a health care provider if urination is difficult or painful, or if pain in the penis continues even after you take medicine for pain.  Keep all follow-up visits as told by your health care provider. This is important. This information is not intended to replace advice given to you by your health care provider. Make sure you discuss any questions you have with your health care provider. Document Revised: 11/23/2017 Document Reviewed: 11/23/2017 Elsevier Patient Education  [place].

## 2019-07-12 NOTE — Progress Notes (Signed)
Subjective:  Patient ID: William [name]., male    DOB: 08-Dec-1945  Age: 74 y.o. MRN: [id number]  CC: Penis Pain  This visit occurred during the SARS-CoV-2 public health emergency.  Safety protocols were in place, including screening questions prior to the visit, additional usage of staff PPE, and extensive cleaning of exam room while observing appropriate contact time as indicated for disinfecting solutions.   NEW TO ME  HPI [name]. presents for a 2 week hx of rash and pain around the distal penis.  Outpatient Medications Prior to Visit  Medication Sig Dispense Refill  . atorvastatin (LIPITOR) 40 MG tablet Take 1 tablet (40 mg total) by mouth daily. 90 tablet 3  . Cholecalciferol (VITAMIN D3) 50 MCG (2000 UT) capsule Take 1 capsule (2,000 Units total) by mouth daily. 100 capsule 3  . ELIQUIS 5 MG TABS tablet TAKE 1 TABLET BY MOUTH  TWICE DAILY 180 tablet 3  . potassium chloride SA (KLOR-CON) 20 MEQ tablet TAKE 1 TABLET BY MOUTH  DAILY 90 tablet 1  . valsartan-hydrochlorothiazide (DIOVAN-HCT) 160-25 MG tablet TAKE 1 TABLET BY MOUTH  DAILY 90 tablet 3  . hydrocortisone (ANUSOL-HC) 25 MG suppository Place 1 suppository (25 mg total) rectally at bedtime. For 5-7 days then use as needed (Patient not taking: Reported on 07/12/2019) 24 suppository 0  . vardenafil (LEVITRA) 20 MG tablet Take 0.5-1 tablets (10-20 mg total) by mouth daily as needed. (Patient not taking: Reported on 07/12/2019) 30 tablet 3   Facility-Administered Medications Prior to Visit  Medication Dose Route Frequency Provider Last Rate Last Admin  . 0.9 %  sodium chloride infusion  500 mL Intravenous Once Danis, Kirke Corin, MD        ROS Review of Systems  Constitutional: Negative.  Negative for chills, diaphoresis, fatigue and fever.  HENT: Negative.   Eyes: Negative.   Respiratory: Negative for cough, chest tightness, shortness of breath and wheezing.   Cardiovascular: Negative for chest pain, palpitations  and leg swelling.  Gastrointestinal: Negative for abdominal pain, constipation, diarrhea, nausea and vomiting.  Endocrine: Negative.   Genitourinary: Positive for genital sores, penile pain and penile swelling. Negative for decreased urine volume, difficulty urinating, discharge, dysuria, scrotal swelling, testicular pain and urgency.  Musculoskeletal: Negative.   Skin: Negative.  Negative for rash.  Neurological: Negative.   Hematological: Negative for adenopathy. Does not bruise/bleed easily.  Psychiatric/Behavioral: Negative.     Objective:  BP 140/82 (BP Location: Left Arm, Patient Position: Sitting, Cuff Size: Normal)   Pulse 83   Temp (!) 97.5 F (36.4 C) (Oral)   Resp 16   Ht 5\' 6"  (1.676 m)   Wt 194 lb 6.4 oz (88.2 kg)   SpO2 96%   BMI 31.38 kg/m   BP Readings from Last 3 Encounters:  07/12/19 140/82  04/24/19 134/76  12/28/18 138/84    Wt Readings from Last 3 Encounters:  07/12/19 194 lb 6.4 oz (88.2 kg)  04/24/19 190 lb (86.2 kg)  12/28/18 191 lb (86.6 kg)    Physical Exam Constitutional:      General: He is not in acute distress.    Appearance: Normal appearance. He is not ill-appearing, toxic-appearing or diaphoretic.  HENT:     Mouth/Throat:     Mouth: Mucous membranes are moist.  Eyes:     General: No scleral icterus.    Conjunctiva/sclera: Conjunctivae normal.  Cardiovascular:     Rate and Rhythm: Normal rate and regular rhythm.  Heart sounds: Murmur present. Systolic murmur present with a grade of 1/6. No diastolic murmur.  Pulmonary:     Effort: Pulmonary effort is normal.     Breath sounds: No wheezing, rhonchi or rales.  Abdominal:     General: Abdomen is flat.     Palpations: There is no mass.     Tenderness: There is no abdominal tenderness. There is no guarding.  Genitourinary:    Pubic Area: Rash present.     Penis: Uncircumcised. Paraphimosis, erythema and lesions present. No tenderness, discharge or swelling.      Testes: Normal.         Right: Mass not present.        Left: Mass not present.     Comments: The glans is pale and dusky.  The foreskin is retracted and cannot be pulled over the glans.  There is circumferential ulceration around the prepuce. Musculoskeletal:     Right lower leg: No edema.     Left lower leg: No edema.  Lymphadenopathy:     Lower Body: No right inguinal adenopathy. No left inguinal adenopathy.  Skin:    General: Skin is dry.     Coloration: Skin is not pale.  Neurological:     Mental Status: He is alert.     Lab Results  Component Value Date   WBC 5.1 04/24/2019   HGB 15.4 04/24/2019   HCT 44.6 04/24/2019   PLT 168.0 04/24/2019   GLUCOSE 115 (H) 04/24/2019   CHOL 172 04/24/2019   TRIG 152.0 (H) 04/24/2019   HDL 50.10 04/24/2019   LDLCALC 92 04/24/2019   ALT 55 (H) 04/24/2019   AST 35 04/24/2019   NA 142 04/24/2019   K 3.8 04/24/2019   CL 100 04/24/2019   CREATININE 0.96 04/24/2019   BUN 17 04/24/2019   CO2 34 (H) 04/24/2019   TSH 2.10 04/24/2019   PSA 2.37 04/24/2019   HGBA1C 6.3 04/24/2019    CT CORONARY MORPH W/CTA COR W/SCORE W/CA W/CM &/OR WO/CM  Addendum Date: 10/24/2017   ADDENDUM REPORT: 10/24/2017 17:04 CLINICAL DATA:  83 -year-old male with PAF and abnormal exercise treadmill stress test. EXAM: Cardiac/Coronary  CT TECHNIQUE: The patient was scanned on a [employer]. FINDINGS: A 120 kV prospective scan was triggered in the descending thoracic aorta at 111 HU's. Axial non-contrast 3 mm slices were carried out through the heart. The data set was analyzed on a dedicated work station and scored using the [place]. Gantry rotation speed was 250 msecs and collimation was .6 mm. 5 mg of iv Metoprolol and 0.8 mg of sl NTG was given. The 3D data set was reconstructed in 5% intervals of the 67-82 % of the R-R cycle. Diastolic phases were analyzed on a dedicated work station using MPR, MIP and VRT modes. The patient received 80 cc of contrast. Aorta: Normal  size. Mild calcifications in the descending aorta. No dissection. Aortic Valve:  Trileaflet.  No calcifications. Coronary Arteries:  Normal coronary origin.  Right dominance. RCA is a large dominant artery that gives rise to PDA and PLVB. There is minimal calcified plaque with associated stenosis 0-25%. Left main is a large artery that gives rise to LAD and LCX arteries. Distal left main has a mild calcified plaque with stenosis 0-25%. LAD is a large vessel that gives rise to two small diagonal arteries. There is mild mixed diffuse plaque with stenosis 0-25%. 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 LCX is  a medium size non-dominant artery. Ostial LCX artery has mild calcified plaque with associated stenosis 25-50%. Mid LCX artery has mixed plaque with stenosis 50-69%. Other findings: Normal pulmonary vein drainage into the left atrium. Normal let atrial appendage without a thrombus. Normal size of the pulmonary artery. IMPRESSION: 1. Coronary calcium score of 262. This was 54 percentile for age and sex matched control. 2. Normal coronary origin with right dominance. 3. 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 Mid LCX artery has mixed plaque with stenosis 50-69%. Additional analysis with CT FFR will be submitted. 4. 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 Electronically Signed   By: Ena Dawley   On: 10/24/2017 17:04   Result Date: 10/24/2017 EXAM: OVER-READ INTERPRETATION  CT CHEST The following report is an over-read performed by radiologist Dr. Rolm Baptise of [facility], [place] on 10/24/2017. This over-read does not include interpretation of cardiac or coronary anatomy or pathology. The coronary CTA interpretation by the cardiologist is attached. COMPARISON:  None. FINDINGS: Vascular: Heart is normal size.  Visualized aorta normal caliber. Mediastinum/Nodes: No adenopathy in the lower mediastinum or hila. Lungs/Pleura: Dependent  atelectasis in the lower lobes. No effusions. Upper Abdomen: Imaging into the upper abdomen shows no acute findings. Musculoskeletal: Chest wall soft tissues are unremarkable. No acute bony abnormality. IMPRESSION: No acute or significant extracardiac abnormality. Electronically Signed: By: Rolm Baptise M.D. On: 10/24/2017 14:11    Assessment & Plan:   Jammey was seen today for penis pain.  Diagnoses and all orders for this visit:  Erosive balanitis- This is most likely related to ischemia.  I do not see any evidence of infection.  Paraphimosis- I spoke to urology about this urgent matter.  They will see him today to see if this can be reduced.   I am having Zaine W. Rauh Jr. maintain his vardenafil, hydrocortisone, Eliquis, valsartan-hydrochlorothiazide, atorvastatin, Vitamin D3, and potassium chloride SA. We will continue to administer sodium chloride.  No orders of the defined types were placed in this encounter.    Follow-up: Return if symptoms worsen or fail to improve.  Scarlette Calico, MD

## 2019-07-12 NOTE — Telephone Encounter (Signed)
Copied from [place] [id number] [id number]. Topic: General - Inquiry >> Jul 12, 2019  9:49 AM Greggory Keen D wrote: Reason for CRM: Pt called saying he has a raash on his penis ans it has been getting red for 2 weeks and now ozing something out of the skin.  CB#  [phone]   F/u  Call the patient screening questions asked.   The patient informed of the location & address is given.  In-office visit with Dr. Ronnald Ramp today @ 3:00 pm

## 2019-09-03 ENCOUNTER — Encounter: Payer: Self-pay | Admitting: Medical

## 2019-09-03 ENCOUNTER — Telehealth (INDEPENDENT_AMBULATORY_CARE_PROVIDER_SITE_OTHER): Payer: Medicare Other | Admitting: Medical

## 2019-09-03 VITALS — BP 127/76 | HR 75 | Ht 66.0 in | Wt 190.0 lb

## 2019-09-03 DIAGNOSIS — I2510 Atherosclerotic heart disease of native coronary artery without angina pectoris: Secondary | ICD-10-CM | POA: Diagnosis not present

## 2019-09-03 DIAGNOSIS — Z7901 Long term (current) use of anticoagulants: Secondary | ICD-10-CM | POA: Diagnosis not present

## 2019-09-03 DIAGNOSIS — I10 Essential (primary) hypertension: Secondary | ICD-10-CM

## 2019-09-03 DIAGNOSIS — G4733 Obstructive sleep apnea (adult) (pediatric): Secondary | ICD-10-CM

## 2019-09-03 DIAGNOSIS — I480 Paroxysmal atrial fibrillation: Secondary | ICD-10-CM

## 2019-09-03 DIAGNOSIS — E785 Hyperlipidemia, unspecified: Secondary | ICD-10-CM

## 2019-09-03 MED ORDER — APIXABAN 5 MG PO TABS: 5.0000 mg | ORAL_TABLET | Freq: Two times a day (BID) | ORAL | 3 refills | Status: DC

## 2019-09-03 MED ORDER — ATORVASTATIN CALCIUM 80 MG PO TABS: 80.0000 mg | ORAL_TABLET | Freq: Every day | ORAL | 3 refills | Status: DC

## 2019-09-03 NOTE — Progress Notes (Signed)
Virtual Visit via Video Note   This visit type was conducted due to national recommendations for restrictions regarding the COVID-19 Pandemic (e.g. social distancing) in an effort to limit this patient's exposure and mitigate transmission in our community.  Due to his co-morbid illnesses, this patient is at least at moderate risk for complications without adequate follow up.  This format is felt to be most appropriate for this patient at this time.  All issues noted in this document were discussed and addressed.  A limited physical exam was performed with this format.  Please refer to the patient's chart for his consent to telehealth for [facility].   The patient was identified using 2 identifiers.  Date:  09/03/2019   ID:  William [name]., DOB 1946-02-24, MRN [id number]  Patient Location: Home Provider Location: Home  PCP:  Plotnikov, Evie Lacks, MD  Cardiologist:  Sanda Klein, MD  Electrophysiologist:  None   Evaluation Performed:  Follow-Up Visit  Chief Complaint:  Routine follow-up  History of Present Illness:    William [name]. is a 74 y.o. male with PMH of paroxysmal atrial fibrillation, moderate non-obstructive CAD on coronary CTA 2019, HTN, HLD, OSA on CPAP, and GERD, who presents for routine follow-up.   His last ischemic evaluation was a coronary CTA in 2019 to evaluate EKG changes on a treadmill stress test which showed calcium score of 262 placing him in the 61st percentile for age/sex, with moderate (50-69%) stenosis in the mLCx with an incidental finding of a small distal LAD intramyocardial bridge. Echo 2016 showed EF 65-70%, no RWMA, G1DD, an no significant valvular abnormalities.  He presents today for routine follow-up of his CAD/PAF. He has been doing well over the past year. He continues to stay active, walking/jogging most days, chopping wood, and babysitting his 59 yo grandson. He reports one episode of atrial fibrillation ~2 months ago which lasted 15  minutes before resolving spontaneously. He noted palpitations at the time but did not appreciate his heart racing. He denies any chest pain, SOB, DOE, LE edema, dizziness, lightheadedness, syncope, PND, orthopnea, or recent bleeding. He did have trouble with hemorrhoids not too long ago which has resolved. He reports compliance with his CPAP machine. He has been weaning off Pepsi in an effort to limit sodium/sugar intake. He shares exciting news that his grandson was accepted to play baseball at [employer] in the fall. He is fully vaccinated against COVID-19.   The patient does not have symptoms concerning for COVID-19 infection (fever, chills, cough, or new shortness of breath).    Past Medical History:  Diagnosis Date  . Atrial fibrillation ([place])   . Colon polyps    Dr Sharlett Iles  . GERD (gastroesophageal reflux disease)   . HTN (hypertension)   . Hyperlipidemia   . OSA on CPAP   . Prostatitis   . Sleep apnea    Past Surgical History:  Procedure Laterality Date  . APPENDECTOMY  2012   09/2010 in [place]  . HERNIA REPAIR  1975  . ROTATOR CUFF REPAIR  2008   left; Dr Para March     Current Meds  Medication Sig  . atorvastatin (LIPITOR) 40 MG tablet Take 1 tablet (40 mg total) by mouth daily.  . Cholecalciferol (VITAMIN D3) 50 MCG (2000 UT) capsule Take 1 capsule (2,000 Units total) by mouth daily.  Marland Kitchen ELIQUIS 5 MG TABS tablet TAKE 1 TABLET BY MOUTH  TWICE DAILY  . potassium chloride SA (KLOR-CON) 20 MEQ tablet  TAKE 1 TABLET BY MOUTH  DAILY  . valsartan-hydrochlorothiazide (DIOVAN-HCT) 160-25 MG tablet TAKE 1 TABLET BY MOUTH  DAILY  . vardenafil (LEVITRA) 20 MG tablet Take 0.5-1 tablets (10-20 mg total) by mouth daily as needed.   Current Facility-Administered Medications for the 09/03/19 encounter (Video Visit) with Abigail Butts., PA-C  Medication  . 0.9 %  sodium chloride infusion     Allergies:   Benazepril hcl   Social History   Tobacco Use  . Smoking status: Never Smoker  .  Smokeless tobacco: Never Used  Substance Use Topics  . Alcohol use: No  . Drug use: No     Family Hx: The patient's family history includes Asthma in his son; Coronary artery disease in his mother; Heart disease (age of onset: 57) in his father and mother; Hypertension in an other family member; Stroke in his sister. There is no history of Colon cancer, Stomach cancer, Rectal cancer, or Esophageal cancer.  ROS:   Please see the history of present illness.     All other systems reviewed and are negative.   Prior CV studies:   The following studies were reviewed today:  Echocardiogram 2016: Study Conclusions   - Left ventricle: The cavity size was normal. Systolic function was  vigorous. The estimated ejection fraction was in the range of 65%  to 70%. Wall motion was normal; there were no regional wall  motion abnormalities. Doppler parameters are consistent with  abnormal left ventricular relaxation (grade 1 diastolic  dysfunction).  - Right ventricle: The cavity size was at the upper limits of  normal. Wall thickness was normal.     Coronary CTA 2019: IMPRESSION: 1. Coronary calcium score of 262. This was 44 percentile for age and sex matched control.  2. Normal coronary origin with right dominance.  3. 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 Mid LCX artery has mixed plaque with stenosis 50-69%. Additional analysis with CT FFR will be submitted.  4. 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  Labs/Other Tests and Data Reviewed:    EKG:  An ECG dated 08/29/2018 was personally reviewed today and demonstrated:  NSR, rate 79bpm, non-specific ST-T wave abnormalities, no STE/D, no change from previous  Recent Labs: 04/24/2019: ALT 55; BUN 17; Creatinine, Ser 0.96; Hemoglobin 15.4; Platelets 168.0; Potassium 3.8; Sodium 142; TSH 2.10   Recent Lipid Panel Lab Results  Component Value Date/Time   CHOL 172  04/24/2019 11:10 AM   TRIG 152.0 (H) 04/24/2019 11:10 AM   TRIG 67 05/03/2006 07:53 AM   HDL 50.10 04/24/2019 11:10 AM   CHOLHDL 3 04/24/2019 11:10 AM   LDLCALC 92 04/24/2019 11:10 AM    Wt Readings from Last 3 Encounters:  09/03/19 190 lb (86.2 kg)  07/12/19 194 lb 6.4 oz (88.2 kg)  04/24/19 190 lb (86.2 kg)     Objective:    Vital Signs:  BP 127/76   Pulse 75   Ht 5\' 6"  (1.676 m)   Wt 190 lb (86.2 kg)   BMI 30.67 kg/m    VITAL SIGNS:  reviewed GEN:  no acute distress EYES:  sclerae anicteric, EOMI - Extraocular Movements Intact RESPIRATORY:  normal respiratory effort, symmetric expansion CARDIOVASCULAR:  no peripheral edema SKIN:  no rash, lesions or ulcers. MUSCULOSKELETAL:  no obvious deformities. NEURO:  alert and oriented x 3, no obvious focal deficit PSYCH:  normal affect  ASSESSMENT & PLAN:    1. Paroxysmal  atrial fibrillation: one brief episode in the past several months. HR's have been well controlled. No significant bleeding with eliquis. - Continue metoprolol for rate control - Continue eliquis for stroke ppx.   2. Coronary artery disease: moderate LCx disease noted on coronary CTA in 2019. Not on aspirin given need for anticoagulation. He is quite active and denies anginal complaints - Continue aggressive risk factor modifications below to prevent progression of disease - Continue healthy dietary/lifestyle modifications  3. HTN: BP 127/76 today; typically in the 120-130s/70s. We discussed the importance of maintaining BP <130/80 for management of his CAD. He will notify the office if BP is more often over goal - Continue metoprolol and valsartan-HCTZ  4. HLD: LDL 92 03/2019; goal <70 - Will increase atorvastatin to 80mg  daily  - Plan to repeat FLP in 8 weeks for close monitoring  5. OSA: compliant with CPAP   COVID-19 Education: The signs and symptoms of COVID-19 were discussed with the patient and how to seek care for testing (follow up with PCP or  arrange E-visit).  The importance of social distancing was discussed today.  Time:   Today, I have spent 10 minutes with the patient with telehealth technology discussing the above problems.     Medication Adjustments/Labs and Tests Ordered: Current medicines are reviewed at length with the patient today.  Concerns regarding medicines are outlined above.   Tests Ordered: No orders of the defined types were placed in this encounter.   Medication Changes: No orders of the defined types were placed in this encounter.   Follow Up:  In Person with Dr. Sallyanne Kuster in 1 year  Signed, Abigail Butts, PA-C  09/03/2019 4:14 PM    [facility]

## 2019-09-03 NOTE — Patient Instructions (Addendum)
Medication Instructions:   INCREASE Atorvastatin to 80 mg daily  *If you need a refill on your cardiac medications before your next appointment, please call your pharmacy*  Lab Work: You will need to have labs (blood work) drawn in 2 months:  FASTING LIPID PANEL-DO NOT EAT OR DRINK PAST MIDNIGHT. ([place]) If you have labs (blood work) drawn today and your tests are completely normal, you will receive your results only by: Marland Kitchen MyChart Message (if you have MyChart) OR . A paper copy in the mail If you have any lab test that is abnormal or we need to change your treatment, we will call you to review the results.  Testing/Procedures: NONE ordered at this time of appointment   Follow-Up: At [facility], you and your health needs are our priority.  As part of our continuing mission to provide you with exceptional heart care, we have created designated Provider Care Teams.  These Care Teams include your primary Cardiologist (physician) and Advanced Practice Providers (APPs -  Physician Assistants and Nurse Practitioners) who all work together to provide you with the care you need, when you need it.  Your next appointment:   12 month(s)  The format for your next appointment:   In Person  Provider:   Sanda Klein, MD  Other Instructions  CONTINUE Low Sodium/Salt diet  CONTINUE to monitor your blood pressure. If your blood pressure is persistently greater than 130/80 give our office a call.

## 2019-09-04 NOTE — Progress Notes (Signed)
TY, KK 

## 2019-11-06 ENCOUNTER — Other Ambulatory Visit: Payer: Self-pay | Admitting: Internal Medicine

## 2019-11-09 ENCOUNTER — Other Ambulatory Visit: Payer: Self-pay

## 2019-11-09 DIAGNOSIS — E785 Hyperlipidemia, unspecified: Secondary | ICD-10-CM

## 2019-11-09 LAB — LIPID PANEL
Chol/HDL Ratio: 2.7 ratio (ref 0.0–5.0)
Cholesterol, Total: 146 mg/dL (ref 100–199)
HDL: 55 mg/dL (ref >39)
LDL Chol Calc (NIH): 73 mg/dL (ref 0–99)
Triglycerides: 95 mg/dL (ref 0–149)
VLDL Cholesterol Cal: 18 mg/dL (ref 5–40)

## 2019-11-12 ENCOUNTER — Other Ambulatory Visit: Payer: Self-pay | Admitting: *Deleted

## 2019-11-12 MED ORDER — EZETIMIBE 10 MG PO TABS: 10.0000 mg | ORAL_TABLET | Freq: Every day | ORAL | 3 refills | Status: DC

## 2020-02-12 ENCOUNTER — Other Ambulatory Visit: Payer: Self-pay | Admitting: Internal Medicine

## 2020-02-15 ENCOUNTER — Other Ambulatory Visit: Payer: Self-pay

## 2020-02-15 MED ORDER — EZETIMIBE 10 MG PO TABS: 10.0000 mg | ORAL_TABLET | Freq: Every day | ORAL | 1 refills | Status: DC

## 2020-03-21 IMAGING — CT CT HEART MORP W/ CTA COR W/ SCORE W/ CA W/CM &/OR W/O CM
4 of 7 series · 8 of 20 positions shown, 9 images · IV contrast (APPLIED)
Comparison: None.

CLINICAL DATA: 71 -year-old male with PAF and abnormal exercise
treadmill stress test.

EXAM:
Cardiac/Coronary  CT
TECHNIQUE: The patient was scanned on a Phillips Force scanner.

[Series 6: best diast 64 % · axial · 0.47mm/px · z∈[-192,-146]mm · 2 of 348 slices shown, 3 images]
[im 116/348  vessel]
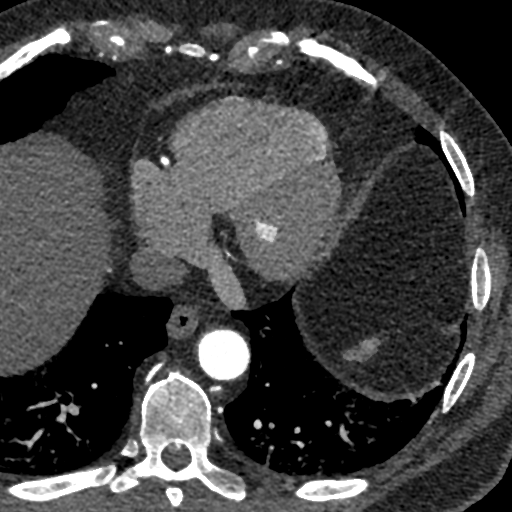
[im 116/348  lung]
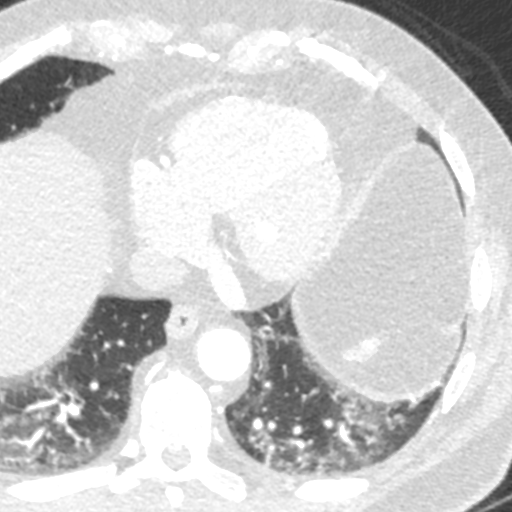
[im 232/348  vessel]
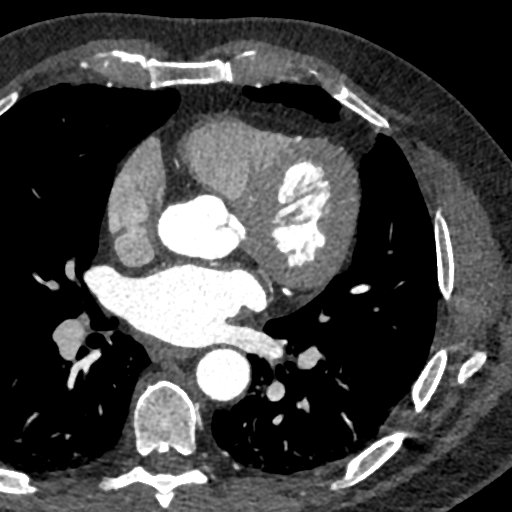

[Series 7: best syst 37 % · axial · 0.47mm/px · z∈[-192,-146]mm · 2 of 348 slices shown]
[im 116/348  vessel]
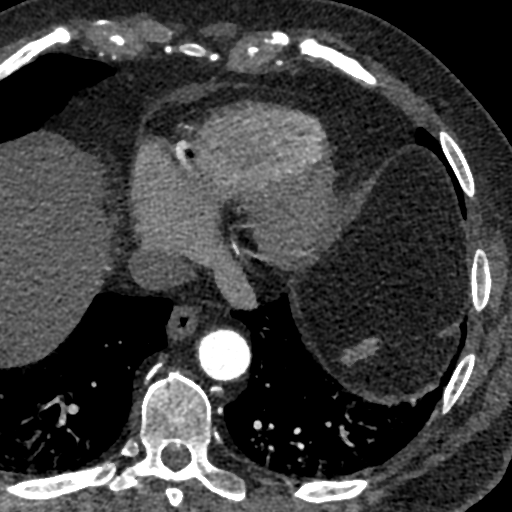
[im 232/348  vessel]
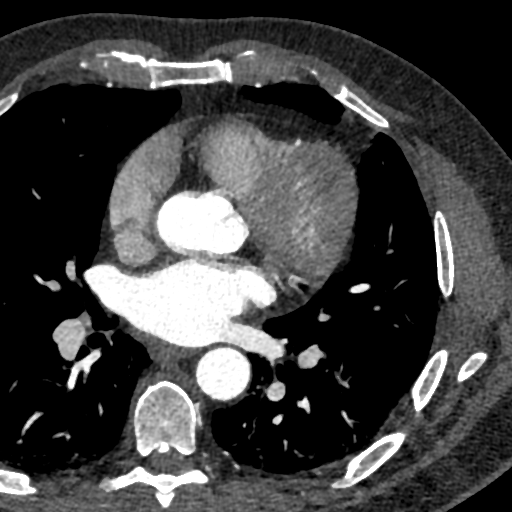

[Series 8: ts diast sharp 64 % · axial · 0.47mm/px · z∈[-192,-146]mm · 2 of 348 slices shown]
[im 116/348  lung]
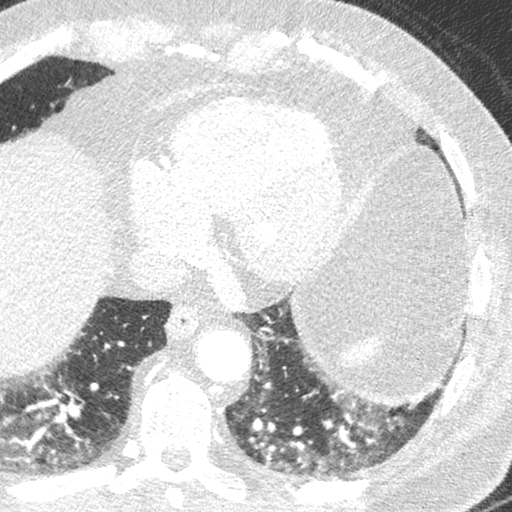
[im 232/348  lung]
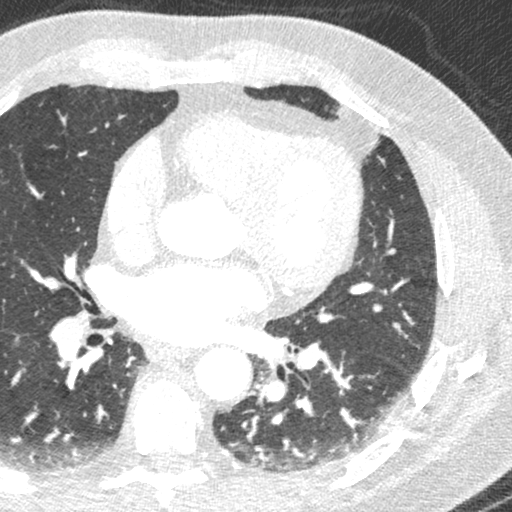

[Series 9: ts syst sharp 37 % · axial · 0.47mm/px · z∈[-192,-146]mm · 2 of 348 slices shown]
[im 116/348  lung]
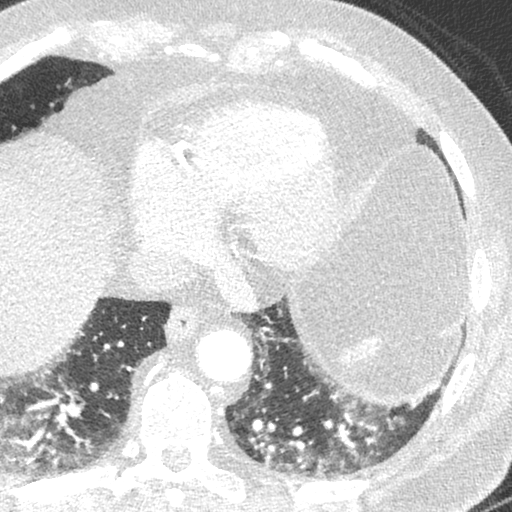
[im 232/348  lung]
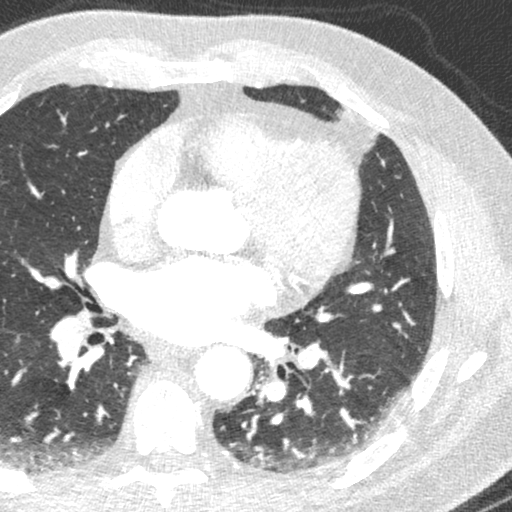

[8 of 20 positions shown; findings below may reference images not displayed]

FINDINGS: A 120 kV prospective scan was triggered in the descending thoracic
aorta at 111 HU's. Axial non-contrast 3 mm slices were carried out
through the heart. The data set was analyzed on a dedicated work
station and scored using the Agatson method. Gantry rotation speed
was 250 msecs and collimation was .6 mm. 5 mg of iv Metoprolol and
0.8 mg of sl NTG was given. The 3D data set was reconstructed in 5%
intervals of the 67-82 % of the R-R cycle. Diastolic phases were
analyzed on a dedicated work station using MPR, MIP and VRT modes.
The patient received 80 cc of contrast.

Aorta: Normal size. Mild calcifications in the descending aorta. No
dissection.

Aortic Valve:  Trileaflet.  No calcifications.

Coronary Arteries:  Normal coronary origin.  Right dominance.

RCA is a large dominant artery that gives rise to PDA and PLVB.
There is minimal calcified plaque with associated stenosis 0-25%.

Left main is a large artery that gives rise to LAD and LCX arteries.
Distal left main has a mild calcified plaque with stenosis 0-25%.

LAD is a large vessel that gives rise to two small diagonal
arteries. There is mild mixed diffuse plaque with stenosis 0-25%.
There is a small intramyocardial bridge in the distal LAD.

LCX is a medium size non-dominant artery. Ostial LCX artery has mild
calcified plaque with associated stenosis 25-50%. Mid LCX artery has
mixed plaque with stenosis 50-69%.

Other findings:

Normal pulmonary vein drainage into the left atrium.

Normal let atrial appendage without a thrombus.

Normal size of the pulmonary artery.
IMPRESSION: 1. Coronary calcium score of 262. This was 61 percentile for age and
sex matched control.

2. Normal coronary origin with right dominance.

3. Mild CAD in the distal left main, diffusely in LAD, ostial LCX
arteries. Mid LCX artery has mixed plaque with stenosis 50-69%.
Additional analysis with CT FFR will be submitted.

4. A small intramyocardial bridge is seen in the distal LAD.

EXAM:
OVER-READ INTERPRETATION  CT CHEST

The following report is an over-read performed by radiologist Dr.
Caleb Aujla [REDACTED] on 10/24/2017. This over-read
does not include interpretation of cardiac or coronary anatomy or
pathology. The coronary CTA interpretation by the cardiologist is
attached.
FINDINGS: Vascular: Heart is normal size.  Visualized aorta normal caliber.

Mediastinum/Nodes: No adenopathy in the lower mediastinum or hila.

Lungs/Pleura: Dependent atelectasis in the lower lobes. No
effusions.

Upper Abdomen: Imaging into the upper abdomen shows no acute
findings.

Musculoskeletal: Chest wall soft tissues are unremarkable. No acute
bony abnormality.
IMPRESSION: No acute or significant extracardiac abnormality.

## 2020-04-07 ENCOUNTER — Other Ambulatory Visit: Payer: Self-pay | Admitting: Cardiovascular Disease

## 2020-04-23 ENCOUNTER — Ambulatory Visit (INDEPENDENT_AMBULATORY_CARE_PROVIDER_SITE_OTHER): Payer: Medicare Other

## 2020-04-23 DIAGNOSIS — Z0000 Encounter for general adult medical examination without abnormal findings: Secondary | ICD-10-CM | POA: Diagnosis not present

## 2020-04-23 NOTE — Patient Instructions (Addendum)
William [name] , Thank you for taking time to come for your Medicare Wellness Visit. I appreciate your ongoing commitment to your health goals. Please review the following plan we discussed and let me know if I can assist you in the future.   Screening recommendations/referrals: Colonoscopy: 03/30/2018; due every 5 years Recommended yearly ophthalmology/optometry visit for glaucoma screening and checkup Recommended yearly dental visit for hygiene and checkup  Vaccinations: Influenza vaccine: 03/16/2019 Pneumococcal vaccine: up to date Tdap vaccine: 06/04/2011 Shingles vaccine: never done   Covid-19: up to date  Advanced directives: Advance directive discussed with you today. Even though you declined this today please call our office should you change your mind and we can give you the proper paperwork for you to fill out.  Conditions/risks identified: Yes; Reviewed health maintenance screenings with patient today and relevant education, vaccines, and/or referrals were provided. Please continue to do your personal lifestyle choices by: daily care of teeth and gums, regular physical activity (goal should be 5 days a week for 30 minutes), eat a healthy diet, avoid tobacco and drug use, limiting any alcohol intake, taking a low-dose aspirin (if not allergic or have been advised by your provider otherwise) and taking vitamins and minerals as recommended by your provider. Continue doing brain stimulating activities (puzzles, reading, adult coloring books, staying active) to keep memory sharp. Continue to eat heart healthy diet (full of fruits, vegetables, whole grains, lean protein, water--limit salt, fat, and sugar intake) and increase physical activity as tolerated.  Next appointment: Please schedule your next Medicare Wellness Visit with your Nurse Health Advisor in 1 year by calling [phone].  Preventive Care 42 Years and Older, Male Preventive care refers to lifestyle choices and visits with your  health care provider that can promote health and wellness. What does preventive care include?  A yearly physical exam. This is also called an annual well check.  Dental exams once or twice a year.  Routine eye exams. Ask your health care provider how often you should have your eyes checked.  Personal lifestyle choices, including:  Daily care of your teeth and gums.  Regular physical activity.  Eating a healthy diet.  Avoiding tobacco and drug use.  Limiting alcohol use.  Practicing safe sex.  Taking low doses of aspirin every day.  Taking vitamin and mineral supplements as recommended by your health care provider. What happens during an annual well check? The services and screenings done by your health care provider during your annual well check will depend on your age, overall health, lifestyle risk factors, and family history of disease. Counseling  Your health care provider may ask you questions about your:  Alcohol use.  Tobacco use.  Drug use.  Emotional well-being.  Home and relationship well-being.  Sexual activity.  Eating habits.  History of falls.  Memory and ability to understand (cognition).  Work and work Statistician. Screening  You may have the following tests or measurements:  Height, weight, and BMI.  Blood pressure.  Lipid and cholesterol levels. These may be checked every 5 years, or more frequently if you are over 80 years old.  Skin check.  Lung cancer screening. You may have this screening every year starting at age 59 if you have a 30-pack-year history of smoking and currently smoke or have quit within the past 15 years.  Fecal occult blood test (FOBT) of the stool. You may have this test every year starting at age 71.  Flexible sigmoidoscopy or colonoscopy. You may have a  sigmoidoscopy every 5 years or a colonoscopy every 10 years starting at age 59.  Prostate cancer screening. Recommendations will vary depending on your family  history and other risks.  Hepatitis C blood test.  Hepatitis B blood test.  Sexually transmitted disease (STD) testing.  Diabetes screening. This is done by checking your blood sugar (glucose) after you have not eaten for a while (fasting). You may have this done every 1-3 years.  Abdominal aortic aneurysm (AAA) screening. You may need this if you are a current or former smoker.  Osteoporosis. You may be screened starting at age 79 if you are at high risk. Talk with your health care provider about your test results, treatment options, and if necessary, the need for more tests. Vaccines  Your health care provider may recommend certain vaccines, such as:  Influenza vaccine. This is recommended every year.  Tetanus, diphtheria, and acellular pertussis (Tdap, Td) vaccine. You may need a Td booster every 10 years.  Zoster vaccine. You may need this after age 29.  Pneumococcal 13-valent conjugate (PCV13) vaccine. One dose is recommended after age 75.  Pneumococcal polysaccharide (PPSV23) vaccine. One dose is recommended after age 57. Talk to your health care provider about which screenings and vaccines you need and how often you need them. This information is not intended to replace advice given to you by your health care provider. Make sure you discuss any questions you have with your health care provider. Document Released: 07/11/2015 Document Revised: 03/03/2016 Document Reviewed: 04/15/2015 Elsevier Interactive Patient Education  2017 [place] Prevention in the Home Falls can cause injuries. They can happen to people of all ages. There are many things you can do to make your home safe and to help prevent falls. What can I do on the outside of my home?  Regularly fix the edges of walkways and driveways and fix any cracks.  Remove anything that might make you trip as you walk through a door, such as a raised step or threshold.  Trim any bushes or trees on the path to  your home.  Use bright outdoor lighting.  Clear any walking paths of anything that might make someone trip, such as rocks or tools.  Regularly check to see if handrails are loose or broken. Make sure that both sides of any steps have handrails.  Any raised decks and porches should have guardrails on the edges.  Have any leaves, snow, or ice cleared regularly.  Use sand or salt on walking paths during winter.  Clean up any spills in your garage right away. This includes oil or grease spills. What can I do in the bathroom?  Use night lights.  Install grab bars by the toilet and in the tub and shower. Do not use towel bars as grab bars.  Use non-skid mats or decals in the tub or shower.  If you need to sit down in the shower, use a plastic, non-slip stool.  Keep the floor dry. Clean up any water that spills on the floor as soon as it happens.  Remove soap buildup in the tub or shower regularly.  Attach bath mats securely with double-sided non-slip rug tape.  Do not have throw rugs and other things on the floor that can make you trip. What can I do in the bedroom?  Use night lights.  Make sure that you have a light by your bed that is easy to reach.  Do not use any sheets or blankets that are  too big for your bed. They should not hang down onto the floor.  Have a firm chair that has side arms. You can use this for support while you get dressed.  Do not have throw rugs and other things on the floor that can make you trip. What can I do in the kitchen?  Clean up any spills right away.  Avoid walking on wet floors.  Keep items that you use a lot in easy-to-reach places.  If you need to reach something above you, use a strong step stool that has a grab bar.  Keep electrical cords out of the way.  Do not use floor polish or wax that makes floors slippery. If you must use wax, use non-skid floor wax.  Do not have throw rugs and other things on the floor that can make  you trip. What can I do with my stairs?  Do not leave any items on the stairs.  Make sure that there are handrails on both sides of the stairs and use them. Fix handrails that are broken or loose. Make sure that handrails are as long as the stairways.  Check any carpeting to make sure that it is firmly attached to the stairs. Fix any carpet that is loose or worn.  Avoid having throw rugs at the top or bottom of the stairs. If you do have throw rugs, attach them to the floor with carpet tape.  Make sure that you have a light switch at the top of the stairs and the bottom of the stairs. If you do not have them, ask someone to add them for you. What else can I do to help prevent falls?  Wear shoes that:  Do not have high heels.  Have rubber bottoms.  Are comfortable and fit you well.  Are closed at the toe. Do not wear sandals.  If you use a stepladder:  Make sure that it is fully opened. Do not climb a closed stepladder.  Make sure that both sides of the stepladder are locked into place.  Ask someone to hold it for you, if possible.  Clearly mark and make sure that you can see:  Any grab bars or handrails.  First and last steps.  Where the edge of each step is.  Use tools that help you move around (mobility aids) if they are needed. These include:  Canes.  Walkers.  Scooters.  Crutches.  Turn on the lights when you go into a dark area. Replace any light bulbs as soon as they burn out.  Set up your furniture so you have a clear path. Avoid moving your furniture around.  If any of your floors are uneven, fix them.  If there are any pets around you, be aware of where they are.  Review your medicines with your doctor. Some medicines can make you feel dizzy. This can increase your chance of falling. Ask your doctor what other things that you can do to help prevent falls. This information is not intended to replace advice given to you by your health care provider.  Make sure you discuss any questions you have with your health care provider. Document Released: 04/10/2009 Document Revised: 11/20/2015 Document Reviewed: 07/19/2014 Elsevier Interactive Patient Education  2017 [employer].

## 2020-04-23 NOTE — Progress Notes (Addendum)
I connected with William William [name] William [name], William William [name] William [name].  today by telephone and verified that I am speaking with the correct person using two identifiers. Location patient: home Location provider: work Persons participating in the virtual visit: William William [name], William [name]. and William William [name] Abu, LPN.   I discussed the limitations, risks, security and privacy concerns of performing an evaluation and management service by telephone and the availability of in person appointments. I also discussed with the patient that there may be a patient responsible charge related to this service. The patient expressed understanding and verbally consented to this telephonic visit.    Interactive audio and video telecommunications were attempted between this provider and patient, however failed, due to patient having technical difficulties OR patient did not have access to video capability.  We continued and completed visit with audio only.  Some vital signs may be absent or patient reported.   Time Spent with patient on telephone encounter: 20 minutes  Subjective:   William William [name] William [name]. is a 74 y.o. male who presents for Medicare Annual/Subsequent preventive examination.  Review of Systems    No ROS. Medicare Wellness Visit Cardiac Risk Factors include: advanced age (>42men, >58 women);dyslipidemia;family history of premature cardiovascular disease;hypertension;male gender     Objective:    There were no vitals filed for this visit. There is no height or weight on file to calculate BMI.  Advanced Directives 04/23/2020  Does Patient Have a Medical Advance Directive? No  Would patient like information on creating a medical advance directive? No - Patient declined    Current Medications (verified) Outpatient Encounter Medications as of 04/23/2020  Medication Sig   apixaban (ELIQUIS) 5 MG TABS tablet Take 1 tablet (5 mg total) by mouth 2 (two) times daily.   atorvastatin (LIPITOR) 80 MG tablet Take 1 tablet (80 mg total) by mouth  daily.   Cholecalciferol (VITAMIN D3) 50 MCG (2000 UT) capsule Take 1 capsule (2,000 Units total) by mouth daily.   clotrimazole-betamethasone (LOTRISONE) cream APPLY TO AFFECTED AREA EVERY DAY AS NEEDED   ezetimibe (ZETIA) 10 MG tablet TAKE 1 TABLET BY MOUTH  DAILY   potassium chloride SA (KLOR-CON) 20 MEQ tablet TAKE 1 TABLET BY MOUTH  DAILY   triamcinolone cream (KENALOG) 0.1 % APPLY TO AFFECTED AREA TWICE A DAY   valsartan-hydrochlorothiazide (DIOVAN-HCT) 160-25 MG tablet TAKE 1 TABLET BY MOUTH  DAILY   vardenafil (LEVITRA) 20 MG tablet Take 0.5-1 tablets (10-20 mg total) by mouth daily as needed.   Facility-Administered Encounter Medications as of 04/23/2020  Medication   0.9 %  sodium chloride infusion    Allergies (verified) Benazepril hcl   History: Past Medical History:  Diagnosis Date   Atrial fibrillation (HCC)    Colon polyps    Dr Sharlett Iles   GERD (gastroesophageal reflux disease)    HTN (hypertension)    Hyperlipidemia    OSA on CPAP    Prostatitis    Sleep apnea    Past Surgical History:  Procedure Laterality Date   APPENDECTOMY  2012   09/2010 in [place]   HERNIA REPAIR  1975   ROTATOR CUFF REPAIR  2008   left; Dr Para March   Family History  Problem Relation Age of Onset   Coronary artery disease Mother    Heart disease Mother 48       MI   Hypertension Other    Heart disease Father 89       MI   Stroke Sister        Cranial  anuerysm   Asthma Son    Colon cancer Neg Hx    Stomach cancer Neg Hx    Rectal cancer Neg Hx    Esophageal cancer Neg Hx    Social History   Socioeconomic History   Marital status: Married    Spouse name: Not on file   Number of children: 3   Years of education: Not on file   Highest education level: Not on file  Occupational History   Occupation: Accountant/Retired  Tobacco Use   Smoking status: Never Smoker   Smokeless tobacco: Never Used  Scientific laboratory technician Use: Never used  Substance and Sexual Activity    Alcohol use: No   Drug use: No   Sexual activity: Yes  Other Topics Concern   Not on file  Social History Narrative   Regular Exercise - Yes, gym            Social Determinants of Health   Financial Resource Strain: Low Risk    Difficulty of Paying Living Expenses: Not hard at all  Food Insecurity: No Food Insecurity   Worried About Charity fundraiser in the Last Year: Never true   [place] in the Last Year: Never true  Transportation Needs: No Transportation Needs   Lack of Transportation (Medical): No   Lack of Transportation (Non-Medical): No  Physical Activity: Sufficiently Active   Days of Exercise per Week: 5 days   Minutes of Exercise per Session: 30 min  Stress: No Stress Concern Present   Feeling of Stress : Not at all  Social Connections: Socially Integrated   Frequency of Communication with Friends and Family: More than three times a week   Frequency of Social Gatherings with Friends and Family: More than three times a week   Attends Religious Services: More than 4 times per year   Active Member of [employer] or Organizations: Yes   Attends Music therapist: More than 4 times per year   Marital Status: Married    Tobacco Counseling Counseling given: Not Answered   Clinical Intake:  Pre-visit preparation completed: Yes  Pain : No/denies pain     Nutritional Risks: None Diabetes: No  How often do you need to have someone help you when you read instructions, pamphlets, or other written materials from your doctor or pharmacy?: 1 - Never What is the last grade level you completed in school?: Bachelor's Degree  Diabetic? no  Interpreter Needed?: No  Information entered by :: William William [name] William [name] N. William William [name] Schillaci, LPN   Activities of Daily Living In your present state of health, do you have any difficulty performing the following activities: 04/23/2020  Hearing? N  Vision? N  Difficulty concentrating or making decisions? N  Walking or climbing  stairs? N  Dressing or bathing? N  Doing errands, shopping? N  Preparing Food and eating ? N  Using the Toilet? N  In the past six months, have you accidently leaked urine? N  Do you have problems with loss of bowel control? N  Managing your Medications? N  Managing your Finances? N  Housekeeping or managing your Housekeeping? N  Some recent data might be hidden    Patient Care Team: Plotnikov, Evie Lacks, MD as PCP - General Croitoru, Mihai, MD as PCP - Cardiology (Cardiology) Sable Feil, MD as Attending Physician (Gastroenterology) Clance, Armando Reichert, MD (Pulmonary Disease)  Indicate any recent Medical Services you may have received from other than [facility] providers in the past  year (date may be approximate).     Assessment:   This is a routine wellness examination for Octave.  Hearing/Vision screen No exam data present  Dietary issues and exercise activities discussed: Current Exercise Habits: Home exercise routine, Type of exercise: walking;Other - see comments (yard work and building), Time (Minutes): 30, Frequency (Times/Week): 5, Weekly Exercise (Minutes/Week): 150, Intensity: Moderate, Exercise limited by: cardiac condition(s)  Goals   None    Depression Screen PHQ 2/9 Scores 04/23/2020 05/17/2018 05/12/2017 11/04/2015 10/08/2014  PHQ - 2 Score 0 0 0 0 0    Fall Risk Fall Risk  04/23/2020 05/17/2018 05/12/2017 11/04/2015 10/08/2014  Falls in the past year? 0 0 No No No  Number falls in past yr: 0 - - - -  Injury with Fall? 0 - - - -  Risk for fall due to : No Fall Risks - - - -  Follow up Falls evaluation completed Falls evaluation completed - - -    Any stairs in or around the home? Yes  If so, are there any without handrails? No  Home free of loose throw rugs in walkways, pet beds, electrical cords, etc? Yes  Adequate lighting in your home to reduce risk of falls? Yes   ASSISTIVE DEVICES UTILIZED TO PREVENT FALLS:  Life alert? No  Use of a cane, walker or  w/c? No  Grab bars in the bathroom? Yes  Shower chair or bench in shower? Yes  Elevated toilet seat or a handicapped toilet? Yes   TIMED UP AND GO:  Was the test performed? No .  Length of time to ambulate 10 feet: 0 sec.   Gait steady and fast without use of assistive device  Cognitive Function: Patient is cogitatively intact.        Immunizations Immunization History  Administered Date(s) Administered   Fluad Quad(high Dose 65+) 03/16/2019   Hepatitis A 05/11/2006   Influenza Split 05/17/2011, 05/17/2012   Influenza Whole 05/11/2006, 05/23/2007, 05/14/2008, 05/19/2009, 05/14/2010   Influenza, High Dose Seasonal PF 05/12/2017, 05/17/2018   Influenza,inj,Quad PF,6+ Mos 06/07/2013, 05/05/2016   Influenza-Unspecified 04/21/2015   Pneumococcal Conjugate-13 05/17/2008   Pneumococcal Polysaccharide-23 11/04/2015   Tdap 06/04/2011   Zoster 12/05/2012    TDAP status: Up to date Flu Vaccine status: Up to date Pneumococcal vaccine status: Up to date Covid-19 vaccine status: Completed vaccines  Qualifies for Shingles Vaccine? Yes   Zostavax completed Yes   Shingrix Completed?: No.    Education has been provided regarding the importance of this vaccine. Patient has been advised to call insurance company to determine out of pocket expense if they have not yet received this vaccine. Advised may also receive vaccine at local pharmacy or Health Dept. Verbalized acceptance and understanding.  Screening Tests Health Maintenance  Topic Date Due   COVID-19 Vaccine (1) Never done   INFLUENZA VACCINE  01/27/2020   TETANUS/TDAP  06/03/2021   COLONOSCOPY  03/31/2023   Hepatitis C Screening  Completed   PNA vac Low Risk Adult  Completed    Health Maintenance  Health Maintenance Due  Topic Date Due   COVID-19 Vaccine (1) Never done   INFLUENZA VACCINE  01/27/2020    Colorectal cancer screening: Completed 03/30/2018. Repeat every 5 years  Lung Cancer Screening: (Low Dose CT Chest  recommended if Age 77-80 years, 30 pack-year currently smoking OR have quit w/in 15years.) does not qualify.   Lung Cancer Screening Referral: no  Additional Screening:  Hepatitis C Screening: does qualify; Completed yes  Vision Screening: Recommended annual ophthalmology exams for early detection of glaucoma and other disorders of the eye. Is the patient up to date with their annual eye exam?  Yes  Who is the provider or what is the name of the office in which the patient attends annual eye exams? Melissa Noon, MD If pt is not established with a provider, would they like to be referred to a provider to establish care? No .   Dental Screening: Recommended annual dental exams for proper oral hygiene  Community Resource Referral / Chronic Care Management: CRR required this visit?  No   CCM required this visit?  No      Plan:     I have personally reviewed and noted the following in the patient's chart:   Medical and social history Use of alcohol, tobacco or illicit drugs  Current medications and supplements Functional ability and status Nutritional status Physical activity Advanced directives List of other physicians Hospitalizations, surgeries, and ER visits in previous 12 months Vitals Screenings to include cognitive, depression, and falls Referrals and appointments  In addition, I have reviewed and discussed with patient certain preventive protocols, quality metrics, and best practice recommendations. A written personalized care plan for preventive services as well as general preventive health recommendations were provided to patient.     Sheral Flow, LPN   14/03/3012   Nurse Notes: Patient is cogitatively intact. There were no vitals filed for this visit. There is no height or weight on file to calculate BMI. Patient stated that she has no issues with gait or balance; does not use any assistive devices.  Medical screening examination/treatment/procedure(s)  were performed by non-physician practitioner and as supervising physician I was immediately available for consultation/collaboration.  I agree with above. Lew Dawes, MD

## 2020-04-24 ENCOUNTER — Ambulatory Visit (INDEPENDENT_AMBULATORY_CARE_PROVIDER_SITE_OTHER): Payer: Medicare Other | Admitting: Internal Medicine

## 2020-04-24 ENCOUNTER — Other Ambulatory Visit: Payer: Self-pay

## 2020-04-24 ENCOUNTER — Encounter: Payer: Self-pay | Admitting: Internal Medicine

## 2020-04-24 VITALS — BP 128/70 | HR 81 | Temp 97.9°F | Ht 66.0 in | Wt 193.0 lb

## 2020-04-24 DIAGNOSIS — E785 Hyperlipidemia, unspecified: Secondary | ICD-10-CM | POA: Diagnosis not present

## 2020-04-24 DIAGNOSIS — Z125 Encounter for screening for malignant neoplasm of prostate: Secondary | ICD-10-CM | POA: Diagnosis not present

## 2020-04-24 DIAGNOSIS — R739 Hyperglycemia, unspecified: Secondary | ICD-10-CM | POA: Diagnosis not present

## 2020-04-24 DIAGNOSIS — Z23 Encounter for immunization: Secondary | ICD-10-CM | POA: Diagnosis not present

## 2020-04-24 DIAGNOSIS — Z0000 Encounter for general adult medical examination without abnormal findings: Secondary | ICD-10-CM

## 2020-04-24 LAB — COMPREHENSIVE METABOLIC PANEL
ALT: 46 U/L (ref 0–53)
AST: 31 U/L (ref 0–37)
Albumin: 4.7 g/dL (ref 3.5–5.2)
Alkaline Phosphatase: 102 U/L (ref 39–117)
BUN: 18 mg/dL (ref 6–23)
CO2: 33 mEq/L — ABNORMAL HIGH (ref 19–32)
Calcium: 9.2 mg/dL (ref 8.4–10.5)
Chloride: 100 mEq/L (ref 96–112)
Creatinine, Ser: 1 mg/dL (ref 0.40–1.50)
GFR: 74.69 mL/min (ref >60.00)
Glucose, Bld: 135 mg/dL — ABNORMAL HIGH (ref 70–99)
Potassium: 4.2 mEq/L (ref 3.5–5.1)
Sodium: 142 mEq/L (ref 135–145)
Total Bilirubin: 1.3 mg/dL — ABNORMAL HIGH (ref 0.2–1.2)
Total Protein: 6.9 g/dL (ref 6.0–8.3)

## 2020-04-24 LAB — CBC WITH DIFFERENTIAL/PLATELET
Basophils Absolute: 0.1 10*3/uL (ref 0.0–0.1)
Basophils Relative: 1.2 % (ref 0.0–3.0)
Eosinophils Absolute: 0.2 10*3/uL (ref 0.0–0.7)
Eosinophils Relative: 3 % (ref 0.0–5.0)
HCT: 44.4 % (ref 39.0–52.0)
Hemoglobin: 15.5 g/dL (ref 13.0–17.0)
Lymphocytes Relative: 24.6 % (ref 12.0–46.0)
Lymphs Abs: 1.4 10*3/uL (ref 0.7–4.0)
MCHC: 34.8 g/dL (ref 30.0–36.0)
MCV: 88.4 fl (ref 78.0–100.0)
Monocytes Absolute: 0.4 10*3/uL (ref 0.1–1.0)
Monocytes Relative: 8.1 % (ref 3.0–12.0)
Neutro Abs: 3.5 10*3/uL (ref 1.4–7.7)
Neutrophils Relative %: 63.1 % (ref 43.0–77.0)
Platelets: 160 10*3/uL (ref 150.0–400.0)
RBC: 5.03 Mil/uL (ref 4.22–5.81)
RDW: 14.3 % (ref 11.5–15.5)
WBC: 5.5 10*3/uL (ref 4.0–10.5)

## 2020-04-24 LAB — LIPID PANEL
Cholesterol: 134 mg/dL (ref 0–200)
HDL: 55.4 mg/dL (ref >39.00)
LDL Cholesterol: 60 mg/dL (ref 0–99)
NonHDL: 78.15
Total CHOL/HDL Ratio: 2
Triglycerides: 92 mg/dL (ref 0.0–149.0)
VLDL: 18.4 mg/dL (ref 0.0–40.0)

## 2020-04-24 LAB — URINALYSIS
Bilirubin Urine: NEGATIVE
Hgb urine dipstick: NEGATIVE
Ketones, ur: NEGATIVE
Leukocytes,Ua: NEGATIVE
Nitrite: NEGATIVE
Specific Gravity, Urine: 1.02 (ref 1.000–1.030)
Total Protein, Urine: NEGATIVE
Urine Glucose: NEGATIVE
Urobilinogen, UA: 4 — AB (ref 0.0–1.0)
pH: 6 (ref 5.0–8.0)

## 2020-04-24 LAB — TSH: TSH: 2.37 u[IU]/mL (ref 0.35–4.50)

## 2020-04-24 LAB — PSA: PSA: 2.04 ng/mL (ref 0.10–4.00)

## 2020-04-24 LAB — HEMOGLOBIN A1C: Hgb A1c MFr Bld: 7.1 % — ABNORMAL HIGH (ref 4.6–6.5)

## 2020-04-24 MED ORDER — EZETIMIBE 10 MG PO TABS
10.0000 mg | ORAL_TABLET | Freq: Every day | ORAL | 3 refills | Status: DC
Start: 2020-04-24 — End: 2021-01-15

## 2020-04-24 NOTE — Progress Notes (Signed)
Subjective:  Patient ID: William [name]., male    DOB: 07-Sep-1945  Age: 74 y.o. MRN: [id number]  CC: Annual Exam   HPI Crit W [name]. presents for a well exam  Outpatient Medications Prior to Visit  Medication Sig Dispense Refill  . apixaban (ELIQUIS) 5 MG TABS tablet Take 1 tablet (5 mg total) by mouth 2 (two) times daily. 180 tablet 3  . atorvastatin (LIPITOR) 80 MG tablet Take 1 tablet (80 mg total) by mouth daily. 90 tablet 3  . Cholecalciferol (VITAMIN D3) 50 MCG (2000 UT) capsule Take 1 capsule (2,000 Units total) by mouth daily. 100 capsule 3  . clotrimazole-betamethasone (LOTRISONE) cream APPLY TO AFFECTED AREA EVERY DAY AS NEEDED    . ezetimibe (ZETIA) 10 MG tablet TAKE 1 TABLET BY MOUTH  DAILY 60 tablet 5  . potassium chloride SA (KLOR-CON) 20 MEQ tablet TAKE 1 TABLET BY MOUTH  DAILY 90 tablet 3  . triamcinolone cream (KENALOG) 0.1 % APPLY TO AFFECTED AREA TWICE A DAY    . valsartan-hydrochlorothiazide (DIOVAN-HCT) 160-25 MG tablet TAKE 1 TABLET BY MOUTH  DAILY 90 tablet 3  . vardenafil (LEVITRA) 20 MG tablet Take 0.5-1 tablets (10-20 mg total) by mouth daily as needed. 30 tablet 3   Facility-Administered Medications Prior to Visit  Medication Dose Route Frequency Provider Last Rate Last Admin  . 0.9 %  sodium chloride infusion  500 mL Intravenous Once Nelida Meuse III, MD        ROS: Review of Systems  Constitutional: Negative for appetite change, fatigue and unexpected weight change.  HENT: Negative for congestion, nosebleeds, sneezing, sore throat and trouble swallowing.   Eyes: Negative for itching and visual disturbance.  Respiratory: Negative for cough.   Cardiovascular: Negative for chest pain, palpitations and leg swelling.  Gastrointestinal: Negative for abdominal distention, blood in stool, diarrhea and nausea.  Genitourinary: Negative for frequency and hematuria.  Musculoskeletal: Negative for back pain, gait problem, joint swelling and neck pain.    Skin: Negative for rash.  Neurological: Negative for dizziness, tremors, speech difficulty and weakness.  Psychiatric/Behavioral: Negative for agitation, dysphoric mood, sleep disturbance and suicidal ideas. The patient is not nervous/anxious.     Objective:  BP 128/70 (BP Location: Left Arm, Patient Position: Sitting, Cuff Size: Large)   Pulse 81   Temp 97.9 F (36.6 C) (Oral)   Ht 5\' 6"  (1.676 m)   Wt 193 lb (87.5 kg)   SpO2 95%   BMI 31.15 kg/m   BP Readings from Last 3 Encounters:  04/24/20 128/70  09/03/19 127/76  07/12/19 140/82    Wt Readings from Last 3 Encounters:  04/24/20 193 lb (87.5 kg)  09/03/19 190 lb (86.2 kg)  07/12/19 194 lb 6.4 oz (88.2 kg)    Physical Exam Constitutional:      General: He is not in acute distress.    Appearance: He is well-developed.     Comments: NAD  Eyes:     Conjunctiva/sclera: Conjunctivae normal.     Pupils: Pupils are equal, round, and reactive to light.  Neck:     Thyroid: No thyromegaly.     Vascular: No JVD.  Cardiovascular:     Rate and Rhythm: Normal rate and regular rhythm.     Heart sounds: Normal heart sounds. No murmur heard.  No friction rub. No gallop.   Pulmonary:     Effort: Pulmonary effort is normal. No respiratory distress.     Breath sounds: Normal breath  sounds. No wheezing or rales.  Chest:     Chest wall: No tenderness.  Abdominal:     General: Bowel sounds are normal. There is no distension.     Palpations: Abdomen is soft. There is no mass.     Tenderness: There is no abdominal tenderness. There is no guarding or rebound.  Musculoskeletal:        General: No tenderness. Normal range of motion.     Cervical back: Normal range of motion.  Lymphadenopathy:     Cervical: No cervical adenopathy.  Skin:    General: Skin is warm and dry.     Findings: No rash.  Neurological:     Mental Status: He is alert and oriented to person, place, and time.     Cranial Nerves: No cranial nerve deficit.      Motor: No abnormal muscle tone.     Coordination: Coordination normal.     Gait: Gait normal.     Deep Tendon Reflexes: Reflexes are normal and symmetric.  Psychiatric:        Behavior: Behavior normal.        Thought Content: Thought content normal.        Judgment: Judgment normal.     Lab Results  Component Value Date   WBC 5.1 04/24/2019   HGB 15.4 04/24/2019   HCT 44.6 04/24/2019   PLT 168.0 04/24/2019   GLUCOSE 115 (H) 04/24/2019   CHOL 146 11/09/2019   TRIG 95 11/09/2019   HDL 55 11/09/2019   LDLCALC 73 11/09/2019   ALT 55 (H) 04/24/2019   AST 35 04/24/2019   NA 142 04/24/2019   K 3.8 04/24/2019   CL 100 04/24/2019   CREATININE 0.96 04/24/2019   BUN 17 04/24/2019   CO2 34 (H) 04/24/2019   TSH 2.10 04/24/2019   PSA 2.37 04/24/2019   HGBA1C 6.3 04/24/2019    CT CORONARY MORPH W/CTA COR W/SCORE W/CA W/CM &/OR WO/CM  Addendum Date: 10/24/2017   ADDENDUM REPORT: 10/24/2017 17:04 CLINICAL DATA:  12 -year-old male with PAF and abnormal exercise treadmill stress test. EXAM: Cardiac/Coronary  CT TECHNIQUE: The patient was scanned on a [employer]. FINDINGS: A 120 kV prospective scan was triggered in the descending thoracic aorta at 111 HU's. Axial non-contrast 3 mm slices were carried out through the heart. The data set was analyzed on a dedicated work station and scored using the [place]. Gantry rotation speed was 250 msecs and collimation was .6 mm. 5 mg of iv Metoprolol and 0.8 mg of sl NTG was given. The 3D data set was reconstructed in 5% intervals of the 67-82 % of the R-R cycle. Diastolic phases were analyzed on a dedicated work station using MPR, MIP and VRT modes. The patient received 80 cc of contrast. Aorta: Normal size. Mild calcifications in the descending aorta. No dissection. Aortic Valve:  Trileaflet.  No calcifications. Coronary Arteries:  Normal coronary origin.  Right dominance. RCA is a large dominant artery that gives rise to PDA and PLVB.  There is minimal calcified plaque with associated stenosis 0-25%. Left main is a large artery that gives rise to LAD and LCX arteries. Distal left main has a mild calcified plaque with stenosis 0-25%. LAD is a large vessel that gives rise to two small diagonal arteries. There is mild mixed diffuse plaque with stenosis 0-25%. 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 LCX is a medium size non-dominant artery. Ostial LCX artery has mild calcified  plaque with associated stenosis 25-50%. Mid LCX artery has mixed plaque with stenosis 50-69%. Other findings: Normal pulmonary vein drainage into the left atrium. Normal let atrial appendage without a thrombus. Normal size of the pulmonary artery. IMPRESSION: 1. Coronary calcium score of 262. This was 38 percentile for age and sex matched control. 2. Normal coronary origin with right dominance. 3. 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 Mid LCX artery has mixed plaque with stenosis 50-69%. Additional analysis with CT FFR will be submitted. 4. 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 Electronically Signed   By: Ena Dawley   On: 10/24/2017 17:04   Result Date: 10/24/2017 EXAM: OVER-READ INTERPRETATION  CT CHEST The following report is an over-read performed by radiologist Dr. Rolm Baptise of [facility], [place] on 10/24/2017. This over-read does not include interpretation of cardiac or coronary anatomy or pathology. The coronary CTA interpretation by the cardiologist is attached. COMPARISON:  None. FINDINGS: Vascular: Heart is normal size.  Visualized aorta normal caliber. Mediastinum/Nodes: No adenopathy in the lower mediastinum or hila. Lungs/Pleura: Dependent atelectasis in the lower lobes. No effusions. Upper Abdomen: Imaging into the upper abdomen shows no acute findings. Musculoskeletal: Chest wall soft tissues are unremarkable. No acute bony abnormality. IMPRESSION: No acute or significant  extracardiac abnormality. Electronically Signed: By: Rolm Baptise M.D. On: 10/24/2017 14:11    Assessment & Plan:    Walker Kehr, MD

## 2020-04-24 NOTE — Assessment & Plan Note (Signed)
We discussed age appropriate health related issues, including available/recomended screening tests and vaccinations. We discussed a need for adhering to healthy diet and exercise. Labs were ordered to be later reviewed . All questions were answered.   

## 2020-04-24 NOTE — Addendum Note (Signed)
Addended by: Cresenciano Lick on: 04/24/2020 08:48 AM   Modules accepted: Orders

## 2020-04-24 NOTE — Addendum Note (Signed)
Addended by: Shirlyn Goltz on: 04/24/2020 08:47 AM   Modules accepted: Orders

## 2020-04-28 ENCOUNTER — Other Ambulatory Visit: Payer: Self-pay | Admitting: Internal Medicine

## 2020-04-28 DIAGNOSIS — R739 Hyperglycemia, unspecified: Secondary | ICD-10-CM

## 2020-06-26 ENCOUNTER — Other Ambulatory Visit: Payer: Self-pay | Admitting: Medical

## 2020-06-30 NOTE — Telephone Encounter (Signed)
Pt's age 75, wt 87.5 kg, SCr 1.00, CrCl 81.42, last ov w/ KK 09/03/19.

## 2020-08-26 ENCOUNTER — Other Ambulatory Visit: Payer: Self-pay

## 2020-08-26 ENCOUNTER — Other Ambulatory Visit (INDEPENDENT_AMBULATORY_CARE_PROVIDER_SITE_OTHER): Payer: Medicare Other

## 2020-08-26 DIAGNOSIS — R739 Hyperglycemia, unspecified: Secondary | ICD-10-CM

## 2020-08-26 LAB — BASIC METABOLIC PANEL
BUN: 19 mg/dL (ref 6–23)
CO2: 34 mEq/L — ABNORMAL HIGH (ref 19–32)
Calcium: 9.6 mg/dL (ref 8.4–10.5)
Chloride: 100 mEq/L (ref 96–112)
Creatinine, Ser: 0.94 mg/dL (ref 0.40–1.50)
GFR: 80.25 mL/min (ref >60.00)
Glucose, Bld: 118 mg/dL — ABNORMAL HIGH (ref 70–99)
Potassium: 4.2 mEq/L (ref 3.5–5.1)
Sodium: 140 mEq/L (ref 135–145)

## 2020-08-26 LAB — HEMOGLOBIN A1C: Hgb A1c MFr Bld: 6.8 % — ABNORMAL HIGH (ref 4.6–6.5)

## 2020-09-24 ENCOUNTER — Other Ambulatory Visit: Payer: Self-pay | Admitting: Medical

## 2020-09-25 NOTE — Telephone Encounter (Signed)
This is Dr. Croitoru's pt 

## 2020-10-08 ENCOUNTER — Other Ambulatory Visit: Payer: Self-pay | Admitting: Internal Medicine

## 2020-10-23 ENCOUNTER — Encounter: Payer: Self-pay | Admitting: Internal Medicine

## 2020-10-23 ENCOUNTER — Ambulatory Visit (INDEPENDENT_AMBULATORY_CARE_PROVIDER_SITE_OTHER): Payer: Medicare Other | Admitting: Internal Medicine

## 2020-10-23 ENCOUNTER — Other Ambulatory Visit: Payer: Self-pay

## 2020-10-23 VITALS — BP 130/82 | HR 63 | Temp 97.9°F | Ht 66.0 in | Wt 194.2 lb

## 2020-10-23 DIAGNOSIS — Z23 Encounter for immunization: Secondary | ICD-10-CM | POA: Diagnosis not present

## 2020-10-23 DIAGNOSIS — I10 Essential (primary) hypertension: Secondary | ICD-10-CM | POA: Diagnosis not present

## 2020-10-23 DIAGNOSIS — N481 Balanitis: Secondary | ICD-10-CM | POA: Diagnosis not present

## 2020-10-23 DIAGNOSIS — I480 Paroxysmal atrial fibrillation: Secondary | ICD-10-CM | POA: Diagnosis not present

## 2020-10-23 DIAGNOSIS — R739 Hyperglycemia, unspecified: Secondary | ICD-10-CM | POA: Diagnosis not present

## 2020-10-23 MED ORDER — CLOTRIMAZOLE-BETAMETHASONE 1-0.05 % EX CREA: TOPICAL_CREAM | CUTANEOUS | 1 refills | Status: AC

## 2020-10-23 NOTE — Progress Notes (Signed)
Subjective:  Patient ID: William [name]., male    DOB: January 13, 1946  Age: 75 y.o. MRN: [id number]  CC: Follow-up (6 month f/u- Req written rx for Lotrisone cream)   HPI William [name]. presents for CAD, dyslipidemia, anticoagulation  Outpatient Medications Prior to Visit  Medication Sig Dispense Refill  . atorvastatin (LIPITOR) 80 MG tablet TAKE 1 TABLET BY MOUTH  DAILY 90 tablet 3  . Cholecalciferol (VITAMIN D3) 50 MCG (2000 UT) capsule Take 1 capsule (2,000 Units total) by mouth daily. 100 capsule 3  . clotrimazole-betamethasone (LOTRISONE) cream APPLY TO AFFECTED AREA EVERY DAY AS NEEDED    . ELIQUIS 5 MG TABS tablet TAKE 1 TABLET BY MOUTH  TWICE DAILY 180 tablet 1  . ezetimibe (ZETIA) 10 MG tablet Take 1 tablet (10 mg total) by mouth daily. 90 tablet 3  . potassium chloride SA (KLOR-CON) 20 MEQ tablet TAKE 1 TABLET BY MOUTH  DAILY 90 tablet 1  . triamcinolone cream (KENALOG) 0.1 % APPLY TO AFFECTED AREA TWICE A DAY    . valsartan-hydrochlorothiazide (DIOVAN-HCT) 160-25 MG tablet TAKE 1 TABLET BY MOUTH  DAILY 90 tablet 3  . vardenafil (LEVITRA) 20 MG tablet Take 0.5-1 tablets (10-20 mg total) by mouth daily as needed. 30 tablet 3  . 0.9 %  sodium chloride infusion      No facility-administered medications prior to visit.    ROS: Review of Systems  Constitutional: Negative for appetite change, fatigue and unexpected weight change.  HENT: Negative for congestion, nosebleeds, sneezing, sore throat and trouble swallowing.   Eyes: Negative for itching and visual disturbance.  Respiratory: Negative for cough.   Cardiovascular: Negative for chest pain, palpitations and leg swelling.  Gastrointestinal: Negative for abdominal distention, blood in stool, diarrhea and nausea.  Genitourinary: Negative for frequency and hematuria.  Musculoskeletal: Negative for back pain, gait problem, joint swelling and neck pain.  Skin: Negative for rash.  Neurological: Negative for dizziness,  tremors, speech difficulty and weakness.  Psychiatric/Behavioral: Negative for agitation, dysphoric mood and sleep disturbance. The patient is not nervous/anxious.     Objective:  BP 130/82 (BP Location: Left Arm)   Pulse 63   Temp 97.9 F (36.6 C) (Oral)   Ht 5\' 6"  (1.676 m)   Wt 194 lb 3.2 oz (88.1 kg)   SpO2 96%   BMI 31.34 kg/m   BP Readings from Last 3 Encounters:  10/23/20 130/82  04/24/20 128/70  09/03/19 127/76    Wt Readings from Last 3 Encounters:  10/23/20 194 lb 3.2 oz (88.1 kg)  04/24/20 193 lb (87.5 kg)  09/03/19 190 lb (86.2 kg)    Physical Exam Constitutional:      General: He is not in acute distress.    Appearance: He is well-developed.     Comments: NAD  Eyes:     Conjunctiva/sclera: Conjunctivae normal.     Pupils: Pupils are equal, round, and reactive to light.  Neck:     Thyroid: No thyromegaly.     Vascular: No JVD.  Cardiovascular:     Rate and Rhythm: Normal rate and regular rhythm.     Heart sounds: Normal heart sounds. No murmur heard. No friction rub. No gallop.   Pulmonary:     Effort: Pulmonary effort is normal. No respiratory distress.     Breath sounds: Normal breath sounds. No wheezing or rales.  Chest:     Chest wall: No tenderness.  Abdominal:     General: Bowel sounds are  normal. There is no distension.     Palpations: Abdomen is soft. There is no mass.     Tenderness: There is no abdominal tenderness. There is no guarding or rebound.  Musculoskeletal:        General: No tenderness. Normal range of motion.     Cervical back: Normal range of motion.  Lymphadenopathy:     Cervical: No cervical adenopathy.  Skin:    General: Skin is warm and dry.     Findings: No rash.  Neurological:     Mental Status: He is alert and oriented to person, place, and time.     Cranial Nerves: No cranial nerve deficit.     Motor: No abnormal muscle tone.     Coordination: Coordination normal.     Gait: Gait normal.     Deep Tendon  Reflexes: Reflexes are normal and symmetric.  Psychiatric:        Behavior: Behavior normal.        Thought Content: Thought content normal.        Judgment: Judgment normal.     Lab Results  Component Value Date   WBC 5.5 04/24/2020   HGB 15.5 04/24/2020   HCT 44.4 04/24/2020   PLT 160.0 04/24/2020   GLUCOSE 118 (H) 08/26/2020   CHOL 134 04/24/2020   TRIG 92.0 04/24/2020   HDL 55.40 04/24/2020   LDLCALC 60 04/24/2020   ALT 46 04/24/2020   AST 31 04/24/2020   NA 140 08/26/2020   K 4.2 08/26/2020   CL 100 08/26/2020   CREATININE 0.94 08/26/2020   BUN 19 08/26/2020   CO2 34 (H) 08/26/2020   TSH 2.37 04/24/2020   PSA 2.04 04/24/2020   HGBA1C 6.8 (H) 08/26/2020    CT CORONARY MORPH W/CTA COR W/SCORE W/CA W/CM &/OR WO/CM  Addendum Date: 10/24/2017   ADDENDUM REPORT: 10/24/2017 17:04 CLINICAL DATA:  71 -year-old male with PAF and abnormal exercise treadmill stress test. EXAM: Cardiac/Coronary  CT TECHNIQUE: The patient was scanned on a [employer]. FINDINGS: A 120 kV prospective scan was triggered in the descending thoracic aorta at 111 HU's. Axial non-contrast 3 mm slices were carried out through the heart. The data set was analyzed on a dedicated work station and scored using the [place]. Gantry rotation speed was 250 msecs and collimation was .6 mm. 5 mg of iv Metoprolol and 0.8 mg of sl NTG was given. The 3D data set was reconstructed in 5% intervals of the 67-82 % of the R-R cycle. Diastolic phases were analyzed on a dedicated work station using MPR, MIP and VRT modes. The patient received 80 cc of contrast. Aorta: Normal size. Mild calcifications in the descending aorta. No dissection. Aortic Valve:  Trileaflet.  No calcifications. Coronary Arteries:  Normal coronary origin.  Right dominance. RCA is a large dominant artery that gives rise to PDA and PLVB. There is minimal calcified plaque with associated stenosis 0-25%. Left main is a large artery that gives rise  to LAD and LCX arteries. Distal left main has a mild calcified plaque with stenosis 0-25%. LAD is a large vessel that gives rise to two small diagonal arteries. There is mild mixed diffuse plaque with stenosis 0-25%. 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 LCX is a medium size non-dominant artery. Ostial LCX artery has mild calcified plaque with associated stenosis 25-50%. Mid LCX artery has mixed plaque with stenosis 50-69%. Other findings: Normal pulmonary vein drainage into the left atrium. Normal  let atrial appendage without a thrombus. Normal size of the pulmonary artery. IMPRESSION: 1. Coronary calcium score of 262. This was 51 percentile for age and sex matched control. 2. Normal coronary origin with right dominance. 3. 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 Mid LCX artery has mixed plaque with stenosis 50-69%. Additional analysis with CT FFR will be submitted. 4. 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 Electronically Signed   By: Ena Dawley   On: 10/24/2017 17:04   Result Date: 10/24/2017 EXAM: OVER-READ INTERPRETATION  CT CHEST The following report is an over-read performed by radiologist Dr. Rolm Baptise of [facility], [place] on 10/24/2017. This over-read does not include interpretation of cardiac or coronary anatomy or pathology. The coronary CTA interpretation by the cardiologist is attached. COMPARISON:  None. FINDINGS: Vascular: Heart is normal size.  Visualized aorta normal caliber. Mediastinum/Nodes: No adenopathy in the lower mediastinum or hila. Lungs/Pleura: Dependent atelectasis in the lower lobes. No effusions. Upper Abdomen: Imaging into the upper abdomen shows no acute findings. Musculoskeletal: Chest wall soft tissues are unremarkable. No acute bony abnormality. IMPRESSION: No acute or significant extracardiac abnormality. Electronically Signed: By: Rolm Baptise M.D. On: 10/24/2017 14:11    Assessment &  Plan:   Follow-up: No follow-ups on file.  Walker Kehr, MD

## 2020-10-23 NOTE — Assessment & Plan Note (Signed)
Lotrisone 

## 2020-10-23 NOTE — Assessment & Plan Note (Signed)
On Eliquis

## 2020-10-23 NOTE — Assessment & Plan Note (Signed)
Cont w/Diovan-HCT

## 2020-10-23 NOTE — Addendum Note (Signed)
Addended by: Earnstine Regal on: 10/23/2020 09:10 AM   Modules accepted: Orders

## 2020-12-10 ENCOUNTER — Other Ambulatory Visit: Payer: Self-pay | Admitting: Medical

## 2020-12-11 NOTE — Telephone Encounter (Signed)
Patient overdue for visit.  Please call to schedule   Prescription refill request for Eliquis received. Indication: a fib Last office visit: Overdue Scr: 0.94 Age:  75  Weight: 88kg

## 2021-01-14 ENCOUNTER — Other Ambulatory Visit: Payer: Self-pay | Admitting: Internal Medicine

## 2021-01-15 ENCOUNTER — Other Ambulatory Visit: Payer: Self-pay | Admitting: Internal Medicine

## 2021-01-16 ENCOUNTER — Other Ambulatory Visit: Payer: Self-pay | Admitting: Internal Medicine

## 2021-01-20 NOTE — Progress Notes (Signed)
Cardiology Office Note:    Date:  01/21/2021   ID:  William [name]., DOB 02-07-1946, MRN [id number]  PCP:  Cassandria Anger, MD Referring MD: Cassandria Anger, MD    [place]  Cardiologist:  Sanda Klein, MD   Reason for visit: 1 year follow-up  History of Present Illness:    William [name]. is a 75 y.o. 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   His last ischemic evaluation was a coronary CTA in 2019 to evaluate EKG changes on a treadmill stress test which showed calcium score of 262 placing him in the 61st percentile for age/sex, with moderate (50-69%) stenosis in the mLCx with an incidental finding of a small distal LAD intramyocardial bridge. Echo 2016 showed EF 65-70%, no RWMA, G1DD, an no significant valvular abnormalities.  Today, he is doing well from a cardiovascular standpoint.  He states he gets around 2-3 episodes of A. fib per year.  These episodes occur mostly after excessive exercise.  He checks his smart watch which will confirm A. fib.  Symptoms usually resolve within 1 hour.  He states he keeps active on his [place].  He recently built a pier on his pond.  He is currently redoing attic space and has had no chest pain or shortness of breath on exertion.  He has recently seen his primary care about hyperglycemia.  His last A1c was 6.8%.  The patient is making dietary changes including decreased Pepsi intake and ice cream.  His blood pressure was elevated today.  Patient mentions he did not take his blood pressure medication yesterday since he was traveling to [place].  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   Past Medical History:  Diagnosis Date   Atrial fibrillation [facility])    Colon polyps    Dr Sharlett Iles   GERD (gastroesophageal  reflux disease)    HTN (hypertension)    Hyperlipidemia    OSA on CPAP    Prostatitis    Sleep apnea     Past Surgical History:  Procedure Laterality Date   APPENDECTOMY  2012   09/2010 in [place]   HERNIA REPAIR  1975   ROTATOR CUFF REPAIR  2008   left; Dr Para March    Current Medications: Current Meds  Medication Sig   atorvastatin (LIPITOR) 80 MG tablet TAKE 1 TABLET BY MOUTH  DAILY   Cholecalciferol (VITAMIN D3) 50 MCG (2000 UT) capsule Take 1 capsule (2,000 Units total) by mouth daily.   clotrimazole-betamethasone (LOTRISONE) cream APPLY TO AFFECTED AREA EVERY DAY AS NEEDED   ELIQUIS 5 MG TABS tablet TAKE 1 TABLET BY MOUTH  TWICE DAILY   ezetimibe (ZETIA) 10 MG tablet Take 1 tablet (10 mg total) by mouth daily. Annual appt due in Oct must see provider for future refills   potassium chloride SA (KLOR-CON) 20 MEQ tablet TAKE 1 TABLET BY MOUTH  DAILY   triamcinolone cream (KENALOG) 0.1 % APPLY TO AFFECTED AREA TWICE A DAY   valsartan-hydrochlorothiazide (DIOVAN-HCT) 160-25 MG tablet Take 1 tablet by mouth daily. Annual appt due in Oct must see provider for future refills   vardenafil (LEVITRA) 20 MG tablet Take 0.5-1 tablets (10-20 mg total) by mouth daily as needed.     Allergies:   Benazepril  hcl   Social History   Socioeconomic History   Marital status: Married    Spouse name: Not on file   Number of children: 3   Years of education: Not on file   Highest education level: Not on file  Occupational History   Occupation: Accountant/Retired  Tobacco Use   Smoking status: Never   Smokeless tobacco: Never  Vaping Use   Vaping Use: Never used  Substance and Sexual Activity   Alcohol use: No   Drug use: No   Sexual activity: Yes  Other Topics Concern   Not on file  Social History Narrative   Regular Exercise - Yes, gym            Social Determinants of Health   Financial Resource Strain: Low Risk    Difficulty of Paying Living Expenses: Not hard at all  Food  Insecurity: No Food Insecurity   Worried About Charity fundraiser in the Last Year: Never true   [place] in the Last Year: Never true  Transportation Needs: No Transportation Needs   Lack of Transportation (Medical): No   Lack of Transportation (Non-Medical): No  Physical Activity: Sufficiently Active   Days of Exercise per Week: 5 days   Minutes of Exercise per Session: 30 min  Stress: No Stress Concern Present   Feeling of Stress : Not at all  Social Connections: Socially Integrated   Frequency of Communication with Friends and Family: More than three times a week   Frequency of Social Gatherings with Friends and Family: More than three times a week   Attends Religious Services: More than 4 times per year   Active Member of [employer] or Organizations: Yes   Attends Music therapist: More than 4 times per year   Marital Status: Married     Family History: The patient's family history includes Asthma in his son; Coronary artery disease in his mother; Heart disease (age of onset: 63) in his father and mother; Hypertension in an other family member; Stroke in his sister. There is no history of Colon cancer, Stomach cancer, Rectal cancer, or Esophageal cancer.  ROS:   Please see the history of present illness.     EKGs/Labs/Other Studies Reviewed:    EKG:  The ekg ordered today demonstrates normal sinus rhythm, nonspecific ST-T wave abnormality, unchanged from prior, heart rate 82, QRS 96 ms, QT 364 ms.  Recent Labs: 04/24/2020: ALT 46; Hemoglobin 15.5; Platelets 160.0; TSH 2.37 08/26/2020: BUN 19; Creatinine, Ser 0.94; Potassium 4.2; Sodium 140  Recent Lipid Panel    Component Value Date/Time   CHOL 134 04/24/2020 0848   CHOL 146 11/09/2019 0959   TRIG 92.0 04/24/2020 0848   TRIG 67 05/03/2006 0753   HDL 55.40 04/24/2020 0848   HDL 55 11/09/2019 0959   CHOLHDL 2 04/24/2020 0848   VLDL 18.4 04/24/2020 0848   LDLCALC 60 04/24/2020 0848   LDLCALC 73 11/09/2019  0959    Physical Exam:    VS:  BP (!) 158/82   Pulse 82   Ht '5\' 6"'$  (1.676 m)   Wt 192 lb 12.8 oz (87.5 kg)   SpO2 95%   BMI 31.12 kg/m     Wt Readings from Last 3 Encounters:  01/21/21 192 lb 12.8 oz (87.5 kg)  10/23/20 194 lb 3.2 oz (88.1 kg)  04/24/20 193 lb (87.5 kg)     GEN:  Well nourished, well developed in no acute distress HEENT: Normal NECK: No  JVD; No carotid bruits CARDIAC: RRR, no murmurs, rubs, gallops RESPIRATORY:  Clear to auscultation without rales, wheezing or rhonchi  ABDOMEN: Soft, non-tender, non-distended MUSCULOSKELETAL: No edema; No deformity  SKIN: Warm and dry NEUROLOGIC:  Alert and oriented PSYCHIATRIC:  Normal affect   ASSESSMENT AND PLAN   Paroxysmal atrial fibrillation, rare episodes - Continue eliquis for stroke prophylaxis   Coronary artery disease, no angina - moderate LCx disease noted on coronary CTA in 2019 - Not on aspirin given need for anticoagulation - Discussed signs/symptoms of ischemia.   HTN - BP elevated today after not taking his blood pressure medicine yesterday.  He states his systolic blood pressure is usually in the 120s and occasionally in the 130s. - I have given him a blood pressure log.  He will call me if his systolic blood pressure is persistently over 140.  He has room to titrate his Diovan or add amlodipine if needed. - Goal BP is <130/80.  Recommend DASH diet (high in vegetables, fruits, low-fat dairy products, whole grains, poultry, fish, and nuts and low in sweets, sugar-sweetened beverages, and red meats), salt restriction and increase physical activity.   HLD -  LDL 60 03/2020.   - Continue current medications.  He has his lipids checked with his primary care annually. - Discussed cholesterol lowering diets - Mediterranean diet, DASH diet, vegetarian diet, low-carbohydrate diet and avoidance of trans fats.  Discussed healthier choice substitutes.  Nuts, high-fiber foods, and fiber supplements may also  improve lipids.    Obesity - Discussed how even a 5-10% weight loss can have cardiovascular benefits.   - Recommend moderate intensity activity for 30 minutes 5 days/week and the DASH diet (which he is already doing).  Disposition - Follow-up in Dr. Sallyanne Kuster or myself in 1 year.     Medication Adjustments/Labs and Tests Ordered: Current medicines are reviewed at length with the patient today.  Concerns regarding medicines are outlined above.  Orders Placed This Encounter  Procedures   EKG 12-Lead   No orders of the defined types were placed in this encounter.   Patient Instructions  Medication Instructions:  No Changes *If you need a refill on your cardiac medications before your next appointment, please call your pharmacy*   Lab Work: No Labs If you have labs (blood work) drawn today and your tests are completely normal, you will receive your results only by: [place] (if you have MyChart) OR A paper copy in the mail If you have any lab test that is abnormal or we need to change your treatment, we will call you to review the results.   Testing/Procedures: No Testing   Follow-Up: At [facility], you and your health needs are our priority.  As part of our continuing mission to provide you with exceptional heart care, we have created designated Provider Care Teams.  These Care Teams include your primary Cardiologist (physician) and Advanced Practice Providers (APPs -  Physician Assistants and Nurse Practitioners) who all work together to provide you with the care you need, when you need it.  We recommend signing up for the patient portal called "MyChart".  Sign up information is provided on this After Visit Summary.  MyChart is used to connect with patients for Virtual Visits (Telemedicine).  Patients are able to view lab/test results, encounter notes, upcoming appointments, etc.  Non-urgent messages can be sent to your provider as well.   To learn more about what you  can do with MyChart, go to [email or web address].  Your next appointment:   1 year(s)  The format for your next appointment:   In Person  Provider:   You may see Sanda Klein, MD or one of the following Advanced Practice Providers on your designated Care Team:   Almyra Deforest, PA-C Fabian Sharp, PA-C or  Roby Lofts, PA-C  Caron Presume, [place]  Other Instructions Please call office if Systolic Blood Pressure over 140.   Signed, Warren Lacy, PA-C  01/21/2021 10:31 AM    [facility]

## 2021-01-21 ENCOUNTER — Ambulatory Visit (INDEPENDENT_AMBULATORY_CARE_PROVIDER_SITE_OTHER): Payer: Medicare Other | Admitting: Physician Assistant

## 2021-01-21 ENCOUNTER — Other Ambulatory Visit: Payer: Self-pay

## 2021-01-21 ENCOUNTER — Encounter: Payer: Self-pay | Admitting: Physician Assistant

## 2021-01-21 DIAGNOSIS — E785 Hyperlipidemia, unspecified: Secondary | ICD-10-CM

## 2021-01-21 DIAGNOSIS — I10 Essential (primary) hypertension: Secondary | ICD-10-CM

## 2021-01-21 DIAGNOSIS — I480 Paroxysmal atrial fibrillation: Secondary | ICD-10-CM

## 2021-01-21 DIAGNOSIS — I2510 Atherosclerotic heart disease of native coronary artery without angina pectoris: Secondary | ICD-10-CM | POA: Diagnosis not present

## 2021-01-21 NOTE — Patient Instructions (Signed)
Medication Instructions:  No Changes *If you need a refill on your cardiac medications before your next appointment, please call your pharmacy*   Lab Work: No Labs If you have labs (blood work) drawn today and your tests are completely normal, you will receive your results only by: [place] (if you have MyChart) OR A paper copy in the mail If you have any lab test that is abnormal or we need to change your treatment, we will call you to review the results.   Testing/Procedures: No Testing   Follow-Up: At [facility], you and your health needs are our priority.  As part of our continuing mission to provide you with exceptional heart care, we have created designated Provider Care Teams.  These Care Teams include your primary Cardiologist (physician) and Advanced Practice Providers (APPs -  Physician Assistants and Nurse Practitioners) who all work together to provide you with the care you need, when you need it.  We recommend signing up for the patient portal called "MyChart".  Sign up information is provided on this After Visit Summary.  MyChart is used to connect with patients for Virtual Visits (Telemedicine).  Patients are able to view lab/test results, encounter notes, upcoming appointments, etc.  Non-urgent messages can be sent to your provider as well.   To learn more about what you can do with MyChart, go to [email or web address].    Your next appointment:   1 year(s)  The format for your next appointment:   In Person  Provider:   You may see Sanda Klein, MD or one of the following Advanced Practice Providers on your designated Care Team:   Almyra Deforest, PA-C Fabian Sharp, PA-C or  Roby Lofts, PA-C  Caron Presume, [place]  Other Instructions Please call office if Systolic Blood Pressure over 140.

## 2021-03-24 ENCOUNTER — Other Ambulatory Visit: Payer: Self-pay | Admitting: Internal Medicine

## 2021-04-02 ENCOUNTER — Other Ambulatory Visit: Payer: Self-pay | Admitting: Medical

## 2021-04-02 NOTE — Telephone Encounter (Signed)
This is Dr. Croitoru's pt 

## 2021-04-02 NOTE — Telephone Encounter (Signed)
Prescription refill request for Eliquis received. Indication:atrial fib Last office visit:7/22 Scr:0.9 Age: 75 Weight:87.5 kg  Prescription refilled

## 2021-04-06 ENCOUNTER — Ambulatory Visit: Payer: Medicare Other | Admitting: Pulmonary Disease

## 2021-04-22 ENCOUNTER — Telehealth: Payer: Self-pay | Admitting: *Deleted

## 2021-04-22 NOTE — Telephone Encounter (Signed)
Patient scheduled 04/29/21 at 2pm with Dr. Halford Chessman for sleep consult.  Patient last seen in office 11/25/16. Patient sleep study 10/04/07 in epic. Patient has updated download in [place].

## 2021-04-28 ENCOUNTER — Ambulatory Visit (INDEPENDENT_AMBULATORY_CARE_PROVIDER_SITE_OTHER): Payer: Medicare Other | Admitting: Internal Medicine

## 2021-04-28 ENCOUNTER — Other Ambulatory Visit: Payer: Self-pay

## 2021-04-28 ENCOUNTER — Encounter: Payer: Self-pay | Admitting: Internal Medicine

## 2021-04-28 VITALS — BP 122/80 | HR 67 | Temp 98.6°F | Ht 66.0 in | Wt 189.6 lb

## 2021-04-28 DIAGNOSIS — R310 Gross hematuria: Secondary | ICD-10-CM | POA: Diagnosis not present

## 2021-04-28 DIAGNOSIS — Z0000 Encounter for general adult medical examination without abnormal findings: Secondary | ICD-10-CM

## 2021-04-28 DIAGNOSIS — I10 Essential (primary) hypertension: Secondary | ICD-10-CM | POA: Diagnosis not present

## 2021-04-28 DIAGNOSIS — R972 Elevated prostate specific antigen [PSA]: Secondary | ICD-10-CM | POA: Diagnosis not present

## 2021-04-28 DIAGNOSIS — Z125 Encounter for screening for malignant neoplasm of prostate: Secondary | ICD-10-CM | POA: Diagnosis not present

## 2021-04-28 LAB — LIPID PANEL
Cholesterol: 140 mg/dL (ref 0–200)
HDL: 52.7 mg/dL (ref >39.00)
LDL Cholesterol: 63 mg/dL (ref 0–99)
NonHDL: 87.54
Total CHOL/HDL Ratio: 3
Triglycerides: 122 mg/dL (ref 0.0–149.0)
VLDL: 24.4 mg/dL (ref 0.0–40.0)

## 2021-04-28 LAB — URINALYSIS, ROUTINE W REFLEX MICROSCOPIC
Bilirubin Urine: NEGATIVE
Ketones, ur: NEGATIVE
Leukocytes,Ua: NEGATIVE
Nitrite: NEGATIVE
Specific Gravity, Urine: 1.025 (ref 1.000–1.030)
Urine Glucose: NEGATIVE
Urobilinogen, UA: 1 (ref 0.0–1.0)
pH: 6 (ref 5.0–8.0)

## 2021-04-28 LAB — CBC WITH DIFFERENTIAL/PLATELET
Basophils Absolute: 0 10*3/uL (ref 0.0–0.1)
Basophils Relative: 0.7 % (ref 0.0–3.0)
Eosinophils Absolute: 0.2 10*3/uL (ref 0.0–0.7)
Eosinophils Relative: 2.9 % (ref 0.0–5.0)
HCT: 44.2 % (ref 39.0–52.0)
Hemoglobin: 15.2 g/dL (ref 13.0–17.0)
Lymphocytes Relative: 22.1 % (ref 12.0–46.0)
Lymphs Abs: 1.4 10*3/uL (ref 0.7–4.0)
MCHC: 34.3 g/dL (ref 30.0–36.0)
MCV: 88.4 fl (ref 78.0–100.0)
Monocytes Absolute: 0.4 10*3/uL (ref 0.1–1.0)
Monocytes Relative: 7 % (ref 3.0–12.0)
Neutro Abs: 4.1 10*3/uL (ref 1.4–7.7)
Neutrophils Relative %: 67.3 % (ref 43.0–77.0)
Platelets: 185 10*3/uL (ref 150.0–400.0)
RBC: 5 Mil/uL (ref 4.22–5.81)
RDW: 14 % (ref 11.5–15.5)
WBC: 6.2 10*3/uL (ref 4.0–10.5)

## 2021-04-28 LAB — COMPREHENSIVE METABOLIC PANEL
ALT: 55 U/L — ABNORMAL HIGH (ref 0–53)
AST: 35 U/L (ref 0–37)
Albumin: 4.6 g/dL (ref 3.5–5.2)
Alkaline Phosphatase: 106 U/L (ref 39–117)
BUN: 19 mg/dL (ref 6–23)
CO2: 35 mEq/L — ABNORMAL HIGH (ref 19–32)
Calcium: 9.3 mg/dL (ref 8.4–10.5)
Chloride: 100 mEq/L (ref 96–112)
Creatinine, Ser: 1 mg/dL (ref 0.40–1.50)
GFR: 74.16 mL/min (ref >60.00)
Glucose, Bld: 142 mg/dL — ABNORMAL HIGH (ref 70–99)
Potassium: 4.1 mEq/L (ref 3.5–5.1)
Sodium: 142 mEq/L (ref 135–145)
Total Bilirubin: 0.9 mg/dL (ref 0.2–1.2)
Total Protein: 7.2 g/dL (ref 6.0–8.3)

## 2021-04-28 LAB — PSA: PSA: 2.3 ng/mL (ref 0.10–4.00)

## 2021-04-28 LAB — TSH: TSH: 2.58 u[IU]/mL (ref 0.35–5.50)

## 2021-04-28 NOTE — Assessment & Plan Note (Signed)
On Diovan HCT

## 2021-04-28 NOTE — Assessment & Plan Note (Signed)
Check PSA. ?

## 2021-04-28 NOTE — Assessment & Plan Note (Addendum)

## 2021-04-28 NOTE — Patient Instructions (Signed)
Get a tDAP

## 2021-04-28 NOTE — Progress Notes (Signed)
Subjective:  Patient ID: [name], male    DOB: 06-16-1946  Age: 75 y.o. MRN: [id number]  CC: Annual Exam   HPI Abrahim W Edvin Albus. presents for a well exam  Outpatient Medications Prior to Visit  Medication Sig Dispense Refill   atorvastatin (LIPITOR) 80 MG tablet TAKE 1 TABLET BY MOUTH  DAILY 90 tablet 3   Cholecalciferol (VITAMIN D3) 50 MCG (2000 UT) capsule Take 1 capsule (2,000 Units total) by mouth daily. 100 capsule 3   clotrimazole-betamethasone (LOTRISONE) cream APPLY TO AFFECTED AREA EVERY DAY AS NEEDED 45 g 1   ELIQUIS 5 MG TABS tablet TAKE 1 TABLET BY MOUTH  TWICE DAILY 180 tablet 3   ezetimibe (ZETIA) 10 MG tablet Take 1 tablet (10 mg total) by mouth daily. Annual appt due in Oct must see provider for future refills 90 tablet 0   potassium chloride SA (KLOR-CON) 20 MEQ tablet TAKE 1 TABLET BY MOUTH  DAILY 90 tablet 3   triamcinolone cream (KENALOG) 0.1 % APPLY TO AFFECTED AREA TWICE A DAY     valsartan-hydrochlorothiazide (DIOVAN-HCT) 160-25 MG tablet Take 1 tablet by mouth daily. Annual appt due in Oct must see provider for future refills 90 tablet 0   vardenafil (LEVITRA) 20 MG tablet Take 0.5-1 tablets (10-20 mg total) by mouth daily as needed. 30 tablet 3   No facility-administered medications prior to visit.    ROS: Review of Systems  Constitutional:  Negative for appetite change, fatigue and unexpected weight change.  HENT:  Negative for congestion, nosebleeds, sneezing, sore throat and trouble swallowing.   Eyes:  Negative for itching and visual disturbance.  Respiratory:  Negative for cough.   Cardiovascular:  Positive for palpitations. Negative for chest pain and leg swelling.  Gastrointestinal:  Negative for abdominal distention, blood in stool, diarrhea and nausea.  Genitourinary:  Negative for frequency and hematuria.  Musculoskeletal:  Negative for back pain, gait problem, joint swelling and neck pain.  Skin:  Negative for rash.  Neurological:   Negative for dizziness, tremors, speech difficulty and weakness.  Psychiatric/Behavioral:  Negative for agitation, dysphoric mood and sleep disturbance. The patient is not nervous/anxious.    Objective:  BP 122/80 (BP Location: Left Arm)   Pulse 67   Temp 98.6 F (37 C) (Oral)   Ht 5\' 6"  (1.676 m)   Wt 189 lb 9.6 oz (86 kg)   SpO2 95%   BMI 30.60 kg/m   BP Readings from Last 3 Encounters:  04/28/21 122/80  01/21/21 (!) 158/82  10/23/20 130/82    Wt Readings from Last 3 Encounters:  04/28/21 189 lb 9.6 oz (86 kg)  01/21/21 192 lb 12.8 oz (87.5 kg)  10/23/20 194 lb 3.2 oz (88.1 kg)    Physical Exam Constitutional:      General: He is not in acute distress.    Appearance: He is well-developed.     Comments: NAD  Eyes:     Conjunctiva/sclera: Conjunctivae normal.     Pupils: Pupils are equal, round, and reactive to light.  Neck:     Thyroid: No thyromegaly.     Vascular: No JVD.  Cardiovascular:     Rate and Rhythm: Normal rate and regular rhythm.     Heart sounds: Normal heart sounds. No murmur heard.   No friction rub. No gallop.  Pulmonary:     Effort: Pulmonary effort is normal. No respiratory distress.     Breath sounds: Normal breath sounds. No wheezing or  rales.  Chest:     Chest wall: No tenderness.  Abdominal:     General: Bowel sounds are normal. There is no distension.     Palpations: Abdomen is soft. There is no mass.     Tenderness: There is no abdominal tenderness. There is no guarding or rebound.  Genitourinary:    Rectum: Normal. Guaiac result positive.  Musculoskeletal:        General: No tenderness. Normal range of motion.     Cervical back: Normal range of motion.  Lymphadenopathy:     Cervical: No cervical adenopathy.  Skin:    General: Skin is warm and dry.     Findings: No rash.  Neurological:     Mental Status: He is alert and oriented to person, place, and time.     Cranial Nerves: No cranial nerve deficit.     Motor: No abnormal  muscle tone.     Coordination: Coordination normal.     Gait: Gait normal.     Deep Tendon Reflexes: Reflexes are normal and symmetric.  Psychiatric:        Behavior: Behavior normal.        Thought Content: Thought content normal.        Judgment: Judgment normal.  Prostate 1+  Lab Results  Component Value Date   WBC 5.5 04/24/2020   HGB 15.5 04/24/2020   HCT 44.4 04/24/2020   PLT 160.0 04/24/2020   GLUCOSE 118 (H) 08/26/2020   CHOL 134 04/24/2020   TRIG 92.0 04/24/2020   HDL 55.40 04/24/2020   LDLCALC 60 04/24/2020   ALT 46 04/24/2020   AST 31 04/24/2020   NA 140 08/26/2020   K 4.2 08/26/2020   CL 100 08/26/2020   CREATININE 0.94 08/26/2020   BUN 19 08/26/2020   CO2 34 (H) 08/26/2020   TSH 2.37 04/24/2020   PSA 2.04 04/24/2020   HGBA1C 6.8 (H) 08/26/2020    CT CORONARY MORPH W/CTA COR W/SCORE W/CA W/CM &/OR WO/CM  Addendum Date: 10/24/2017   ADDENDUM REPORT: 10/24/2017 17:04 CLINICAL DATA:  63 -year-old male with PAF and abnormal exercise treadmill stress test. EXAM: Cardiac/Coronary  CT TECHNIQUE: The patient was scanned on a [employer]. FINDINGS: A 120 kV prospective scan was triggered in the descending thoracic aorta at 111 HU's. Axial non-contrast 3 mm slices were carried out through the heart. The data set was analyzed on a dedicated work station and scored using the [employer]. Gantry rotation speed was 250 msecs and collimation was .6 mm. 5 mg of iv Metoprolol and 0.8 mg of sl NTG was given. The 3D data set was reconstructed in 5% intervals of the 67-82 % of the R-R cycle. Diastolic phases were analyzed on a dedicated work station using MPR, MIP and VRT modes. The patient received 80 cc of contrast. Aorta: Normal size. Mild calcifications in the descending aorta. No dissection. Aortic Valve:  Trileaflet.  No calcifications. Coronary Arteries:  Normal coronary origin.  Right dominance. RCA is a large dominant artery that gives rise to PDA and PLVB. There is  minimal calcified plaque with associated stenosis 0-25%. Left main is a large artery that gives rise to LAD and LCX arteries. Distal left main has a mild calcified plaque with stenosis 0-25%. LAD is a large vessel that gives rise to two small diagonal arteries. There is mild mixed diffuse plaque with stenosis 0-25%. 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 LCX is a medium size non-dominant artery.  Ostial LCX artery has mild calcified plaque with associated stenosis 25-50%. Mid LCX artery has mixed plaque with stenosis 50-69%. Other findings: Normal pulmonary vein drainage into the left atrium. Normal let atrial appendage without a thrombus. Normal size of the pulmonary artery. IMPRESSION: 1. Coronary calcium score of 262. This was 56 percentile for age and sex matched control. 2. Normal coronary origin with right dominance. 3. 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 Mid LCX artery has mixed plaque with stenosis 50-69%. Additional analysis with CT FFR will be submitted. 4. 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 Electronically Signed   By: Ena Dawley   On: 10/24/2017 17:04   Result Date: 10/24/2017 EXAM: OVER-READ INTERPRETATION  CT CHEST The following report is an over-read performed by radiologist Dr. Rolm Baptise of [facility], [place] on 10/24/2017. This over-read does not include interpretation of cardiac or coronary anatomy or pathology. The coronary CTA interpretation by the cardiologist is attached. COMPARISON:  None. FINDINGS: Vascular: Heart is normal size.  Visualized aorta normal caliber. Mediastinum/Nodes: No adenopathy in the lower mediastinum or hila. Lungs/Pleura: Dependent atelectasis in the lower lobes. No effusions. Upper Abdomen: Imaging into the upper abdomen shows no acute findings. Musculoskeletal: Chest wall soft tissues are unremarkable. No acute bony abnormality. IMPRESSION: No acute or significant extracardiac  abnormality. Electronically Signed: By: Rolm Baptise M.D. On: 10/24/2017 14:11    Assessment & Plan:   Problem List Items Addressed This Visit     Essential hypertension    On Diovan HCT      PSA, INCREASED    Check PSA      Relevant Orders   PSA   Well adult exam - Primary     We discussed age appropriate health related issues, including available/recomended screening tests and vaccinations. Labs were ordered to be later reviewed . All questions were answered. We discussed one or more of the following - seat belt use, use of sunscreen/sun exposure exercise, fall risk reduction, second hand smoke exposure, firearm use and storage, seat belt use, a need for adhering to healthy diet and exercise. Labs were ordered.  All questions were answered. Get a tDAP       Relevant Orders   TSH   Urinalysis   CBC with Differential/Platelet   Lipid panel   PSA   Comprehensive metabolic panel      No orders of the defined types were placed in this encounter.     Follow-up: Return in about 6 months (around 10/26/2021) for a follow-up visit.  Walker Kehr, MD

## 2021-04-29 ENCOUNTER — Encounter: Payer: Self-pay | Admitting: Pulmonary Disease

## 2021-04-29 ENCOUNTER — Other Ambulatory Visit: Payer: Self-pay | Admitting: Internal Medicine

## 2021-04-29 ENCOUNTER — Ambulatory Visit (INDEPENDENT_AMBULATORY_CARE_PROVIDER_SITE_OTHER): Payer: Medicare Other | Admitting: Pulmonary Disease

## 2021-04-29 VITALS — BP 132/88 | HR 83 | Ht 66.0 in | Wt 191.4 lb

## 2021-04-29 DIAGNOSIS — R310 Gross hematuria: Secondary | ICD-10-CM

## 2021-04-29 DIAGNOSIS — Z9989 Dependence on other enabling machines and devices: Secondary | ICD-10-CM

## 2021-04-29 DIAGNOSIS — R319 Hematuria, unspecified: Secondary | ICD-10-CM | POA: Insufficient documentation

## 2021-04-29 DIAGNOSIS — G4733 Obstructive sleep apnea (adult) (pediatric): Secondary | ICD-10-CM

## 2021-04-29 NOTE — Assessment & Plan Note (Signed)
New.  Urology consultation

## 2021-04-29 NOTE — Patient Instructions (Signed)
Will have [employer] arrange for new CPAP machine  Follow up in 6 months

## 2021-04-29 NOTE — Progress Notes (Signed)
William [name], Critical Care, and Sleep Medicine  Chief Complaint  Patient presents with   Obstructive Sleep Apnea     Past Surgical History:  He  has a past surgical history that includes Rotator cuff repair (2008); Appendectomy (2012); and Hernia repair (1975).  Past Medical History:  GERD, HTN, HLD, Afib, Colon polyps  Constitutional:  BP 132/88   Pulse 83   Ht 5\' 6"  (1.676 m)   Wt 191 lb 6.4 oz (86.8 kg)   SpO2 96%   BMI 30.89 kg/m   Brief Summary:  William [name]. is a 75 y.o. male with obstructive sleep apnea      Subjective:   I last saw him in 2018.  He wasn't able to get follow up scheduled sooner due to [place] pandemic.  He uses CPAP nightly.  No issues with mask fit.  Machine is starting to make noise.   Physical Exam:   Appearance - well kempt   ENMT - no sinus tenderness, no oral exudate, no LAN, Mallampati 3 airway, no stridor  Respiratory - equal breath sounds bilaterally, no wheezing or rales  CV - s1s2 regular rate and rhythm, no murmurs  Ext - no clubbing, no edema  Skin - no rashes  Psych - normal mood and affect   Sleep Tests:  PSG 10/04/07 >> AHI 36  CPAP 03/29/21 to 04/27/21 >> used on 30 of 30 nights with average 8 hrs 12 min.  Average AHI 0.2 with CPAP 12 cm H2O   Cardiac Tests:  Echo 09/26/14 >> EF 65 to 70%, grade 1 DD  Social History:  He  reports that he has never smoked. He has never used smokeless tobacco. He reports that he does not drink alcohol and does not use drugs.  Family History:  His family history includes Asthma in his son; Coronary artery disease in his mother; Heart disease (age of onset: 24) in his father and mother; Hypertension in an other family member; Stroke in his sister.     Assessment/Plan:   Obstructive sleep apnea. - he is compliant with CPAP and reports benefit from therapy - he uses [employer] for his DME - his current device is more than 74 yrs old and needs to be replaced  - will  arrange for new CPAP at 12 cm H2O - explained his insurance might require repeat home sleep study before authorizing new machine  Time Spent Involved in Patient Care on Day of Examination:  23 minutes  Follow up:   Patient Instructions  Will have [employer] arrange for new CPAP machine  Follow up in 6 months  Medication List:   Allergies as of 04/29/2021       Reactions   Benazepril Hcl Other (See Comments)   REACTION: cough        Medication List        Accurate as of April 29, 2021  2:38 PM. If you have any questions, ask your nurse or doctor.          atorvastatin 80 MG tablet Commonly known as: LIPITOR TAKE 1 TABLET BY MOUTH  DAILY   clotrimazole-betamethasone cream Commonly known as: LOTRISONE APPLY TO AFFECTED AREA EVERY DAY AS NEEDED   Eliquis 5 MG Tabs tablet Generic drug: apixaban TAKE 1 TABLET BY MOUTH  TWICE DAILY   ezetimibe 10 MG tablet Commonly known as: ZETIA Take 1 tablet (10 mg total) by mouth daily. Annual appt due in Oct must see provider for future refills  potassium chloride SA 20 MEQ tablet Commonly known as: KLOR-CON TAKE 1 TABLET BY MOUTH  DAILY   triamcinolone cream 0.1 % Commonly known as: KENALOG APPLY TO AFFECTED AREA TWICE A DAY   valsartan-hydrochlorothiazide 160-25 MG tablet Commonly known as: DIOVAN-HCT Take 1 tablet by mouth daily. Annual appt due in Oct must see provider for future refills   vardenafil 20 MG tablet Commonly known as: LEVITRA Take 0.5-1 tablets (10-20 mg total) by mouth daily as needed.   Vitamin D3 50 MCG (2000 UT) capsule Take 1 capsule (2,000 Units total) by mouth daily.        Signature:  Chesley Mires, MD [place] Pager - [phone] 04/29/2021, 2:38 PM

## 2021-05-25 ENCOUNTER — Other Ambulatory Visit: Payer: Self-pay | Admitting: Internal Medicine

## 2021-05-27 ENCOUNTER — Telehealth: Payer: Self-pay | Admitting: Internal Medicine

## 2021-05-27 NOTE — Telephone Encounter (Signed)
Left message for patient to call me back at [phone] to schedule Medicare Annual Wellness Visit   Last AWV  04/23/20  Please schedule at anytime with [facility] if patient calls the office back.    40 Minutes appointment   Any questions, please call me at [phone]

## 2021-06-03 ENCOUNTER — Encounter: Payer: Self-pay | Admitting: Internal Medicine

## 2021-08-28 ENCOUNTER — Other Ambulatory Visit: Payer: Self-pay | Admitting: Medical

## 2021-10-22 ENCOUNTER — Telehealth: Payer: Self-pay | Admitting: Internal Medicine

## 2021-10-22 NOTE — Telephone Encounter (Signed)
LVM for pt to rtn my call to schedule AWV with [facility]. Please schedule this appt if pt calls the office.  °

## 2021-10-27 ENCOUNTER — Ambulatory Visit (INDEPENDENT_AMBULATORY_CARE_PROVIDER_SITE_OTHER): Payer: Medicare Other | Admitting: Internal Medicine

## 2021-10-27 ENCOUNTER — Encounter: Payer: Self-pay | Admitting: Internal Medicine

## 2021-10-27 VITALS — BP 118/68 | HR 86 | Temp 98.0°F | Ht 66.0 in | Wt 194.0 lb

## 2021-10-27 DIAGNOSIS — R739 Hyperglycemia, unspecified: Secondary | ICD-10-CM

## 2021-10-27 DIAGNOSIS — E785 Hyperlipidemia, unspecified: Secondary | ICD-10-CM | POA: Diagnosis not present

## 2021-10-27 DIAGNOSIS — M79609 Pain in unspecified limb: Secondary | ICD-10-CM

## 2021-10-27 DIAGNOSIS — I480 Paroxysmal atrial fibrillation: Secondary | ICD-10-CM

## 2021-10-27 DIAGNOSIS — I10 Essential (primary) hypertension: Secondary | ICD-10-CM | POA: Diagnosis not present

## 2021-10-27 DIAGNOSIS — N200 Calculus of kidney: Secondary | ICD-10-CM | POA: Insufficient documentation

## 2021-10-27 LAB — COMPREHENSIVE METABOLIC PANEL
ALT: 46 U/L (ref 0–53)
AST: 30 U/L (ref 0–37)
Albumin: 4.5 g/dL (ref 3.5–5.2)
Alkaline Phosphatase: 92 U/L (ref 39–117)
BUN: 20 mg/dL (ref 6–23)
CO2: 33 mEq/L — ABNORMAL HIGH (ref 19–32)
Calcium: 9 mg/dL (ref 8.4–10.5)
Chloride: 100 mEq/L (ref 96–112)
Creatinine, Ser: 1.08 mg/dL (ref 0.40–1.50)
GFR: 67.38 mL/min (ref >60.00)
Glucose, Bld: 150 mg/dL — ABNORMAL HIGH (ref 70–99)
Potassium: 3.7 mEq/L (ref 3.5–5.1)
Sodium: 140 mEq/L (ref 135–145)
Total Bilirubin: 0.8 mg/dL (ref 0.2–1.2)
Total Protein: 7.1 g/dL (ref 6.0–8.3)

## 2021-10-27 LAB — HEMOGLOBIN A1C: Hgb A1c MFr Bld: 7.3 % — ABNORMAL HIGH (ref 4.6–6.5)

## 2021-10-27 LAB — LIPID PANEL
Cholesterol: 138 mg/dL (ref 0–200)
HDL: 58.4 mg/dL (ref >39.00)
LDL Cholesterol: 62 mg/dL (ref 0–99)
NonHDL: 79.73
Total CHOL/HDL Ratio: 2
Triglycerides: 89 mg/dL (ref 0.0–149.0)
VLDL: 17.8 mg/dL (ref 0.0–40.0)

## 2021-10-27 NOTE — Assessment & Plan Note (Signed)
On Eliquis

## 2021-10-27 NOTE — Patient Instructions (Signed)
Blue-Emu cream -- use 2-3 times a day ? ?

## 2021-10-27 NOTE — Progress Notes (Signed)
? ?Subjective:  ?Patient ID: William Raymond., male    DOB: 1946-01-13  Age: 76 y.o. MRN: [id number] ? ?CC: No chief complaint on file. ? ? ?HPI ?William [name]. presents for renal stones, dyslipidemia, HTN, A fib ? ?Outpatient Medications Prior to Visit  ?Medication Sig Dispense Refill  ? atorvastatin (LIPITOR) 80 MG tablet Take 1 tablet (80 mg total) by mouth daily. PATIENT NEED TO MAKE AN APPOINTMENT FOR FUTURE REFILLS 90 tablet 0  ? Cholecalciferol (VITAMIN D3) 50 MCG (2000 UT) capsule Take 1 capsule (2,000 Units total) by mouth daily. 100 capsule 3  ? clotrimazole-betamethasone (LOTRISONE) cream APPLY TO AFFECTED AREA EVERY DAY AS NEEDED 45 g 1  ? ELIQUIS 5 MG TABS tablet TAKE 1 TABLET BY MOUTH  TWICE DAILY 180 tablet 3  ? ezetimibe (ZETIA) 10 MG tablet TAKE 1 TABLET BY MOUTH  DAILY 90 tablet 3  ? potassium chloride SA (KLOR-CON) 20 MEQ tablet TAKE 1 TABLET BY MOUTH  DAILY 90 tablet 3  ? tamsulosin (FLOMAX) 0.4 MG CAPS capsule Take 0.4 mg by mouth.    ? triamcinolone cream (KENALOG) 0.1 % APPLY TO AFFECTED AREA TWICE A DAY    ? valsartan-hydrochlorothiazide (DIOVAN-HCT) 160-25 MG tablet Take 1 tablet by mouth daily. Annual appt due in Oct must see provider for future refills 90 tablet 0  ? vardenafil (LEVITRA) 20 MG tablet Take 0.5-1 tablets (10-20 mg total) by mouth daily as needed. 30 tablet 3  ? ?No facility-administered medications prior to visit.  ? ? ?ROS: ?Review of Systems  ?Constitutional:  Positive for unexpected weight change. Negative for appetite change and fatigue.  ?HENT:  Negative for congestion, nosebleeds, sneezing, sore throat and trouble swallowing.   ?Eyes:  Negative for itching and visual disturbance.  ?Respiratory:  Negative for cough.   ?Cardiovascular:  Negative for chest pain, palpitations and leg swelling.  ?Gastrointestinal:  Negative for abdominal distention, blood in stool, diarrhea and nausea.  ?Genitourinary:  Negative for frequency and hematuria.  ?Musculoskeletal:  Negative  for back pain, gait problem, joint swelling and neck pain.  ?Skin:  Negative for rash.  ?Neurological:  Negative for dizziness, tremors, speech difficulty and weakness.  ?Psychiatric/Behavioral:  Negative for agitation, dysphoric mood and sleep disturbance. The patient is not nervous/anxious.   ? ?Objective:  ?BP 118/68 (BP Location: Left Arm, Patient Position: Sitting, Cuff Size: Normal)   Pulse 86   Temp 98 ?F (36.7 ?C) (Oral)   Ht '5\' 6"'$  (1.676 m)   Wt 194 lb (88 kg)   SpO2 93%   BMI 31.31 kg/m?  ? ?BP Readings from Last 3 Encounters:  ?10/27/21 118/68  ?04/29/21 132/88  ?04/28/21 122/80  ? ? ?Wt Readings from Last 3 Encounters:  ?10/27/21 194 lb (88 kg)  ?04/29/21 191 lb 6.4 oz (86.8 kg)  ?04/28/21 189 lb 9.6 oz (86 kg)  ? ? ?Physical Exam ?Constitutional:   ?   General: He is not in acute distress. ?   Appearance: He is well-developed. He is obese.  ?   Comments: NAD  ?Eyes:  ?   Conjunctiva/sclera: Conjunctivae normal.  ?   Pupils: Pupils are equal, round, and reactive to light.  ?Neck:  ?   Thyroid: No thyromegaly.  ?   Vascular: No JVD.  ?Cardiovascular:  ?   Rate and Rhythm: Normal rate and regular rhythm.  ?   Heart sounds: Normal heart sounds. No murmur heard. ?  No friction rub. No gallop.  ?Pulmonary:  ?  Effort: Pulmonary effort is normal. No respiratory distress.  ?   Breath sounds: Normal breath sounds. No wheezing or rales.  ?Chest:  ?   Chest wall: No tenderness.  ?Abdominal:  ?   General: Bowel sounds are normal. There is no distension.  ?   Palpations: Abdomen is soft. There is no mass.  ?   Tenderness: There is no abdominal tenderness. There is no guarding or rebound.  ?Musculoskeletal:     ?   General: No tenderness. Normal range of motion.  ?   Cervical back: Normal range of motion.  ?Skin: ?   General: Skin is warm and dry.  ?   Capillary Refill: Capillary refill takes 2 to 3 seconds.  ?   Findings: No rash.  ?Neurological:  ?   Mental Status: He is alert and oriented to person, place,  and time.  ?   Cranial Nerves: No cranial nerve deficit.  ?   Motor: No abnormal muscle tone.  ?   Coordination: Coordination normal.  ?   Gait: Gait normal.  ?   Deep Tendon Reflexes: Reflexes are normal and symmetric.  ?Psychiatric:     ?   Behavior: Behavior normal.     ?   Thought Content: Thought content normal.     ?   Judgment: Judgment normal.  ? ? ?Lab Results  ?Component Value Date  ? WBC 6.2 04/28/2021  ? HGB 15.2 04/28/2021  ? HCT 44.2 04/28/2021  ? PLT 185.0 04/28/2021  ? GLUCOSE 142 (H) 04/28/2021  ? CHOL 140 04/28/2021  ? TRIG 122.0 04/28/2021  ? HDL 52.70 04/28/2021  ? [place] 63 04/28/2021  ? ALT 55 (H) 04/28/2021  ? AST 35 04/28/2021  ? NA 142 04/28/2021  ? K 4.1 04/28/2021  ? CL 100 04/28/2021  ? CREATININE 1.00 04/28/2021  ? BUN 19 04/28/2021  ? CO2 35 (H) 04/28/2021  ? TSH 2.58 04/28/2021  ? PSA 2.30 04/28/2021  ? HGBA1C 6.8 (H) 08/26/2020  ? ? ?CT CORONARY MORPH W/CTA COR W/SCORE W/CA W/CM &/OR WO/CM ? ?Addendum Date: 10/24/2017   ?ADDENDUM REPORT: 10/24/2017 17:04 CLINICAL DATA:  18 -year-old male with PAF and abnormal exercise treadmill stress test. EXAM: Cardiac/Coronary  CT TECHNIQUE: The patient was scanned on a [employer]. FINDINGS: A 120 kV prospective scan was triggered in the descending thoracic aorta at 111 HU's. Axial non-contrast 3 mm slices were carried out through the heart. The data set was analyzed on a dedicated work station and scored using the [employer]. Gantry rotation speed was 250 msecs and collimation was .6 mm. 5 mg of iv Metoprolol and 0.8 mg of sl NTG was given. The 3D data set was reconstructed in 5% intervals of the 67-82 % of the R-R cycle. Diastolic phases were analyzed on a dedicated work station using MPR, MIP and VRT modes. The patient received 80 cc of contrast. Aorta: Normal size. Mild calcifications in the descending aorta. No dissection. Aortic Valve:  Trileaflet.  No calcifications. Coronary Arteries:  Normal coronary origin.  Right  dominance. RCA is a large dominant artery that gives rise to PDA and PLVB. There is minimal calcified plaque with associated stenosis 0-25%. Left main is a large artery that gives rise to LAD and LCX arteries. Distal left main has a mild calcified plaque with stenosis 0-25%. LAD is a large vessel that gives rise to two small diagonal arteries. There is mild mixed diffuse plaque with stenosis 0-25%. There is a small  intramyocardial bridge in the distal LAD. LCX is a medium size non-dominant artery. Ostial LCX artery has mild calcified plaque with associated stenosis 25-50%. Mid LCX artery has mixed plaque with stenosis 50-69%. Other findings: Normal pulmonary vein drainage into the left atrium. Normal let atrial appendage without a thrombus. Normal size of the pulmonary artery. IMPRESSION: 1. Coronary calcium score of 262. This was 61 percentile for age and sex matched control. 2. Normal coronary origin with right dominance. 3. 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 Mid LCX artery has mixed plaque with stenosis 50-69%. Additional analysis with CT FFR will be submitted. 4. 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 Electronically Signed   By: Ena Dawley   On: 10/24/2017 17:04  ? ?Result Date: 10/24/2017 ?EXAM: OVER-READ INTERPRETATION  CT CHEST The following report is an over-read performed by radiologist Dr. Rolm Baptise of [facility], [place] on 10/24/2017. This over-read does not include interpretation of cardiac or coronary anatomy or pathology. The coronary CTA interpretation by the cardiologist is attached. COMPARISON:  None. FINDINGS: Vascular: Heart is normal size.  Visualized aorta normal caliber. Mediastinum/Nodes: No adenopathy in the lower mediastinum or hila. Lungs/Pleura: Dependent atelectasis in the lower lobes. No effusions. Upper Abdomen: Imaging into the upper abdomen shows no acute findings. Musculoskeletal: Chest wall soft tissues are  unremarkable. No acute bony abnormality. IMPRESSION: No acute or significant extracardiac abnormality. Electronically Signed: By: Rolm Baptise M.D. On: 10/24/2017 14:11  ? ? ?Assessment & Plan:  ? ?Problem List Items Add

## 2021-10-27 NOTE — Assessment & Plan Note (Signed)
Blue-Emu cream was recommended to use 2-3 times a day ? ?

## 2021-10-27 NOTE — Assessment & Plan Note (Signed)
Check A1c ?Wt loss ?

## 2021-10-27 NOTE — Assessment & Plan Note (Signed)
Cont on Diovan-HCT ?CMET ?

## 2021-10-27 NOTE — Assessment & Plan Note (Addendum)
F/u w/Dr Junious Silk ?Check CMET ?

## 2021-10-28 ENCOUNTER — Other Ambulatory Visit: Payer: Self-pay | Admitting: Internal Medicine

## 2021-10-28 DIAGNOSIS — R739 Hyperglycemia, unspecified: Secondary | ICD-10-CM

## 2021-10-28 MED ORDER — DAPAGLIFLOZIN PROPANEDIOL 5 MG PO TABS: 5.0000 mg | ORAL_TABLET | Freq: Every day | ORAL | 11 refills | Status: DC

## 2021-11-04 ENCOUNTER — Other Ambulatory Visit: Payer: Self-pay | Admitting: Cardiovascular Disease

## 2021-12-17 ENCOUNTER — Other Ambulatory Visit: Payer: Self-pay | Admitting: Internal Medicine

## 2022-01-26 ENCOUNTER — Telehealth: Payer: Self-pay | Admitting: Internal Medicine

## 2022-01-26 NOTE — Telephone Encounter (Signed)
Left message for patient to call back to schedule Medicare Annual Wellness Visit.  Last AWV  04/23/20  Please schedule at anytime with [facility] if patient calls the office back.      Any questions, please call me at [phone]

## 2022-01-28 ENCOUNTER — Encounter: Payer: Self-pay | Admitting: Internal Medicine

## 2022-01-28 ENCOUNTER — Ambulatory Visit (INDEPENDENT_AMBULATORY_CARE_PROVIDER_SITE_OTHER): Payer: Medicare Other | Admitting: Internal Medicine

## 2022-01-28 VITALS — BP 110/68 | HR 72 | Temp 97.6°F | Ht 66.0 in | Wt 185.0 lb

## 2022-01-28 DIAGNOSIS — E119 Type 2 diabetes mellitus without complications: Secondary | ICD-10-CM | POA: Diagnosis not present

## 2022-01-28 DIAGNOSIS — I10 Essential (primary) hypertension: Secondary | ICD-10-CM

## 2022-01-28 DIAGNOSIS — I480 Paroxysmal atrial fibrillation: Secondary | ICD-10-CM

## 2022-01-28 DIAGNOSIS — R739 Hyperglycemia, unspecified: Secondary | ICD-10-CM

## 2022-01-28 LAB — COMPREHENSIVE METABOLIC PANEL
ALT: 51 U/L (ref 0–53)
AST: 32 U/L (ref 0–37)
Albumin: 4.6 g/dL (ref 3.5–5.2)
Alkaline Phosphatase: 93 U/L (ref 39–117)
BUN: 17 mg/dL (ref 6–23)
CO2: 34 mEq/L — ABNORMAL HIGH (ref 19–32)
Calcium: 9.4 mg/dL (ref 8.4–10.5)
Chloride: 101 mEq/L (ref 96–112)
Creatinine, Ser: 0.96 mg/dL (ref 0.40–1.50)
GFR: 77.47 mL/min (ref >60.00)
Glucose, Bld: 114 mg/dL — ABNORMAL HIGH (ref 70–99)
Potassium: 3.8 mEq/L (ref 3.5–5.1)
Sodium: 142 mEq/L (ref 135–145)
Total Bilirubin: 1 mg/dL (ref 0.2–1.2)
Total Protein: 7.5 g/dL (ref 6.0–8.3)

## 2022-01-28 LAB — HEMOGLOBIN A1C: Hgb A1c MFr Bld: 6.8 % — ABNORMAL HIGH (ref 4.6–6.5)

## 2022-01-28 MED ORDER — DAPAGLIFLOZIN PROPANEDIOL 5 MG PO TABS: 5.0000 mg | ORAL_TABLET | Freq: Every day | ORAL | 3 refills | Status: DC

## 2022-01-28 NOTE — Assessment & Plan Note (Signed)
On  Farxiga 

## 2022-01-28 NOTE — Assessment & Plan Note (Signed)
Diovan-HCT

## 2022-01-28 NOTE — Progress Notes (Signed)
Subjective:  Patient ID: William [name]., male    DOB: [birth year]  Age: 76 y.o. MRN: [id number]  CC: No chief complaint on file.   HPI William [name]. presents for DM, HTN, CAD  Outpatient Medications Prior to Visit  Medication Sig Dispense Refill   atorvastatin (LIPITOR) 80 MG tablet TAKE 1 TABLET BY MOUTH DAILY 90 tablet 1   Cholecalciferol (VITAMIN D3) 50 MCG (2000 UT) capsule Take 1 capsule (2,000 Units total) by mouth daily. 100 capsule 3   clotrimazole-betamethasone (LOTRISONE) cream APPLY TO AFFECTED AREA EVERY DAY AS NEEDED 45 g 1   ELIQUIS 5 MG TABS tablet TAKE 1 TABLET BY MOUTH  TWICE DAILY 180 tablet 3   ezetimibe (ZETIA) 10 MG tablet TAKE 1 TABLET BY MOUTH  DAILY 90 tablet 3   potassium chloride SA (KLOR-CON) 20 MEQ tablet TAKE 1 TABLET BY MOUTH  DAILY 90 tablet 3   tamsulosin (FLOMAX) 0.4 MG CAPS capsule Take 0.4 mg by mouth.     triamcinolone cream (KENALOG) 0.1 % APPLY TO AFFECTED AREA TWICE A DAY     valsartan-hydrochlorothiazide (DIOVAN-HCT) 160-25 MG tablet TAKE 1 TABLET BY MOUTH  DAILY 90 tablet 1   vardenafil (LEVITRA) 20 MG tablet Take 0.5-1 tablets (10-20 mg total) by mouth daily as needed. 30 tablet 3   dapagliflozin propanediol (FARXIGA) 5 MG TABS tablet Take 1 tablet (5 mg total) by mouth daily before breakfast. 30 tablet 11   No facility-administered medications prior to visit.    ROS: Review of Systems  Constitutional:  Negative for appetite change, fatigue and unexpected weight change.  HENT:  Negative for congestion, nosebleeds, sneezing, sore throat and trouble swallowing.   Eyes:  Negative for itching and visual disturbance.  Respiratory:  Negative for cough.   Cardiovascular:  Negative for chest pain, palpitations and leg swelling.  Gastrointestinal:  Negative for abdominal distention, blood in stool, diarrhea and nausea.  Genitourinary:  Negative for frequency and hematuria.  Musculoskeletal:  Negative for back pain, gait problem, joint  swelling and neck pain.  Skin:  Negative for rash.  Neurological:  Negative for dizziness, tremors, speech difficulty and weakness.  Psychiatric/Behavioral:  Negative for agitation, dysphoric mood, sleep disturbance and suicidal ideas. The patient is not nervous/anxious.     Objective:  BP 110/68 (BP Location: Left Arm, Patient Position: Sitting, Cuff Size: Normal)   Pulse 72   Temp 97.6 F (36.4 C) (Oral)   Ht '5\' 6"'$  (1.676 m)   Wt 185 lb (83.9 kg)   SpO2 92%   BMI 29.86 kg/m   BP Readings from Last 3 Encounters:  01/28/22 110/68  10/27/21 118/68  04/29/21 132/88    Wt Readings from Last 3 Encounters:  01/28/22 185 lb (83.9 kg)  10/27/21 194 lb (88 kg)  04/29/21 191 lb 6.4 oz (86.8 kg)    Physical Exam Constitutional:      General: He is not in acute distress.    Appearance: He is well-developed.     Comments: NAD  Eyes:     Conjunctiva/sclera: Conjunctivae normal.     Pupils: Pupils are equal, round, and reactive to light.  Neck:     Thyroid: No thyromegaly.     Vascular: No JVD.  Cardiovascular:     Rate and Rhythm: Normal rate and regular rhythm.     Heart sounds: Normal heart sounds. No murmur heard.    No friction rub. No gallop.  Pulmonary:     Effort: Pulmonary  effort is normal. No respiratory distress.     Breath sounds: Normal breath sounds. No wheezing or rales.  Chest:     Chest wall: No tenderness.  Abdominal:     General: Bowel sounds are normal. There is no distension.     Palpations: Abdomen is soft. There is no mass.     Tenderness: There is no abdominal tenderness. There is no guarding or rebound.  Musculoskeletal:        General: No tenderness. Normal range of motion.     Cervical back: Normal range of motion.  Lymphadenopathy:     Cervical: No cervical adenopathy.  Skin:    General: Skin is warm and dry.     Findings: No rash.  Neurological:     Mental Status: He is alert and oriented to person, place, and time.     Cranial Nerves: No  cranial nerve deficit.     Motor: No abnormal muscle tone.     Coordination: Coordination normal.     Gait: Gait normal.     Deep Tendon Reflexes: Reflexes are normal and symmetric.  Psychiatric:        Behavior: Behavior normal.        Thought Content: Thought content normal.        Judgment: Judgment normal.     Lab Results  Component Value Date   WBC 6.2 04/28/2021   HGB 15.2 04/28/2021   HCT 44.2 04/28/2021   PLT 185.0 04/28/2021   GLUCOSE 150 (H) 10/27/2021   CHOL 138 10/27/2021   TRIG 89.0 10/27/2021   HDL 58.40 10/27/2021   LDLCALC 62 10/27/2021   ALT 46 10/27/2021   AST 30 10/27/2021   NA 140 10/27/2021   K 3.7 10/27/2021   CL 100 10/27/2021   CREATININE 1.08 10/27/2021   BUN 20 10/27/2021   CO2 33 (H) 10/27/2021   TSH 2.58 04/28/2021   PSA 2.30 04/28/2021   HGBA1C 7.3 (H) 10/27/2021    CT CORONARY MORPH W/CTA COR W/SCORE W/CA W/CM &/OR WO/CM  Addendum Date: 10/24/2017   ADDENDUM REPORT: 10/24/2017 17:04 CLINICAL DATA:  6 -year-old male with PAF and abnormal exercise treadmill stress test. EXAM: Cardiac/Coronary  CT TECHNIQUE: The patient was scanned on a [employer]. FINDINGS: A 120 kV prospective scan was triggered in the descending thoracic aorta at 111 HU's. Axial non-contrast 3 mm slices were carried out through the heart. The data set was analyzed on a dedicated work station and scored using the [place]. Gantry rotation speed was 250 msecs and collimation was .6 mm. 5 mg of iv Metoprolol and 0.8 mg of sl NTG was given. The 3D data set was reconstructed in 5% intervals of the 67-82 % of the R-R cycle. Diastolic phases were analyzed on a dedicated work station using MPR, MIP and VRT modes. The patient received 80 cc of contrast. Aorta: Normal size. Mild calcifications in the descending aorta. No dissection. Aortic Valve:  Trileaflet.  No calcifications. Coronary Arteries:  Normal coronary origin.  Right dominance. RCA is a large dominant artery  that gives rise to PDA and PLVB. There is minimal calcified plaque with associated stenosis 0-25%. Left main is a large artery that gives rise to LAD and LCX arteries. Distal left main has a mild calcified plaque with stenosis 0-25%. LAD is a large vessel that gives rise to two small diagonal arteries. There is mild mixed diffuse plaque with stenosis 0-25%. There is a small intramyocardial bridge in the distal  LAD. LCX is a medium size non-dominant artery. Ostial LCX artery has mild calcified plaque with associated stenosis 25-50%. Mid LCX artery has mixed plaque with stenosis 50-69%. Other findings: Normal pulmonary vein drainage into the left atrium. Normal let atrial appendage without a thrombus. Normal size of the pulmonary artery. IMPRESSION: 1. Coronary calcium score of 262. This was 47 percentile for age and sex matched control. 2. Normal coronary origin with right dominance. 3. 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 Mid LCX artery has mixed plaque with stenosis 50-69%. Additional analysis with CT FFR will be submitted. 4. 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 Electronically Signed   By: Ena Dawley   On: 10/24/2017 17:04   Result Date: 10/24/2017 EXAM: OVER-READ INTERPRETATION  CT CHEST The following report is an over-read performed by radiologist Dr. Rolm Baptise of [facility], [place] on 10/24/2017. This over-read does not include interpretation of cardiac or coronary anatomy or pathology. The coronary CTA interpretation by the cardiologist is attached. COMPARISON:  None. FINDINGS: Vascular: Heart is normal size.  Visualized aorta normal caliber. Mediastinum/Nodes: No adenopathy in the lower mediastinum or hila. Lungs/Pleura: Dependent atelectasis in the lower lobes. No effusions. Upper Abdomen: Imaging into the upper abdomen shows no acute findings. Musculoskeletal: Chest wall soft tissues are unremarkable. No acute bony abnormality.  IMPRESSION: No acute or significant extracardiac abnormality. Electronically Signed: By: Rolm Baptise M.D. On: 10/24/2017 14:11    Assessment & Plan:   Problem List Items Addressed This Visit     DM (diabetes mellitus), type 2 ([place])    On Farxiga      Relevant Medications   dapagliflozin propanediol (FARXIGA) 5 MG TABS tablet   Other Relevant Orders   Comprehensive metabolic panel   Hemoglobin A1c   Essential hypertension     Diovan-HCT      Hyperglycemia - Primary    On Farxiga      Relevant Medications   dapagliflozin propanediol (FARXIGA) 5 MG TABS tablet   Paroxysmal atrial fibrillation (HCC)     On Eliquis         Meds ordered this encounter  Medications   dapagliflozin propanediol (FARXIGA) 5 MG TABS tablet    Sig: Take 1 tablet (5 mg total) by mouth daily before breakfast.    Dispense:  90 tablet    Refill:  3      Follow-up: Return in about 6 months (around 07/31/2022) for Wellness Exam.  Walker Kehr, MD

## 2022-01-28 NOTE — Assessment & Plan Note (Signed)
On Eliquis

## 2022-02-10 ENCOUNTER — Other Ambulatory Visit: Payer: Self-pay | Admitting: Medical

## 2022-02-11 NOTE — Telephone Encounter (Signed)
Prescription refill request for Eliquis received. Indication:Afib Last office visit:needs appt Scr:0.9 Age: 76 Weight:83.9 kg  Prescription refilled

## 2022-02-24 ENCOUNTER — Other Ambulatory Visit: Payer: Self-pay | Admitting: Internal Medicine

## 2022-03-22 ENCOUNTER — Encounter: Payer: Self-pay | Admitting: Family Medicine

## 2022-03-22 ENCOUNTER — Telehealth (INDEPENDENT_AMBULATORY_CARE_PROVIDER_SITE_OTHER): Payer: Medicare Other | Admitting: Family Medicine

## 2022-03-22 VITALS — HR 88 | Temp 98.7°F | Ht 66.0 in | Wt 180.0 lb

## 2022-03-22 DIAGNOSIS — U071 COVID-19: Secondary | ICD-10-CM

## 2022-03-22 MED ORDER — BENZONATATE 100 MG PO CAPS: 100.0000 mg | ORAL_CAPSULE | Freq: Three times a day (TID) | ORAL | 0 refills | Status: DC | PRN

## 2022-03-22 MED ORDER — MOLNUPIRAVIR EUA 200MG CAPSULE: 4.0000 | ORAL_CAPSULE | Freq: Two times a day (BID) | ORAL | 0 refills | Status: AC

## 2022-03-22 NOTE — Progress Notes (Signed)
Virtual Visit via Video Note  I connected with William [name]. on 03/22/22 at 5:11 PM by a video enabled telemedicine application and verified that I am speaking with the correct person using two identifiers.  Patient location: home with wife - consent obtained to discuss PHI.  My location: office - [place].    I discussed the limitations, risks, security and privacy concerns of performing an evaluation and management service by telephone and the availability of in person appointments. I also discussed with the patient that there may be a patient responsible charge related to this service. The patient expressed understanding and agreed to proceed, consent obtained  Chief complaint:  Chief Complaint  Patient presents with   Covid Positive    Pt states as of Sunday Morning he is positive for covid     History of Present Illness: William [name]. is a 76 y.o. male  COVID-19 infection Positive testing yesterday Initial symptoms started 2 days ago. Headache, sore throat and nasal congestion, hurts all over.  Worse HA yesterday - less today. More chest congestion today.  Slightly better today.  Not feeling shortness of breath - able to walk to pond without dyspnea. Home O2 sat -90-93%., 94-96% usually. BP on lower side 100/60 range. No lightheadedness, near-syncope or confusion. No chest pain or tightness. .  Wife also positive for covid currently. 1st time for both of them having this infection.  Felt worse yesterday.  Drinking fluids.  Sleeping ok, minimal cough at night.   Tx: mucinex, tylenol, zinc, vitamin C, vitamin D.   Paxlovid contraindicated as on apixaban.  Also on statin.  Immunization History  Administered Date(s) Administered   Fluad Quad(high Dose 65+) 03/16/2019, 04/24/2020   Hepatitis A 05/11/2006   Influenza Split 05/17/2011, 05/17/2012   Influenza Whole 05/11/2006, 05/23/2007, 05/14/2008, 05/19/2009, 05/14/2010   Influenza, High Dose Seasonal PF  05/12/2017, 05/17/2018   Influenza,inj,Quad PF,6+ Mos 06/07/2013, 05/05/2016   Influenza-Unspecified 04/21/2015   Moderna Covid-19 Vaccine Bivalent Booster 74yr & up 04/16/2021   PFIZER(Purple Top)SARS-COV-2 Vaccination 07/24/2019, 08/14/2019, 04/08/2020   Pneumococcal Conjugate-13 05/17/2008, 10/23/2020   Pneumococcal Polysaccharide-23 11/04/2015   Tdap 06/04/2011   Zoster, Live 12/05/2012      Patient Active Problem List   Diagnosis Date Noted   DM (diabetes mellitus), type 2 ([place] 01/28/2022   Renal stones 10/27/2021   Hematuria 04/29/2021   Erosive balanitis 07/12/2019   Paraphimosis 07/12/2019   Coronary artery disease involving native coronary artery of native heart without angina pectoris 08/23/2018   QT prolongation 08/23/2018   Chronic anticoagulation 01/18/2018   Elevated LFTs 11/11/2015   Paroxysmal atrial fibrillation ([place] 10/01/2014   Sinus bradycardia 10/01/2014   Junctional escape rhythm 10/01/2014   Murmur, cardiac 08/30/2014   Rapid palpitations 08/30/2014   Tachycardia 07/08/2014   Right hip pain 12/06/2013   Cerumen impaction 12/05/2012   Hyperglycemia 12/06/2011   Travel foreign 06/04/2011   Viral warts 03/18/2011   Well adult exam 12/03/2010   Actinic keratoses 12/03/2010   Neoplasm of uncertain behavior of skin 12/03/2010   S/P appendectomy 10/23/2010   LEG PAIN 05/14/2010   SKIN RASH 05/14/2010   PROSTATITIS, ACUTE 11/18/2009   PSA, INCREASED 11/18/2009   BRONCHITIS, MILD 09/02/2009   GERD 09/26/2007   Obstructive sleep apnea 09/21/2007   OTITIS EXTERNA 05/23/2007   ERECTILE DYSFUNCTION 05/23/2007   Dyslipidemia 04/22/2007   Essential hypertension 04/22/2007   History of colon polyps 04/22/2007   Past Medical History:  Diagnosis Date  Atrial fibrillation (HCC)    Colon polyps    Dr Sharlett Iles   GERD (gastroesophageal reflux disease)    HTN (hypertension)    Hyperlipidemia    OSA on CPAP    Prostatitis    Past Surgical History:   Procedure Laterality Date   APPENDECTOMY  2012   09/2010 in [place]   HERNIA REPAIR  1975   ROTATOR CUFF REPAIR  2008   left; Dr Para March   Allergies  Allergen Reactions   Benazepril Hcl Other (See Comments)    REACTION: cough   Prior to Admission medications   Medication Sig Start Date End Date Taking? Authorizing Provider  apixaban (ELIQUIS) 5 MG TABS tablet Take 1 tablet (5 mg total) by mouth 2 (two) times daily. NEEDS CARDIOLOGY APPOINTMENT FOR ELIQUIS REFILLS 02/11/22  Yes Croitoru, Mihai, MD  atorvastatin (LIPITOR) 80 MG tablet TAKE 1 TABLET BY MOUTH DAILY 11/05/21  Yes Warren Lacy, PA-C  Cholecalciferol (VITAMIN D3) 50 MCG (2000 UT) capsule Take 1 capsule (2,000 Units total) by mouth daily. 04/24/19  Yes Plotnikov, Evie Lacks, MD  clotrimazole-betamethasone (LOTRISONE) cream APPLY TO AFFECTED AREA EVERY DAY AS NEEDED 10/23/20  Yes Plotnikov, Evie Lacks, MD  dapagliflozin propanediol (FARXIGA) 5 MG TABS tablet Take 1 tablet (5 mg total) by mouth daily before breakfast. 01/28/22  Yes Plotnikov, Evie Lacks, MD  ezetimibe (ZETIA) 10 MG tablet TAKE 1 TABLET BY MOUTH  DAILY 05/26/21  Yes Plotnikov, Evie Lacks, MD  potassium chloride SA (KLOR-CON M) 20 MEQ tablet Take 1 tablet (20 mEq total) by mouth daily. Annual appt due in Nov must see provider for future refills 02/25/22  Yes Plotnikov, Evie Lacks, MD  tamsulosin (FLOMAX) 0.4 MG CAPS capsule Take 0.4 mg by mouth.   Yes [provider]  triamcinolone cream (KENALOG) 0.1 % APPLY TO AFFECTED AREA TWICE A DAY 07/09/19  Yes [provider]  valsartan-hydrochlorothiazide (DIOVAN-HCT) 160-25 MG tablet TAKE 1 TABLET BY MOUTH  DAILY 12/18/21  Yes Plotnikov, Evie Lacks, MD  vardenafil (LEVITRA) 20 MG tablet Take 0.5-1 tablets (10-20 mg total) by mouth daily as needed. 11/14/17  Yes Plotnikov, Evie Lacks, MD   Social History   Socioeconomic History   Marital status: Married    Spouse name: Not on file   Number of children: 3   Years  of education: Not on file   Highest education level: Not on file  Occupational History   Occupation: Accountant/Retired  Tobacco Use   Smoking status: Never   Smokeless tobacco: Never  Vaping Use   Vaping Use: Never used  Substance and Sexual Activity   Alcohol use: No   Drug use: No   Sexual activity: Yes  Other Topics Concern   Not on file  Social History Narrative   Regular Exercise - Yes, gym            Social Determinants of Health   Financial Resource Strain: Low Risk  (04/23/2020)   Overall Financial Resource Strain (CARDIA)    Difficulty of Paying Living Expenses: Not hard at all  Food Insecurity: No Food Insecurity (04/23/2020)   Hunger Vital Sign    Worried About Running Out of Food in the Last Year: Never true    [place] in the Last Year: Never true  Transportation Needs: No Transportation Needs (04/23/2020)   PRAPARE - Hydrologist (Medical): No    Lack of Transportation (Non-Medical): No  Physical Activity: Sufficiently Active (04/23/2020)  Exercise Vital Sign    Days of Exercise per Week: 5 days    Minutes of Exercise per Session: 30 min  Stress: No Stress Concern Present (04/23/2020)   [place]    Feeling of Stress : Not at all  Social Connections: [place] (04/23/2020)   Social Connection and Isolation Panel [NHANES]    Frequency of Communication with Friends and Family: More than three times a week    Frequency of Social Gatherings with Friends and Family: More than three times a week    Attends Religious Services: More than 4 times per year    Active Member of [employer] or Organizations: Yes    Attends Music therapist: More than 4 times per year    Marital Status: Married  Human resources officer Violence: Not on file    Observations/Objective: Vitals:   03/22/22 1613  Pulse: 88  Temp: 98.7 F (37.1 C)  SpO2: 93%  Weight:  180 lb (81.6 kg)  Height: '5\' 6"'$  (1.676 m)  Nontoxic appearance on video.  Speaking in full sentences without audible stridor or wheeze, no respiratory distress.  Appropriate, coherent responses.  Euthymic mood.  All questions were answered with understanding of plan expressed.   Assessment and Plan: COVID-19 virus infection - Plan: benzonatate (TESSALON) 100 MG capsule, molnupiravir EUA (LAGEVRIO) 200 mg CAPS capsule Day 2 of COVID infection as above.    -Borderline low blood pressure, will decrease his combo antihypertensive to half pill for now until blood pressures improved or if elevated.  ER precautions given.  Asymptomatic currently.  -Borderline low O2 sat but again asymptomatic.  ER precautions given if any dyspnea.  -Treatment options discussed, started on molnupiravir given other medications and Paxlovid contraindication with Eliquis.  Potential side effects and risks of rebound COVID including treatment changes if that were to occur.  -Continue Mucinex, offer symptomatic care, Tessalon Perles if needed.  -Isolation/masking precautions and timing given with ER precautions discussed.  Follow Up Instructions: As needed with ER precautions.   I discussed the assessment and treatment plan with the patient. The patient was provided an opportunity to ask questions and all were answered. The patient agreed with the plan and demonstrated an understanding of the instructions.   The patient was advised to call back or seek an in-person evaluation if the symptoms worsen or if the condition fails to improve as anticipated.   Wendie Agreste, MD

## 2022-03-22 NOTE — Patient Instructions (Addendum)
Cut back to 1/2 pill of your blood pressure until blood pressures start to improve or if elevated blood pressure.  Antiviral medication molnupiravir sent to pharmacy.  Mucinex or tessalon for cough, Tylenol as needed for headache or body aches.  Make sure to drink plenty of fluids.  Rest as needed.  If any chest pain, shortness of breath, especially at rest, confusion or difficulty drinking fluids, be seen in the emergency room.  Hope you feel better soon and please let us know if we can help further.  Take care!   Everyone who has presumed or confirmed COVID-19 should stay home and isolate from other people for at least 5 full days (day 0 is the first day of symptoms or the date of the day of the positive viral test for asymptomatic persons). You can end isolation after 5 full days if you are fever-free for 24 hours without the use of fever-reducing medication and your other symptoms have improved (Loss of taste and smell may persist for weeks or months after recovery and need not delay the end of isolation). You should continue to wear a well-fitting mask around others at home and in public for 5 additional days (day 6 through day 10) after the end of your 5-day isolation period. If you are unable to wear a mask when around others, you should continue to isolate for a full 10 days. Avoid people who have weakened immune systems or are more likely to get very sick from COVID-19, and nursing homes and other high-risk settings, until after at least 10 days.  If you continue to have fever or your other symptoms have not improved after 5 days of isolation, you should wait to end your isolation until you are fever-free for 24 hours without the use of fever-reducing medication and your other symptoms have improved. Continue to wear a well-fitting mask through day 10.   https://brown.org/.html

## 2022-03-26 ENCOUNTER — Other Ambulatory Visit: Payer: Self-pay | Admitting: Cardiovascular Disease

## 2022-03-29 NOTE — Telephone Encounter (Signed)
Prescription refill request for Eliquis received. Indication: PAF Last office visit: 01/21/21  Melvenia Needles PA-C Scr: 0.96 on 01/28/22 Age: 76 Weight: 87.5kg  Based on above findings Eliquis '5mg'$  twice daily is the appropriate dose.  Refill approved x 1.  Pt is past due to see MD.  Message sent to schedulers.

## 2022-04-07 ENCOUNTER — Encounter: Payer: Self-pay | Admitting: Cardiovascular Disease

## 2022-04-07 ENCOUNTER — Ambulatory Visit: Payer: Medicare Other | Attending: Cardiovascular Disease | Admitting: Cardiovascular Disease

## 2022-04-07 VITALS — BP 132/86 | HR 82 | Ht 66.0 in | Wt 185.0 lb

## 2022-04-07 DIAGNOSIS — I480 Paroxysmal atrial fibrillation: Secondary | ICD-10-CM

## 2022-04-07 DIAGNOSIS — I2510 Atherosclerotic heart disease of native coronary artery without angina pectoris: Secondary | ICD-10-CM

## 2022-04-07 DIAGNOSIS — D6869 Other thrombophilia: Secondary | ICD-10-CM

## 2022-04-07 DIAGNOSIS — R9431 Abnormal electrocardiogram [ECG] [EKG]: Secondary | ICD-10-CM

## 2022-04-07 DIAGNOSIS — E7800 Pure hypercholesterolemia, unspecified: Secondary | ICD-10-CM | POA: Diagnosis not present

## 2022-04-07 DIAGNOSIS — E119 Type 2 diabetes mellitus without complications: Secondary | ICD-10-CM

## 2022-04-07 DIAGNOSIS — I10 Essential (primary) hypertension: Secondary | ICD-10-CM

## 2022-04-07 MED ORDER — METOPROLOL TARTRATE 25 MG PO TABS: 25.0000 mg | ORAL_TABLET | Freq: Three times a day (TID) | ORAL | 3 refills | Status: DC | PRN

## 2022-04-07 NOTE — Patient Instructions (Signed)
Medication Instructions:  TAKE Metoprolol Tartrate 25 mg as needed three times daily for afib   *If you need a refill on your cardiac medications before your next appointment, please call your pharmacy*   Lab Work: None ordered If you have labs (blood work) drawn today and your tests are completely normal, you will receive your results only by: [place] (if you have MyChart) OR A paper copy in the mail If you have any lab test that is abnormal or we need to change your treatment, we will call you to review the results.   Testing/Procedures: None ordered   Follow-Up: At [facility], you and your health needs are our priority.  As part of our continuing mission to provide you with exceptional heart care, we have created designated Provider Care Teams.  These Care Teams include your primary Cardiologist (physician) and Advanced Practice Providers (APPs -  Physician Assistants and Nurse Practitioners) who all work together to provide you with the care you need, when you need it.  We recommend signing up for the patient portal called "MyChart".  Sign up information is provided on this After Visit Summary.  MyChart is used to connect with patients for Virtual Visits (Telemedicine).  Patients are able to view lab/test results, encounter notes, upcoming appointments, etc.  Non-urgent messages can be sent to your provider as well.   To learn more about what you can do with MyChart, go to [email or web address].    Your next appointment:   12 month(s)  The format for your next appointment:   In Person  Provider:   Sanda Klein, MD     Important Information About Sugar

## 2022-04-07 NOTE — Progress Notes (Signed)
Patient ID: William [name]., male   DOB: 1946/04/18, 76 y.o.   MRN: [id number]     Cardiology Office Note    Date:  04/08/2022   ID:  William [name]., DOB [birth year], MRN [id number]  PCP:  Cassandria Anger, MD  Cardiologist:   Sanda Klein, MD   Chief Complaint  Patient presents with   Atrial Fibrillation    History of Present Illness:  William [name]. is a 76 y.o. male with paroxysmal atrial fibrillation, diabetes mellitus type 2 without complications, hypertension, hypercholesterolemia on statin, moderate nonobstructive CAD, returning for routine follow-up.   He has done very well from a cardiovascular point of view.  He has had a handful of episodes of paroxysmal atrial fibrillation that he feels as palpitations, without dizziness, syncope dyspnea or chest pain.  He has a smart watch that has correctly identified his episodes atrial fibrillation, typically with heart rates of 120-130 bpm.  It only happens once every 3 to 4 months and only last for couple of hours.  He just feels a little tired when they occur.  He remains compliant with oral anticoagulation.  He has not had any focal neurological events, bleeding issues, falls or injuries.  He is very physically active following his retirement.  He continues to exercise 2-3 times a week at the gym for an hour each time.  He had COVID-19 infection last month from which she has recovered without issue.  In February 2019, he had asymptomatic changes on his electrocardiogram (ST changes and QTC prolongation) and his treadmill stress test showed evidence of significant ST segment depression with exercise.  This was followed up with a coronary CT angiogram.  The calcium score was 262 (61st percentile) but the only significant abnormality was a 50-69% stenosis in the mid left circumflex coronary artery.  Incidental note was made of a small distal LAD intramyocardial bridge.  Medical therapy was recommended since he was  asymptomatic.  The patient specifically denies any chest pain at rest exertion, dyspnea at rest or with exertion, orthopnea, paroxysmal nocturnal dyspnea, syncope, palpitations, focal neurological deficits, intermittent claudication, lower extremity edema, unexplained weight gain, cough, hemoptysis or wheezing.   Most recent labs showed an excellent LDL cholesterol at 62, HDL 58 and satisfactory glycemic control with a hemoglobin A1c of 6.8%.  Treatment with beta blockers in the past led to a symptomatic junctional escape rhythm and he is currently not on any rate control medications. His blood pressure is well controlled and he takes a statin for hyperlipidemia.  When his dose of statin was reduced for transient abnormality in the liver test, his LDL increased to 126.  He is now back on the full dose of statin.   Past Medical History:  Diagnosis Date   Atrial fibrillation [facility])    Colon polyps    Dr Sharlett Iles   GERD (gastroesophageal reflux disease)    HTN (hypertension)    Hyperlipidemia    OSA on CPAP    Prostatitis     Past Surgical History:  Procedure Laterality Date   APPENDECTOMY  2012   09/2010 in [place]   HERNIA REPAIR  1975   ROTATOR CUFF REPAIR  2008   left; Dr Para March    Outpatient Medications Prior to Visit  Medication Sig Dispense Refill   apixaban (ELIQUIS) 5 MG TABS tablet Take 1 tablet (5 mg total) by mouth 2 (two) times daily. Need to make an appt with Cardiologist for future refills.  Please call office. 60 tablet 1   atorvastatin (LIPITOR) 80 MG tablet TAKE 1 TABLET BY MOUTH DAILY 90 tablet 1   Cholecalciferol (VITAMIN D3) 50 MCG (2000 UT) capsule Take 1 capsule (2,000 Units total) by mouth daily. 100 capsule 3   clotrimazole-betamethasone (LOTRISONE) cream APPLY TO AFFECTED AREA EVERY DAY AS NEEDED 45 g 1   dapagliflozin propanediol (FARXIGA) 5 MG TABS tablet Take 1 tablet (5 mg total) by mouth daily before breakfast. 90 tablet 3   ezetimibe (ZETIA) 10 MG  tablet TAKE 1 TABLET BY MOUTH  DAILY 90 tablet 3   potassium chloride SA (KLOR-CON M) 20 MEQ tablet Take 1 tablet (20 mEq total) by mouth daily. Annual appt due in Nov must see provider for future refills 90 tablet 0   tamsulosin (FLOMAX) 0.4 MG CAPS capsule Take 0.4 mg by mouth.     triamcinolone cream (KENALOG) 0.1 % APPLY TO AFFECTED AREA TWICE A DAY     valsartan-hydrochlorothiazide (DIOVAN-HCT) 160-25 MG tablet TAKE 1 TABLET BY MOUTH  DAILY 90 tablet 1   vardenafil (LEVITRA) 20 MG tablet Take 0.5-1 tablets (10-20 mg total) by mouth daily as needed. 30 tablet 3   benzonatate (TESSALON) 100 MG capsule Take 1 capsule (100 mg total) by mouth 3 (three) times daily as needed for cough. (Patient not taking: Reported on 04/07/2022) 20 capsule 0   No facility-administered medications prior to visit.     Allergies:   Benazepril hcl   Social History   Socioeconomic History   Marital status: Married    Spouse name: Not on file   Number of children: 3   Years of education: Not on file   Highest education level: Not on file  Occupational History   Occupation: Accountant/Retired  Tobacco Use   Smoking status: Never   Smokeless tobacco: Never  Vaping Use   Vaping Use: Never used  Substance and Sexual Activity   Alcohol use: No   Drug use: No   Sexual activity: Yes  Other Topics Concern   Not on file  Social History Narrative   Regular Exercise - Yes, gym            Social Determinants of Health   Financial Resource Strain: Low Risk  (04/23/2020)   Overall Financial Resource Strain (CARDIA)    Difficulty of Paying Living Expenses: Not hard at all  Food Insecurity: No Food Insecurity (04/23/2020)   Hunger Vital Sign    Worried About Running Out of Food in the Last Year: Never true    [place] in the Last Year: Never true  Transportation Needs: No Transportation Needs (04/23/2020)   PRAPARE - Hydrologist (Medical): No    Lack of Transportation  (Non-Medical): No  Physical Activity: Sufficiently Active (04/23/2020)   Exercise Vital Sign    Days of Exercise per Week: 5 days    Minutes of Exercise per Session: 30 min  Stress: No Stress Concern Present (04/23/2020)   [place]    Feeling of Stress : Not at all  Social Connections: [place] (04/23/2020)   Social Connection and Isolation Panel [NHANES]    Frequency of Communication with Friends and Family: More than three times a week    Frequency of Social Gatherings with Friends and Family: More than three times a week    Attends Religious Services: More than 4 times per year    Active Member of  Clubs or Organizations: Yes    Attends Music therapist: More than 4 times per year    Marital Status: Married     Family History:  The patient's family history includes Asthma in his son; Coronary artery disease in his mother; Heart disease (age of onset: 31) in his father and mother; Hypertension in an other family member; Stroke in his sister.   ROS:   Please see the history of present illness.    ROS All other systems are reviewed and are negative  PHYSICAL EXAM:   VS:  BP 132/86 (BP Location: Left Arm, Patient Position: Sitting, Cuff Size: Normal)   Pulse 82   Ht '5\' 6"'$  (1.676 m)   Wt 185 lb (83.9 kg)   SpO2 97%   BMI 29.86 kg/m       General: Alert, oriented x3, no distress, overweight Head: no evidence of trauma, PERRL, EOMI, no exophtalmos or lid lag, no myxedema, no xanthelasma; normal ears, nose and oropharynx Neck: normal jugular venous pulsations and no hepatojugular reflux; brisk carotid pulses without delay and no carotid bruits Chest: clear to auscultation, no signs of consolidation by percussion or palpation, normal fremitus, symmetrical and full respiratory excursions Cardiovascular: normal position and quality of the apical impulse, regular rhythm, normal first and second  heart sounds, no murmurs, rubs or gallops Abdomen: no tenderness or distention, no masses by palpation, no abnormal pulsatility or arterial bruits, normal bowel sounds, no hepatosplenomegaly Extremities: no clubbing, cyanosis or edema; 2+ radial, ulnar and brachial pulses bilaterally; 2+ right femoral, posterior tibial and dorsalis pedis pulses; 2+ left femoral, posterior tibial and dorsalis pedis pulses; no subclavian or femoral bruits Neurological: grossly nonfocal Psych: Normal mood and affect    Wt Readings from Last 3 Encounters:  04/07/22 185 lb (83.9 kg)  03/22/22 180 lb (81.6 kg)  01/28/22 185 lb (83.9 kg)      Studies/Labs Reviewed:   EKG:  EKG is  ordered today.  It shows normal sinus rhythm with nonspecific ST segment changes in leads I and aVL and a borderline QTc of 469 ms.  Recent Labs: 04/28/2021: Hemoglobin 15.2; Platelets 185.0; TSH 2.58 01/28/2022: ALT 51; BUN 17; Creatinine, Ser 0.96; Potassium 3.8; Sodium 142   Lipid Panel    Component Value Date/Time   CHOL 138 10/27/2021 0916   CHOL 146 11/09/2019 0959   TRIG 89.0 10/27/2021 0916   TRIG 67 05/03/2006 0753   HDL 58.40 10/27/2021 0916   HDL 55 11/09/2019 0959   CHOLHDL 2 10/27/2021 0916   VLDL 17.8 10/27/2021 0916   LDLCALC 62 10/27/2021 0916   LDLCALC 73 11/09/2019 0959  01/21/2022 Cholesterol 118, HDL 40, LDL 61, triglycerides 88, hemoglobin A1c 5.7%   CHA2DS2-VASc Score = 4  The patient's score is based upon: CHF History: 0 HTN History: 1 Diabetes History: 1 Stroke History: 0 Vascular Disease History: 0 Age Score: 2 Gender Score: 0       ASSESSMENT AND PLAN: Paroxysmal Atrial Fibrillation (ICD10:  I48.0) The patient's CHA2DS2-VASc score is 4, indicating a 4.8% annual risk of stroke.    Secondary Hypercoagulable State (ICD10:  D68.69) The patient is at significant risk for stroke/thromboembolism based upon his CHA2DS2-VASc Score of 4.  Continue Apixaban (Eliquis).    Signed,  Sanda Klein, MD    04/08/2022 2:20 PM     ASSESSMENT:    1. Paroxysmal atrial fibrillation (HCC)   2. Acquired thrombophilia ([place])   3. Coronary artery disease involving native coronary  artery of native heart without angina pectoris   4. Hypercholesterolemia   5. Essential hypertension   6. Type 2 diabetes mellitus without complication, without long-term current use of insulin (HCC)   7. Prolonged Q-T interval on ECG      PLAN:  In order of problems listed above:  PAF: Infrequent, self-limited episodes of mildly symptomatic paroxysmal atrial fibrillation, detected by his smart watch, manifesting primarily as fatigue, not palpitations.  He is on anticoagulation.  Gave him a prescription for a low-dose of metoprolol to take as needed when he has atrial fibrillation rapid ventricular response. Eliquis: Well-tolerated without bleeding problems. CAD: Asymptomatic.  He is physically active. HLP: All lipid parameters in target range on statin HTN: Well controlled DM: Well-controlled on SGLT2 inhibitor Long QT: Borderline prolonged QTc interval, which has happened in the past when he had hypokalemia.  Normal potassium level at 4.5 on labs performed several weeks ago.   Medication Adjustments/Labs and Tests Ordered: Current medicines are reviewed at length with the patient today.  Concerns regarding medicines are outlined above.  Medication changes, Labs and Tests ordered today are listed in the Patient Instructions below. Patient Instructions  Medication Instructions:  TAKE Metoprolol Tartrate 25 mg as needed three times daily for afib   *If you need a refill on your cardiac medications before your next appointment, please call your pharmacy*   Lab Work: None ordered If you have labs (blood work) drawn today and your tests are completely normal, you will receive your results only by: [place] (if you have MyChart) OR A paper copy in the mail If you have any lab test that is  abnormal or we need to change your treatment, we will call you to review the results.   Testing/Procedures: None ordered   Follow-Up: At [facility], you and your health needs are our priority.  As part of our continuing mission to provide you with exceptional heart care, we have created designated Provider Care Teams.  These Care Teams include your primary Cardiologist (physician) and Advanced Practice Providers (APPs -  Physician Assistants and Nurse Practitioners) who all work together to provide you with the care you need, when you need it.  We recommend signing up for the patient portal called "MyChart".  Sign up information is provided on this After Visit Summary.  MyChart is used to connect with patients for Virtual Visits (Telemedicine).  Patients are able to view lab/test results, encounter notes, upcoming appointments, etc.  Non-urgent messages can be sent to your provider as well.   To learn more about what you can do with MyChart, go to [email or web address].    Your next appointment:   12 month(s)  The format for your next appointment:   In Person  Provider:   Sanda Klein, MD     Important Information About Sugar           Signed, Sanda Klein, MD  04/08/2022 2:20 PM    [facility] [address], [place], [place]  [address] Phone: [phone]; Fax: [phone]

## 2022-04-08 ENCOUNTER — Encounter: Payer: Self-pay | Admitting: Cardiovascular Disease

## 2022-04-20 ENCOUNTER — Other Ambulatory Visit: Payer: Self-pay | Admitting: Physician Assistant

## 2022-04-21 ENCOUNTER — Other Ambulatory Visit: Payer: Self-pay | Admitting: Internal Medicine

## 2022-05-02 ENCOUNTER — Other Ambulatory Visit: Payer: Self-pay | Admitting: Cardiovascular Disease

## 2022-05-03 NOTE — Telephone Encounter (Signed)
Prescription refill request for Eliquis received. Indication:afib Last office visit:10/23 Scr:0.9 Age: 77 Weight:83.9 kg  Prescription refilled

## 2022-05-04 ENCOUNTER — Encounter: Payer: Medicare Other | Admitting: Internal Medicine

## 2022-05-06 NOTE — Progress Notes (Cosign Needed)
Subjective:   William W Jamerius Boeckman. is a 76 y.o. male who presents for Medicare Annual/Subsequent preventive examination. I connected with  [name] on 05/07/22 by a audio enabled telemedicine application and verified that I am speaking with the correct person using two identifiers.  Patient Location: Home  Provider Location: Home Office  I discussed the limitations of evaluation and management by telemedicine. The patient expressed understanding and agreed to proceed.  Review of Systems    Deferred to PCP Cardiac Risk Factors include: advanced age (>66mn, >>62women);diabetes mellitus;dyslipidemia;male gender;hypertension     Objective:    There were no vitals filed for this visit. There is no height or weight on file to calculate BMI.     05/07/2022   10:53 AM 04/23/2020    3:05 PM  Advanced Directives  Does Patient Have a Medical Advance Directive? Yes No  Does patient want to make changes to medical advance directive? No - Patient declined   Would patient like information on creating a medical advance directive?  No - Patient declined    Current Medications (verified) Outpatient Encounter Medications as of 05/07/2022  Medication Sig   apixaban (ELIQUIS) 5 MG TABS tablet Take 1 tablet (5 mg total) by mouth 2 (two) times daily.   atorvastatin (LIPITOR) 80 MG tablet TAKE 1 TABLET BY MOUTH ONCE  DAILY   Cholecalciferol (VITAMIN D3) 50 MCG (2000 UT) capsule Take 1 capsule (2,000 Units total) by mouth daily.   clotrimazole-betamethasone (LOTRISONE) cream APPLY TO AFFECTED AREA EVERY DAY AS NEEDED   dapagliflozin propanediol (FARXIGA) 5 MG TABS tablet Take 1 tablet (5 mg total) by mouth daily before breakfast.   ezetimibe (ZETIA) 10 MG tablet TAKE 1 TABLET BY MOUTH DAILY   metoprolol tartrate (LOPRESSOR) 25 MG tablet Take 1 tablet (25 mg total) by mouth 3 (three) times daily as needed (rapid rates with afib).   potassium chloride SA (KLOR-CON M) 20 MEQ tablet Take 1  tablet (20 mEq total) by mouth daily. Annual appt due in Nov must see provider for future refills   tamsulosin (FLOMAX) 0.4 MG CAPS capsule Take 0.4 mg by mouth.   triamcinolone cream (KENALOG) 0.1 % APPLY TO AFFECTED AREA TWICE A DAY   valsartan-hydrochlorothiazide (DIOVAN-HCT) 160-25 MG tablet TAKE 1 TABLET BY MOUTH  DAILY   vardenafil (LEVITRA) 20 MG tablet Take 0.5-1 tablets (10-20 mg total) by mouth daily as needed.   benzonatate (TESSALON) 100 MG capsule Take 1 capsule (100 mg total) by mouth 3 (three) times daily as needed for cough. (Patient not taking: Reported on 04/07/2022)   No facility-administered encounter medications on file as of 05/07/2022.    Allergies (verified) Benazepril hcl   History: Past Medical History:  Diagnosis Date   Atrial fibrillation (HCC)    Colon polyps    Dr PSharlett Iles  GERD (gastroesophageal reflux disease)    HTN (hypertension)    Hyperlipidemia    OSA on CPAP    Prostatitis    Past Surgical History:  Procedure Laterality Date   [place]  HERNIA REPAIR  1975   ROTATOR CUFF REPAIR  2008   left; Dr WPara March  Family History  Problem Relation Age of Onset   Coronary artery disease Mother    Heart disease Mother 857      MI   Hypertension Other    Heart disease Father 820      MI  Stroke Sister        Cranial anuerysm   Asthma Son    Colon cancer Neg Hx    Stomach cancer Neg Hx    Rectal cancer Neg Hx    Esophageal cancer Neg Hx    Social History   Socioeconomic History   Marital status: Married    Spouse name: Pamala Hurry   Number of children: 3   Years of education: college   Highest education level: Not on file  Occupational History   Occupation: Accountant/Retired  Tobacco Use   Smoking status: Never   Smokeless tobacco: Never  Vaping Use   Vaping Use: Never used  Substance and Sexual Activity   Alcohol use: No   Drug use: No   Sexual activity: Yes  Other Topics Concern   Not on file   Social History Narrative   Regular Exercise - Yes, gym            Social Determinants of Health   Financial Resource Strain: Low Risk  (05/07/2022)   Overall Financial Resource Strain (CARDIA)    Difficulty of Paying Living Expenses: Not hard at all  Food Insecurity: No Food Insecurity (05/07/2022)   Hunger Vital Sign    Worried About Running Out of Food in the Last Year: Never true    [place] in the Last Year: Never true  Transportation Needs: No Transportation Needs (05/07/2022)   PRAPARE - Hydrologist (Medical): No    Lack of Transportation (Non-Medical): No  Physical Activity: Sufficiently Active (05/07/2022)   Exercise Vital Sign    Days of Exercise per Week: 5 days    Minutes of Exercise per Session: 30 min  Stress: No Stress Concern Present (05/07/2022)   [place]    Feeling of Stress : Not at all  Social Connections: [place] (05/07/2022)   Social Connection and Isolation Panel [NHANES]    Frequency of Communication with Friends and Family: More than three times a week    Frequency of Social Gatherings with Friends and Family: More than three times a week    Attends Religious Services: More than 4 times per year    Active Member of [employer] or Organizations: Yes    Attends Music therapist: More than 4 times per year    Marital Status: Married    Tobacco Counseling Counseling given: Not Answered   Clinical Intake:  Pre-visit preparation completed: Yes  Pain : No/denies pain     Nutritional Status: BMI 25 -29 Overweight Nutritional Risks: None Diabetes: Yes CBG done?: No (phone visit) Did pt. bring in CBG monitor from home?: No (phone visit)  How often do you need to have someone help you when you read instructions, pamphlets, or other written materials from your doctor or pharmacy?: 1 - Never What is the last grade level you  completed in school?: college  Diabetic?Yes Nutrition Risk Assessment:  Has the patient had any N/V/D within the last 2 months?  No  Does the patient have any non-healing wounds?  No  Has the patient had any unintentional weight loss or weight gain?  No   Diabetes:  Is the patient diabetic?  Yes  If diabetic, was a CBG obtained today?  No , telephone visit Did the patient bring in their glucometer from home?  No , telephone visit How often do you monitor your CBG's? Reports he does not check his blood  sugar; controls his diabetes by diet.   Financial Strains and Diabetes Management:  Are you having any financial strains with the device, your supplies or your medication? No .  Does the patient want to be seen by Chronic Care Management for management of their diabetes?  No  Would the patient like to be referred to a Nutritionist or for Diabetic Management?  No   Diabetic Exams:  Diabetic Eye Exam: Completed Per patient he had an eye exam 10/23 Diabetic Foot Exam: Overdue, Pt has been advised about the importance in completing this exam. Pt is scheduled for diabetic foot exam on deferred to PCP.   Interpreter Needed?: No  Information entered by :: Emelia Loron RN   Activities of Daily Living    05/07/2022   10:53 AM  In your present state of health, do you have any difficulty performing the following activities:  Hearing? 0  Vision? 0  Difficulty concentrating or making decisions? 0  Walking or climbing stairs? 0  Dressing or bathing? 0  Doing errands, shopping? 0  Preparing Food and eating ? N  Using the Toilet? N  In the past six months, have you accidently leaked urine? N  Do you have problems with loss of bowel control? N  Managing your Medications? N  Managing your Finances? N  Housekeeping or managing your Housekeeping? N    Patient Care Team: Plotnikov, Evie Lacks, MD as PCP - General Croitoru, Dani Gobble, MD as PCP - Cardiology (Cardiology) Sable Feil, MD  as Attending Physician (Gastroenterology) Clance, Armando Reichert, MD (Pulmonary Disease) Festus Aloe, MD as Consulting Physician (Urology)  Indicate any recent Medical Services you may have received from other than [facility] providers in the past year (date may be approximate).     Assessment:   This is a routine wellness examination for Pate.  Hearing/Vision screen No results found.  Dietary issues and exercise activities discussed: Current Exercise Habits: Home exercise routine, Time (Minutes): 30, Frequency (Times/Week): 5, Weekly Exercise (Minutes/Week): 150, Intensity: Mild, Exercise limited by: None identified   Goals Addressed             This Visit's Progress    Patient Stated       Maintain current health status by stay physically and socially active.      Depression Screen    05/07/2022   11:02 AM 03/22/2022    4:11 PM 04/28/2021    8:46 AM 04/23/2020    3:02 PM 05/17/2018    8:40 AM 05/12/2017    9:04 AM 11/04/2015    8:08 AM  PHQ 2/9 Scores  PHQ - 2 Score 0 0 0 0 0 0 0  PHQ- 9 Score  0 0        Fall Risk    05/07/2022   10:56 AM 03/22/2022    4:12 PM 04/28/2021    8:46 AM 04/23/2020    3:06 PM 05/17/2018    8:33 AM  [place] in the past year? 0 0 0 0 0  Number falls in past yr: 0 0 0 0   Injury with Fall? 0 0 0 0   Risk for fall due to : No Fall Risks History of fall(s);No Fall Risks No Fall Risks No Fall Risks   Follow up Falls evaluation completed Falls evaluation completed  Falls evaluation completed Falls evaluation completed    [place]:  Any stairs in or around the home? Yes  If  so, are there any without handrails? Yes  Home free of loose throw rugs in walkways, pet beds, electrical cords, etc? Yes  Adequate lighting in your home to reduce risk of falls? Yes   ASSISTIVE DEVICES UTILIZED TO PREVENT FALLS:  Life alert? No  Use of a cane, walker or w/c? No  Grab bars in the bathroom? No  Shower  chair or bench in shower? No  Elevated toilet seat or a handicapped toilet? No   Cognitive Function:        05/07/2022   10:54 AM  6CIT Screen  What Year? 0 points  What month? 0 points  What time? 0 points  Count back from 20 0 points  Months in reverse 0 points  Repeat phrase 0 points  Total Score 0 points    Immunizations Immunization History  Administered Date(s) Administered   Fluad Quad(high Dose 65+) 03/16/2019, 04/24/2020   Hepatitis A 05/11/2006   Influenza Split 05/17/2011, 05/17/2012   Influenza Whole 05/11/2006, 05/23/2007, 05/14/2008, 05/19/2009, 05/14/2010   Influenza, High Dose Seasonal PF 05/12/2017, 05/17/2018   Influenza,inj,Quad PF,6+ Mos 06/07/2013, 05/05/2016   Influenza-Unspecified 04/21/2015   Moderna Covid-19 Vaccine Bivalent Booster 13yr & up 04/16/2021   PFIZER(Purple Top)SARS-COV-2 Vaccination 07/24/2019, 08/14/2019, 04/08/2020   Pneumococcal Conjugate-13 05/17/2008, 10/23/2020   Pneumococcal Polysaccharide-23 11/04/2015   Tdap 06/04/2011   Zoster, Live 12/05/2012    TDAP status: Due, Education has been provided regarding the importance of this vaccine. Advised may receive this vaccine at local pharmacy or Health Dept. Aware to provide a copy of the vaccination record if obtained from local pharmacy or Health Dept. Verbalized acceptance and understanding.  Flu Vaccine status: Due, Education has been provided regarding the importance of this vaccine. Advised may receive this vaccine at local pharmacy or Health Dept. Aware to provide a copy of the vaccination record if obtained from local pharmacy or Health Dept. Verbalized acceptance and understanding.  Pneumococcal vaccine status: Up to date  Covid-19 vaccine status: Information provided on how to obtain vaccines.   Qualifies for Shingles Vaccine? Yes   Zostavax completed No   Shingrix Completed?: No.    Education has been provided regarding the importance of this vaccine. Patient has been  advised to call insurance company to determine out of pocket expense if they have not yet received this vaccine. Advised may also receive vaccine at local pharmacy or Health Dept. Verbalized acceptance and understanding.  Screening Tests Health Maintenance  Topic Date Due   FOOT EXAM  Never done   OPHTHALMOLOGY EXAM  Never done   COVID-19 Vaccine (5 - Pfizer risk series) 06/11/2021   Zoster Vaccines- Shingrix (1 of 2) 06/21/2022 (Originally 11/04/1964)   Diabetic kidney evaluation - Urine ACR  06/25/2022 (Originally 11/04/1963)   INFLUENZA VACCINE  09/26/2022 (Originally 01/26/2022)   TETANUS/TDAP  03/23/2023 (Originally 06/03/2021)   HEMOGLOBIN A1C  07/31/2022   Diabetic kidney evaluation - GFR measurement  01/29/2023   COLONOSCOPY (Pts 45-454yrInsurance coverage will need to be confirmed)  03/31/2023   Medicare Annual Wellness (AWV)  05/08/2023   Pneumonia Vaccine 6534Years old  Completed   Hepatitis C Screening  Completed   HPV VACCINES  Aged Out    Health Maintenance  Health Maintenance Due  Topic Date Due   FOOT EXAM  Never done   OPHTHALMOLOGY EXAM  Never done   COVID-19 Vaccine (5 - Pfizer risk series) 06/11/2021    Colorectal cancer screening: Type of screening: Colonoscopy. Completed 03/30/18. Repeat every 5 years  Lung Cancer Screening: (Low Dose CT Chest recommended if Age 60-80 years, 30 pack-year currently smoking OR have quit w/in 15years.) does not qualify.   Additional Screening:  Hepatitis C Screening: does qualify; Completed 11/04/15  Vision Screening: Recommended annual ophthalmology exams for early detection of glaucoma and other disorders of the eye. Is the patient up to date with their annual eye exam?  Yes  Who is the provider or what is the name of the office in which the patient attends annual eye exams? Patient reports having an eye examination 10/23; he does not remember where If pt is not established with a provider, would they like to be referred to a  provider to establish care?  N/A .   Dental Screening: Recommended annual dental exams for proper oral hygiene  Community Resource Referral / Chronic Care Management: CRR required this visit?  No   CCM required this visit?  No      Plan:     I have personally reviewed and noted the following in the patient's chart:   Medical and social history Use of alcohol, tobacco or illicit drugs  Current medications and supplements including opioid prescriptions. Patient is not currently taking opioid prescriptions. Functional ability and status Nutritional status Physical activity Advanced directives List of other physicians Hospitalizations, surgeries, and ER visits in previous 12 months Vitals Screenings to include cognitive, depression, and falls Referrals and appointments  In addition, I have reviewed and discussed with patient certain preventive protocols, quality metrics, and best practice recommendations. A written personalized care plan for preventive services as well as general preventive health recommendations were provided to patient.     Michiel Cowboy, RN   05/07/2022   Nurse Notes:  William [name] , Thank you for taking time to come for your Medicare Wellness Visit. I appreciate your ongoing commitment to your health goals. Please review the following plan we discussed and let me know if I can assist you in the future.   These are the goals we discussed:  Goals      Patient Stated     Maintain current health status by stay physically and socially active.        This is a list of the screening recommended for you and due dates:  Health Maintenance  Topic Date Due   Complete foot exam   Never done   Eye exam for diabetics  Never done   COVID-19 Vaccine (5 - Pfizer risk series) 06/11/2021   Zoster (Shingles) Vaccine (1 of 2) 06/21/2022*   Yearly kidney health urinalysis for diabetes  06/25/2022*   Flu Shot  09/26/2022*   Tetanus Vaccine  03/23/2023*   Hemoglobin A1C   07/31/2022   Yearly kidney function blood test for diabetes  01/29/2023   Colon Cancer Screening  03/31/2023   Medicare Annual Wellness Visit  05/08/2023   Pneumonia Vaccine  Completed   Hepatitis C Screening: USPSTF Recommendation to screen - Ages 18-79 yo.  Completed   HPV Vaccine  Aged Out  *Topic was postponed. The date shown is not the original due date.     Medical screening examination/treatment/procedure(s) were performed by non-physician practitioner and as supervising physician I was immediately available for consultation/collaboration.  I agree with above. Lew Dawes, MD

## 2022-05-06 NOTE — Patient Instructions (Signed)
Health Maintenance, Male Adopting a healthy lifestyle and getting preventive care are important in promoting health and wellness. Ask your health care provider about: The right schedule for you to have regular tests and exams. Things you can do on your own to prevent diseases and keep yourself healthy. What should I know about diet, weight, and exercise? Eat a healthy diet  Eat a diet that includes plenty of vegetables, fruits, low-fat dairy products, and lean protein. Do not eat a lot of foods that are high in solid fats, added sugars, or sodium. Maintain a healthy weight Body mass index (BMI) is a measurement that can be used to identify possible weight problems. It estimates body fat based on height and weight. Your health care provider can help determine your BMI and help you achieve or maintain a healthy weight. Get regular exercise Get regular exercise. This is one of the most important things you can do for your health. Most adults should: Exercise for at least 150 minutes each week. The exercise should increase your heart rate and make you sweat (moderate-intensity exercise). Do strengthening exercises at least twice a week. This is in addition to the moderate-intensity exercise. Spend less time sitting. Even light physical activity can be beneficial. Watch cholesterol and blood lipids Have your blood tested for lipids and cholesterol at 75 years of age, then have this test every 5 years. You may need to have your cholesterol levels checked more often if: Your lipid or cholesterol levels are high. You are older than 75 years of age. You are at high risk for heart disease. What should I know about cancer screening? Many types of cancers can be detected early and may often be prevented. Depending on your health history and family history, you may need to have cancer screening at various ages. This may include screening for: Colorectal cancer. Prostate cancer. Skin cancer. Lung  cancer. What should I know about heart disease, diabetes, and high blood pressure? Blood pressure and heart disease High blood pressure causes heart disease and increases the risk of stroke. This is more likely to develop in people who have high blood pressure readings or are overweight. Talk with your health care provider about your target blood pressure readings. Have your blood pressure checked: Every 3-5 years if you are 18-39 years of age. Every year if you are 40 years old or older. If you are between the ages of 65 and 75 and are a current or former smoker, ask your health care provider if you should have a one-time screening for abdominal aortic aneurysm (AAA). Diabetes Have regular diabetes screenings. This checks your fasting blood sugar level. Have the screening done: Once every three years after age 45 if you are at a normal weight and have a low risk for diabetes. More often and at a younger age if you are overweight or have a high risk for diabetes. What should I know about preventing infection? Hepatitis B If you have a higher risk for hepatitis B, you should be screened for this virus. Talk with your health care provider to find out if you are at risk for hepatitis B infection. Hepatitis C Blood testing is recommended for: Everyone born from 1945 through 1965. Anyone with known risk factors for hepatitis C. Sexually transmitted infections (STIs) You should be screened each year for STIs, including gonorrhea and chlamydia, if: You are sexually active and are younger than 75 years of age. You are older than 75 years of age and your   health care provider tells you that you are at risk for this type of infection. Your sexual activity has changed since you were last screened, and you are at increased risk for chlamydia or gonorrhea. Ask your health care provider if you are at risk. Ask your health care provider about whether you are at high risk for HIV. Your health care provider  may recommend a prescription medicine to help prevent HIV infection. If you choose to take medicine to prevent HIV, you should first get tested for HIV. You should then be tested every 3 months for as long as you are taking the medicine. Follow these instructions at home: Alcohol use Do not drink alcohol if your health care provider tells you not to drink. If you drink alcohol: Limit how much you have to 0-2 drinks a day. Know how much alcohol is in your drink. In the [place] one drink equals one 12 oz bottle of beer (355 mL), one 5 oz glass of Lyndee Herbst (148 mL), or one 1 oz glass of hard liquor (44 mL). Lifestyle Do not use any products that contain nicotine or tobacco. These products include cigarettes, chewing tobacco, and vaping devices, such as e-cigarettes. If you need help quitting, ask your health care provider. Do not use street drugs. Do not share needles. Ask your health care provider for help if you need support or information about quitting drugs. General instructions Schedule regular health, dental, and eye exams. Stay current with your vaccines. Tell your health care provider if: You often feel depressed. You have ever been abused or do not feel safe at home. Summary Adopting a healthy lifestyle and getting preventive care are important in promoting health and wellness. Follow your health care provider's instructions about healthy diet, exercising, and getting tested or screened for diseases. Follow your health care provider's instructions on monitoring your cholesterol and blood pressure. This information is not intended to replace advice given to you by your health care provider. Make sure you discuss any questions you have with your health care provider. Document Revised: 11/03/2020 Document Reviewed: 11/03/2020 Elsevier Patient Education  2023 [employer]  

## 2022-05-07 ENCOUNTER — Ambulatory Visit (INDEPENDENT_AMBULATORY_CARE_PROVIDER_SITE_OTHER): Payer: Medicare Other | Admitting: *Deleted

## 2022-05-07 DIAGNOSIS — Z0000 Encounter for general adult medical examination without abnormal findings: Secondary | ICD-10-CM | POA: Diagnosis not present

## 2022-05-11 ENCOUNTER — Encounter: Payer: Self-pay | Admitting: Internal Medicine

## 2022-05-11 ENCOUNTER — Ambulatory Visit (INDEPENDENT_AMBULATORY_CARE_PROVIDER_SITE_OTHER): Payer: Medicare Other | Admitting: Internal Medicine

## 2022-05-11 VITALS — BP 116/68 | HR 70 | Temp 97.6°F | Ht 66.0 in | Wt 186.0 lb

## 2022-05-11 DIAGNOSIS — R739 Hyperglycemia, unspecified: Secondary | ICD-10-CM

## 2022-05-11 DIAGNOSIS — Z0000 Encounter for general adult medical examination without abnormal findings: Secondary | ICD-10-CM

## 2022-05-11 DIAGNOSIS — E785 Hyperlipidemia, unspecified: Secondary | ICD-10-CM | POA: Diagnosis not present

## 2022-05-11 DIAGNOSIS — N320 Bladder-neck obstruction: Secondary | ICD-10-CM | POA: Diagnosis not present

## 2022-05-11 DIAGNOSIS — E119 Type 2 diabetes mellitus without complications: Secondary | ICD-10-CM

## 2022-05-11 DIAGNOSIS — Z23 Encounter for immunization: Secondary | ICD-10-CM | POA: Diagnosis not present

## 2022-05-11 LAB — LIPID PANEL
Cholesterol: 124 mg/dL (ref 0–200)
HDL: 50.8 mg/dL (ref >39.00)
LDL Cholesterol: 58 mg/dL (ref 0–99)
NonHDL: 73.31
Total CHOL/HDL Ratio: 2
Triglycerides: 75 mg/dL (ref 0.0–149.0)
VLDL: 15 mg/dL (ref 0.0–40.0)

## 2022-05-11 LAB — CBC WITH DIFFERENTIAL/PLATELET
Basophils Absolute: 0 10*3/uL (ref 0.0–0.1)
Basophils Relative: 0.5 % (ref 0.0–3.0)
Eosinophils Absolute: 0.2 10*3/uL (ref 0.0–0.7)
Eosinophils Relative: 4.2 % (ref 0.0–5.0)
HCT: 44 % (ref 39.0–52.0)
Hemoglobin: 14.9 g/dL (ref 13.0–17.0)
Lymphocytes Relative: 23.3 % (ref 12.0–46.0)
Lymphs Abs: 1.3 10*3/uL (ref 0.7–4.0)
MCHC: 33.9 g/dL (ref 30.0–36.0)
MCV: 88.5 fl (ref 78.0–100.0)
Monocytes Absolute: 0.4 10*3/uL (ref 0.1–1.0)
Monocytes Relative: 7.5 % (ref 3.0–12.0)
Neutro Abs: 3.7 10*3/uL (ref 1.4–7.7)
Neutrophils Relative %: 64.5 % (ref 43.0–77.0)
Platelets: 150 10*3/uL (ref 150.0–400.0)
RBC: 4.98 Mil/uL (ref 4.22–5.81)
RDW: 14.7 % (ref 11.5–15.5)
WBC: 5.7 10*3/uL (ref 4.0–10.5)

## 2022-05-11 LAB — COMPREHENSIVE METABOLIC PANEL
ALT: 46 U/L (ref 0–53)
AST: 35 U/L (ref 0–37)
Albumin: 4.5 g/dL (ref 3.5–5.2)
Alkaline Phosphatase: 109 U/L (ref 39–117)
BUN: 19 mg/dL (ref 6–23)
CO2: 34 mEq/L — ABNORMAL HIGH (ref 19–32)
Calcium: 9 mg/dL (ref 8.4–10.5)
Chloride: 99 mEq/L (ref 96–112)
Creatinine, Ser: 1.1 mg/dL (ref 0.40–1.50)
GFR: 65.66 mL/min (ref >60.00)
Glucose, Bld: 129 mg/dL — ABNORMAL HIGH (ref 70–99)
Potassium: 3.5 mEq/L (ref 3.5–5.1)
Sodium: 139 mEq/L (ref 135–145)
Total Bilirubin: 0.8 mg/dL (ref 0.2–1.2)
Total Protein: 7 g/dL (ref 6.0–8.3)

## 2022-05-11 LAB — URINALYSIS
Bilirubin Urine: NEGATIVE
Hgb urine dipstick: NEGATIVE
Ketones, ur: NEGATIVE
Leukocytes,Ua: NEGATIVE
Nitrite: NEGATIVE
Specific Gravity, Urine: 1.02 (ref 1.000–1.030)
Total Protein, Urine: NEGATIVE
Urine Glucose: >=1000 — AB
Urobilinogen, UA: 1 (ref 0.0–1.0)
pH: 6 (ref 5.0–8.0)

## 2022-05-11 LAB — TSH: TSH: 2.59 u[IU]/mL (ref 0.35–5.50)

## 2022-05-11 LAB — HEMOGLOBIN A1C: Hgb A1c MFr Bld: 7 % — ABNORMAL HIGH (ref 4.6–6.5)

## 2022-05-11 LAB — PSA: PSA: 2.8 ng/mL (ref 0.10–4.00)

## 2022-05-11 NOTE — Progress Notes (Signed)
Subjective:  Patient ID: William [name]., male    DOB: August 07, 1945  Age: 76 y.o. MRN: [id number]  CC: Annual Exam   HPI William [name]. presents for a well exam  Outpatient Medications Prior to Visit  Medication Sig Dispense Refill   apixaban (ELIQUIS) 5 MG TABS tablet Take 1 tablet (5 mg total) by mouth 2 (two) times daily. 120 tablet 5   atorvastatin (LIPITOR) 80 MG tablet TAKE 1 TABLET BY MOUTH ONCE  DAILY 90 tablet 3   benzonatate (TESSALON) 100 MG capsule Take 1 capsule (100 mg total) by mouth 3 (three) times daily as needed for cough. 20 capsule 0   Cholecalciferol (VITAMIN D3) 50 MCG (2000 UT) capsule Take 1 capsule (2,000 Units total) by mouth daily. 100 capsule 3   clotrimazole-betamethasone (LOTRISONE) cream APPLY TO AFFECTED AREA EVERY DAY AS NEEDED 45 g 1   dapagliflozin propanediol (FARXIGA) 5 MG TABS tablet Take 1 tablet (5 mg total) by mouth daily before breakfast. 90 tablet 3   ezetimibe (ZETIA) 10 MG tablet TAKE 1 TABLET BY MOUTH DAILY 90 tablet 3   metoprolol tartrate (LOPRESSOR) 25 MG tablet Take 1 tablet (25 mg total) by mouth 3 (three) times daily as needed (rapid rates with afib). 30 tablet 3   potassium chloride SA (KLOR-CON M) 20 MEQ tablet Take 1 tablet (20 mEq total) by mouth daily. Annual appt due in Nov must see provider for future refills 90 tablet 0   tamsulosin (FLOMAX) 0.4 MG CAPS capsule Take 0.4 mg by mouth.     triamcinolone cream (KENALOG) 0.1 % APPLY TO AFFECTED AREA TWICE A DAY     valsartan-hydrochlorothiazide (DIOVAN-HCT) 160-25 MG tablet TAKE 1 TABLET BY MOUTH  DAILY 90 tablet 1   vardenafil (LEVITRA) 20 MG tablet Take 0.5-1 tablets (10-20 mg total) by mouth daily as needed. 30 tablet 3   No facility-administered medications prior to visit.    ROS: Review of Systems  Constitutional:  Negative for appetite change, fatigue and unexpected weight change.  HENT:  Negative for congestion, nosebleeds, sneezing, sore throat and trouble  swallowing.   Eyes:  Negative for itching and visual disturbance.  Respiratory:  Negative for cough.   Cardiovascular:  Negative for chest pain, palpitations and leg swelling.  Gastrointestinal:  Negative for abdominal distention, blood in stool, diarrhea and nausea.  Genitourinary:  Negative for frequency and hematuria.  Musculoskeletal:  Negative for back pain, gait problem, joint swelling and neck pain.  Skin:  Negative for rash.  Neurological:  Negative for dizziness, tremors, speech difficulty and weakness.  Hematological:  Does not bruise/bleed easily.  Psychiatric/Behavioral:  Negative for agitation, dysphoric mood, sleep disturbance and suicidal ideas. The patient is not nervous/anxious.     Objective:  BP 116/68 (BP Location: Right Arm, Patient Position: Sitting, Cuff Size: Normal)   Pulse 70   Temp 97.6 F (36.4 C) (Oral)   Ht '5\' 6"'$  (1.676 m)   Wt 186 lb (84.4 kg)   SpO2 94%   BMI 30.02 kg/m   BP Readings from Last 3 Encounters:  05/11/22 116/68  04/07/22 132/86  01/28/22 110/68    Wt Readings from Last 3 Encounters:  05/11/22 186 lb (84.4 kg)  04/07/22 185 lb (83.9 kg)  03/22/22 180 lb (81.6 kg)    Physical Exam Constitutional:      General: He is not in acute distress.    Appearance: He is well-developed. He is obese.     Comments: NAD  Eyes:     Conjunctiva/sclera: Conjunctivae normal.     Pupils: Pupils are equal, round, and reactive to light.  Neck:     Thyroid: No thyromegaly.     Vascular: No JVD.  Cardiovascular:     Rate and Rhythm: Normal rate and regular rhythm.     Heart sounds: Normal heart sounds. No murmur heard.    No friction rub. No gallop.  Pulmonary:     Effort: Pulmonary effort is normal. No respiratory distress.     Breath sounds: Normal breath sounds. No wheezing or rales.  Chest:     Chest wall: No tenderness.  Abdominal:     General: Bowel sounds are normal. There is no distension.     Palpations: Abdomen is soft. There is  no mass.     Tenderness: There is no abdominal tenderness. There is no guarding or rebound.  Musculoskeletal:        General: No tenderness. Normal range of motion.     Cervical back: Normal range of motion.  Lymphadenopathy:     Cervical: No cervical adenopathy.  Skin:    General: Skin is warm and dry.     Findings: No rash.  Neurological:     Mental Status: He is alert and oriented to person, place, and time.     Cranial Nerves: No cranial nerve deficit.     Motor: No abnormal muscle tone.     Coordination: Coordination normal.     Gait: Gait normal.     Deep Tendon Reflexes: Reflexes are normal and symmetric.  Psychiatric:        Behavior: Behavior normal.        Thought Content: Thought content normal.        Judgment: Judgment normal.   Rectal - per GI pending  Lab Results  Component Value Date   WBC 6.2 04/28/2021   HGB 15.2 04/28/2021   HCT 44.2 04/28/2021   PLT 185.0 04/28/2021   GLUCOSE 114 (H) 01/28/2022   CHOL 138 10/27/2021   TRIG 89.0 10/27/2021   HDL 58.40 10/27/2021   LDLCALC 62 10/27/2021   ALT 51 01/28/2022   AST 32 01/28/2022   NA 142 01/28/2022   K 3.8 01/28/2022   CL 101 01/28/2022   CREATININE 0.96 01/28/2022   BUN 17 01/28/2022   CO2 34 (H) 01/28/2022   TSH 2.58 04/28/2021   PSA 2.30 04/28/2021   HGBA1C 6.8 (H) 01/28/2022    CT CORONARY MORPH W/CTA COR W/SCORE W/CA W/CM &/OR WO/CM  Addendum Date: 10/24/2017   ADDENDUM REPORT: 10/24/2017 17:04 CLINICAL DATA:  9 -year-old male with PAF and abnormal exercise treadmill stress test. EXAM: Cardiac/Coronary  CT TECHNIQUE: The patient was scanned on a [employer]. FINDINGS: A 120 kV prospective scan was triggered in the descending thoracic aorta at 111 HU's. Axial non-contrast 3 mm slices were carried out through the heart. The data set was analyzed on a dedicated work station and scored using the [place]. Gantry rotation speed was 250 msecs and collimation was .6 mm. 5 mg of iv  Metoprolol and 0.8 mg of sl NTG was given. The 3D data set was reconstructed in 5% intervals of the 67-82 % of the R-R cycle. Diastolic phases were analyzed on a dedicated work station using MPR, MIP and VRT modes. The patient received 80 cc of contrast. Aorta: Normal size. Mild calcifications in the descending aorta. No dissection. Aortic Valve:  Trileaflet.  No calcifications. Coronary Arteries:  Normal  coronary origin.  Right dominance. RCA is a large dominant artery that gives rise to PDA and PLVB. There is minimal calcified plaque with associated stenosis 0-25%. Left main is a large artery that gives rise to LAD and LCX arteries. Distal left main has a mild calcified plaque with stenosis 0-25%. LAD is a large vessel that gives rise to two small diagonal arteries. There is mild mixed diffuse plaque with stenosis 0-25%. 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 LCX is a medium size non-dominant artery. Ostial LCX artery has mild calcified plaque with associated stenosis 25-50%. Mid LCX artery has mixed plaque with stenosis 50-69%. Other findings: Normal pulmonary vein drainage into the left atrium. Normal let atrial appendage without a thrombus. Normal size of the pulmonary artery. IMPRESSION: 1. Coronary calcium score of 262. This was 52 percentile for age and sex matched control. 2. Normal coronary origin with right dominance. 3. 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 Mid LCX artery has mixed plaque with stenosis 50-69%. Additional analysis with CT FFR will be submitted. 4. 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 Electronically Signed   By: Ena Dawley   On: 10/24/2017 17:04   Result Date: 10/24/2017 EXAM: OVER-READ INTERPRETATION  CT CHEST The following report is an over-read performed by radiologist Dr. Rolm Baptise of [facility], [place] on 10/24/2017. This over-read does not include interpretation of cardiac or coronary anatomy  or pathology. The coronary CTA interpretation by the cardiologist is attached. COMPARISON:  None. FINDINGS: Vascular: Heart is normal size.  Visualized aorta normal caliber. Mediastinum/Nodes: No adenopathy in the lower mediastinum or hila. Lungs/Pleura: Dependent atelectasis in the lower lobes. No effusions. Upper Abdomen: Imaging into the upper abdomen shows no acute findings. Musculoskeletal: Chest wall soft tissues are unremarkable. No acute bony abnormality. IMPRESSION: No acute or significant extracardiac abnormality. Electronically Signed: By: Rolm Baptise M.D. On: 10/24/2017 14:11    Assessment & Plan:   Problem List Items Addressed This Visit     Dyslipidemia   Relevant Orders   TSH   Urinalysis   CBC with Differential/Platelet   Lipid panel   Hyperglycemia   DM (diabetes mellitus), type 2 (HCC)   Relevant Orders   Comprehensive metabolic panel   Hemoglobin A1c   Well adult exam - Primary      We discussed age appropriate health related issues, including available/recomended screening tests and vaccinations. Labs were ordered to be later reviewed . All questions were answered. We discussed one or more of the following - seat belt use, use of sunscreen/sun exposure exercise, fall risk reduction, second hand smoke exposure, firearm use and storage, seat belt use, a need for adhering to healthy diet and exercise. Labs were ordered.  All questions were answered. Get a tDAP Last colon 2019 Dr Loletha Carrow, due in 2024      Relevant Orders   TSH   Urinalysis   CBC with Differential/Platelet   Lipid panel   PSA   Comprehensive metabolic panel   Hemoglobin A1c   Other Visit Diagnoses     Bladder neck obstruction       Relevant Orders   PSA         No orders of the defined types were placed in this encounter.     Follow-up: Return in about 6 months (around 11/09/2022) for a follow-up visit.  Walker Kehr, MD

## 2022-05-11 NOTE — Assessment & Plan Note (Addendum)
   We discussed age appropriate health related issues, including available/recomended screening tests and vaccinations. Labs were ordered to be later reviewed . All questions were answered. We discussed one or more of the following - seat belt use, use of sunscreen/sun exposure exercise, fall risk reduction, second hand smoke exposure, firearm use and storage, seat belt use, a need for adhering to healthy diet and exercise. Labs were ordered.  All questions were answered. Get a tDAP Last colon 2019 Dr Loletha Carrow, due in 2024

## 2022-05-11 NOTE — Addendum Note (Signed)
Addended by: Basil Dess on: 05/11/2022 09:26 AM   Modules accepted: Orders

## 2022-05-12 ENCOUNTER — Other Ambulatory Visit: Payer: Self-pay | Admitting: Internal Medicine

## 2022-05-12 MED ORDER — DAPAGLIFLOZIN PROPANEDIOL 10 MG PO TABS: 10.0000 mg | ORAL_TABLET | Freq: Every day | ORAL | 3 refills | Status: DC

## 2022-05-18 ENCOUNTER — Other Ambulatory Visit: Payer: Self-pay | Admitting: Internal Medicine

## 2022-05-30 ENCOUNTER — Other Ambulatory Visit: Payer: Self-pay | Admitting: Internal Medicine

## 2022-06-15 ENCOUNTER — Encounter: Payer: Self-pay | Admitting: Internal Medicine

## 2022-11-01 ENCOUNTER — Encounter: Payer: Self-pay | Admitting: Internal Medicine

## 2022-11-01 ENCOUNTER — Ambulatory Visit (INDEPENDENT_AMBULATORY_CARE_PROVIDER_SITE_OTHER): Payer: Medicare Other | Admitting: Internal Medicine

## 2022-11-01 VITALS — BP 110/68 | HR 76 | Temp 98.0°F | Ht 66.0 in | Wt 181.0 lb

## 2022-11-01 DIAGNOSIS — I10 Essential (primary) hypertension: Secondary | ICD-10-CM

## 2022-11-01 DIAGNOSIS — E119 Type 2 diabetes mellitus without complications: Secondary | ICD-10-CM | POA: Diagnosis not present

## 2022-11-01 DIAGNOSIS — Z7984 Long term (current) use of oral hypoglycemic drugs: Secondary | ICD-10-CM

## 2022-11-01 DIAGNOSIS — Z7901 Long term (current) use of anticoagulants: Secondary | ICD-10-CM | POA: Diagnosis not present

## 2022-11-01 DIAGNOSIS — I480 Paroxysmal atrial fibrillation: Secondary | ICD-10-CM

## 2022-11-01 LAB — LIPID PANEL
Cholesterol: 126 mg/dL (ref 0–200)
HDL: 55.5 mg/dL (ref >39.00)
LDL Cholesterol: 52 mg/dL (ref 0–99)
NonHDL: 70.51
Total CHOL/HDL Ratio: 2
Triglycerides: 94 mg/dL (ref 0.0–149.0)
VLDL: 18.8 mg/dL (ref 0.0–40.0)

## 2022-11-01 LAB — MICROALBUMIN / CREATININE URINE RATIO
Creatinine,U: 70.6 mg/dL
Microalb Creat Ratio: 1.2 mg/g (ref 0.0–30.0)
Microalb, Ur: 0.8 mg/dL (ref 0.0–1.9)

## 2022-11-01 LAB — COMPREHENSIVE METABOLIC PANEL
ALT: 41 U/L (ref 0–53)
AST: 33 U/L (ref 0–37)
Albumin: 4.6 g/dL (ref 3.5–5.2)
Alkaline Phosphatase: 101 U/L (ref 39–117)
BUN: 16 mg/dL (ref 6–23)
CO2: 33 mEq/L — ABNORMAL HIGH (ref 19–32)
Calcium: 9.4 mg/dL (ref 8.4–10.5)
Chloride: 99 mEq/L (ref 96–112)
Creatinine, Ser: 1.05 mg/dL (ref 0.40–1.50)
GFR: 69.2 mL/min (ref >60.00)
Glucose, Bld: 116 mg/dL — ABNORMAL HIGH (ref 70–99)
Potassium: 4 mEq/L (ref 3.5–5.1)
Sodium: 140 mEq/L (ref 135–145)
Total Bilirubin: 1.5 mg/dL — ABNORMAL HIGH (ref 0.2–1.2)
Total Protein: 7.5 g/dL (ref 6.0–8.3)

## 2022-11-01 LAB — HEMOGLOBIN A1C: Hgb A1c MFr Bld: 6.7 % — ABNORMAL HIGH (ref 4.6–6.5)

## 2022-11-01 NOTE — Assessment & Plan Note (Signed)
Cont on Diovan-HCT

## 2022-11-01 NOTE — Assessment & Plan Note (Signed)
Cont on Eliquis- 

## 2022-11-01 NOTE — Addendum Note (Signed)
Addended by: Delsa Grana R on: 11/01/2022 10:49 AM   Modules accepted: Orders

## 2022-11-01 NOTE — Progress Notes (Signed)
Subjective:  Patient ID: [name], male    DOB: 1945/12/19  Age: 77 y.o. MRN: [id number]  CC: Follow-up (6 mnth f/u)   HPI William [name]. presents for DM, CAD, HTN  Outpatient Medications Prior to Visit  Medication Sig Dispense Refill   apixaban (ELIQUIS) 5 MG TABS tablet Take 1 tablet (5 mg total) by mouth 2 (two) times daily. 120 tablet 5   atorvastatin (LIPITOR) 80 MG tablet TAKE 1 TABLET BY MOUTH ONCE  DAILY 90 tablet 3   Cholecalciferol (VITAMIN D3) 50 MCG (2000 UT) capsule Take 1 capsule (2,000 Units total) by mouth daily. 100 capsule 3   clotrimazole-betamethasone (LOTRISONE) cream APPLY TO AFFECTED AREA EVERY DAY AS NEEDED 45 g 1   dapagliflozin propanediol (FARXIGA) 10 MG TABS tablet Take 1 tablet (10 mg total) by mouth daily before breakfast. 90 tablet 3   ezetimibe (ZETIA) 10 MG tablet TAKE 1 TABLET BY MOUTH DAILY 90 tablet 3   potassium chloride SA (KLOR-CON M) 20 MEQ tablet Take 1 tablet (20 mEq total) by mouth daily. 90 tablet 3   tamsulosin (FLOMAX) 0.4 MG CAPS capsule Take 0.4 mg by mouth.     triamcinolone cream (KENALOG) 0.1 % APPLY TO AFFECTED AREA TWICE A DAY     valsartan-hydrochlorothiazide (DIOVAN-HCT) 160-25 MG tablet TAKE 1 TABLET BY MOUTH DAILY 90 tablet 3   metoprolol tartrate (LOPRESSOR) 25 MG tablet Take 1 tablet (25 mg total) by mouth 3 (three) times daily as needed (rapid rates with afib). 30 tablet 3   benzonatate (TESSALON) 100 MG capsule Take 1 capsule (100 mg total) by mouth 3 (three) times daily as needed for cough. 20 capsule 0   vardenafil (LEVITRA) 20 MG tablet Take 0.5-1 tablets (10-20 mg total) by mouth daily as needed. 30 tablet 3   No facility-administered medications prior to visit.    ROS: Review of Systems  Constitutional:  Negative for appetite change, fatigue and unexpected weight change.  HENT:  Negative for congestion, nosebleeds, sneezing, sore throat and trouble swallowing.   Eyes:  Negative for itching and visual  disturbance.  Respiratory:  Negative for cough.   Cardiovascular:  Negative for chest pain, palpitations and leg swelling.  Gastrointestinal:  Negative for abdominal distention, blood in stool, diarrhea and nausea.  Genitourinary:  Negative for frequency and hematuria.  Musculoskeletal:  Negative for back pain, gait problem, joint swelling and neck pain.  Skin:  Negative for rash.  Neurological:  Negative for dizziness, tremors, speech difficulty and weakness.  Psychiatric/Behavioral:  Negative for agitation, dysphoric mood and sleep disturbance. The patient is not nervous/anxious.     Objective:  BP 110/68 (BP Location: Left Arm, Patient Position: Sitting, Cuff Size: Large)   Pulse 76   Temp 98 F (36.7 C) (Oral)   Ht 5\' 6"  (1.676 m)   Wt 181 lb (82.1 kg)   SpO2 92%   BMI 29.21 kg/m   BP Readings from Last 3 Encounters:  11/01/22 110/68  05/11/22 116/68  04/07/22 132/86    Wt Readings from Last 3 Encounters:  11/01/22 181 lb (82.1 kg)  05/11/22 186 lb (84.4 kg)  04/07/22 185 lb (83.9 kg)    Physical Exam Constitutional:      General: He is not in acute distress.    Appearance: He is well-developed.     Comments: NAD  Eyes:     Conjunctiva/sclera: Conjunctivae normal.     Pupils: Pupils are equal, round, and reactive to light.  Neck:     Thyroid: No thyromegaly.     Vascular: No JVD.  Cardiovascular:     Rate and Rhythm: Normal rate and regular rhythm.     Heart sounds: Normal heart sounds. No murmur heard.    No friction rub. No gallop.  Pulmonary:     Effort: Pulmonary effort is normal. No respiratory distress.     Breath sounds: Normal breath sounds. No wheezing or rales.  Chest:     Chest wall: No tenderness.  Abdominal:     General: Bowel sounds are normal. There is no distension.     Palpations: Abdomen is soft. There is no mass.     Tenderness: There is no abdominal tenderness. There is no guarding or rebound.  Musculoskeletal:        General: No  tenderness. Normal range of motion.     Cervical back: Normal range of motion.  Lymphadenopathy:     Cervical: No cervical adenopathy.  Skin:    General: Skin is warm and dry.     Findings: No rash.  Neurological:     Mental Status: He is alert and oriented to person, place, and time.     Cranial Nerves: No cranial nerve deficit.     Motor: No abnormal muscle tone.     Coordination: Coordination normal.     Gait: Gait normal.     Deep Tendon Reflexes: Reflexes are normal and symmetric.  Psychiatric:        Behavior: Behavior normal.        Thought Content: Thought content normal.        Judgment: Judgment normal.     Lab Results  Component Value Date   WBC 5.7 05/11/2022   HGB 14.9 05/11/2022   HCT 44.0 05/11/2022   PLT 150.0 05/11/2022   GLUCOSE 129 (H) 05/11/2022   CHOL 124 05/11/2022   TRIG 75.0 05/11/2022   HDL 50.80 05/11/2022   LDLCALC 58 05/11/2022   ALT 46 05/11/2022   AST 35 05/11/2022   NA 139 05/11/2022   K 3.5 05/11/2022   CL 99 05/11/2022   CREATININE 1.10 05/11/2022   BUN 19 05/11/2022   CO2 34 (H) 05/11/2022   TSH 2.59 05/11/2022   PSA 2.80 05/11/2022   HGBA1C 7.0 (H) 05/11/2022    CT CORONARY MORPH W/CTA COR W/SCORE W/CA W/CM &/OR WO/CM  Addendum Date: 10/24/2017   ADDENDUM REPORT: 10/24/2017 17:04 CLINICAL DATA:  75 -year-old male with PAF and abnormal exercise treadmill stress test. EXAM: Cardiac/Coronary  CT TECHNIQUE: The patient was scanned on a [employer]. FINDINGS: A 120 kV prospective scan was triggered in the descending thoracic aorta at 111 HU's. Axial non-contrast 3 mm slices were carried out through the heart. The data set was analyzed on a dedicated work station and scored using the [place] method. Gantry rotation speed was 250 msecs and collimation was .6 mm. 5 mg of iv Metoprolol and 0.8 mg of sl NTG was given. The 3D data set was reconstructed in 5% intervals of the 67-82 % of the R-R cycle. Diastolic phases were analyzed on a  dedicated work station using MPR, MIP and VRT modes. The patient received 80 cc of contrast. Aorta: Normal size. Mild calcifications in the descending aorta. No dissection. Aortic Valve:  Trileaflet.  No calcifications. Coronary Arteries:  Normal coronary origin.  Right dominance. RCA is a large dominant artery that gives rise to PDA and PLVB. There is minimal calcified plaque with associated stenosis  0-25%. Left main is a large artery that gives rise to LAD and LCX arteries. Distal left main has a mild calcified plaque with stenosis 0-25%. LAD is a large vessel that gives rise to two small diagonal arteries. There is mild mixed diffuse plaque with stenosis 0-25%. 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 LCX is a medium size non-dominant artery. Ostial LCX artery has mild calcified plaque with associated stenosis 25-50%. Mid LCX artery has mixed plaque with stenosis 50-69%. Other findings: Normal pulmonary vein drainage into the left atrium. Normal let atrial appendage without a thrombus. Normal size of the pulmonary artery. IMPRESSION: 1. Coronary calcium score of 262. This was 63 percentile for age and sex matched control. 2. Normal coronary origin with right dominance. 3. 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 Mid LCX artery has mixed plaque with stenosis 50-69%. Additional analysis with CT FFR will be submitted. 4. 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 Electronically Signed   By: Tobias Alexander   On: 10/24/2017 17:04   Result Date: 10/24/2017 EXAM: OVER-READ INTERPRETATION  CT CHEST The following report is an over-read performed by radiologist Dr. Charlett Nose of [facility] on 10/24/2017. This over-read does not include interpretation of cardiac or coronary anatomy or pathology. The coronary CTA interpretation by the cardiologist is attached. COMPARISON:  None. FINDINGS: Vascular: Heart is normal size.  Visualized aorta  normal caliber. Mediastinum/Nodes: No adenopathy in the lower mediastinum or hila. Lungs/Pleura: Dependent atelectasis in the lower lobes. No effusions. Upper Abdomen: Imaging into the upper abdomen shows no acute findings. Musculoskeletal: Chest wall soft tissues are unremarkable. No acute bony abnormality. IMPRESSION: No acute or significant extracardiac abnormality. Electronically Signed: By: Charlett Nose M.D. On: 10/24/2017 14:11    Assessment & Plan:   Problem List Items Addressed This Visit     Essential hypertension    Cont on Diovan-HCT      Paroxysmal atrial fibrillation (HCC)    No relapse in a few months      Chronic anticoagulation    Cont on Eliquis      DM (diabetes mellitus), type 2 (HCC) - Primary    On Farxiga         No orders of the defined types were placed in this encounter.     Follow-up: Return in about 6 months (around 05/04/2023) for Wellness Exam.  Sonda Primes, MD

## 2022-11-01 NOTE — Assessment & Plan Note (Signed)
On  Farxiga 

## 2022-11-01 NOTE — Assessment & Plan Note (Signed)
No relapse in a few months

## 2023-03-02 ENCOUNTER — Other Ambulatory Visit: Payer: Self-pay | Admitting: Internal Medicine

## 2023-03-04 ENCOUNTER — Other Ambulatory Visit: Payer: Self-pay | Admitting: Cardiovascular Disease

## 2023-03-23 ENCOUNTER — Other Ambulatory Visit: Payer: Self-pay | Admitting: Cardiovascular Disease

## 2023-03-23 ENCOUNTER — Other Ambulatory Visit: Payer: Self-pay | Admitting: Internal Medicine

## 2023-03-23 DIAGNOSIS — I480 Paroxysmal atrial fibrillation: Secondary | ICD-10-CM

## 2023-03-24 NOTE — Telephone Encounter (Signed)
Prescription refill request for Eliquis received. Indication: Afib  Last office visit: 04/07/22 (Croitoru)  Scr: 1.05 (11/01/22)  Age: 77 Weight: 82.1kg  Appropriate dose. Refill sent.

## 2023-04-18 NOTE — Progress Notes (Unsigned)
Cardiology Office Note:  .   Date:  04/21/2023  ID:  William Haw., DOB Apr 02, 1946, MRN [id number] PCP: Tresa Garter, MD  [facility] Providers Cardiologist:  Dr.Croitoru  }   History of Present Illness: .   William Lico. is a 77 y.o. male h/o PAF HTN, HL and non-obstructive CAD. He for annual follow up.  Of note her recent note from Dr. Royann Shivers  the patient had treatment with beta-blockers in the past but it led to symptomatic junctional escape rhythm and he is not on any rate control medications at this time with the exception of metoprolol to tartrate 25 mg as needed rapid heart rhythm or symptomatic irregular heart rhythm.  Since being seen last the patient has been doing well.  He is very physically active working on his property.  About 2 months ago the patient had attack while working in his yard with yellow jackets.  Significant amount of edema and injury from the stings.  For several weeks later the patient did have an elevated heart rate which has since normalized.  He did take the metoprolol during that time.  He denies any bleeding, melena, or epistaxis on the Eliquis.  ROS: As above otherwise negative.  Studies Reviewed: Marland Kitchen   EKG Interpretation Date/Time:  Thursday April 21 2023 09:34:02 EDT Ventricular Rate:  97 PR Interval:  110 QRS Duration:  112 QT Interval:  376 QTC Calculation: 477 R Axis:   -4  Text Interpretation: Atrial fib/Futter variable conduction Incomplete right bundle branch block ST & T wave abnormality, consider anterior ischemia No previous ECGs available Confirmed by Joni Reining [phone]) on 04/21/2023 11:18:01 AMCardiac CTA 10/24/2017 IMPRESSION: 1. Coronary calcium score of 262. This was 30 percentile for age and sex matched control.   2. Normal coronary origin with right dominance.   3. 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 Mid LCX artery has mixed plaque with stenosis  50-69%. Additional analysis with CT FFR will be submitted.   4. 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   EKG Interpretation Date/Time:  Thursday April 21 2023 09:34:02 EDT Ventricular Rate:  97 PR Interval:  110 QRS Duration:  112 QT Interval:  376 QTC Calculation: 477 R Axis:   -4  Text Interpretation: Atrial fib/Futter variable conduction Incomplete right bundle branch block ST & T wave abnormality, consider anterior ischemia No previous ECGs available Confirmed by Joni Reining [phone]) on 04/21/2023 11:18:01 AM   Physical Exam:   VS:  BP (!) 144/80 (BP Location: Left Arm, Patient Position: Sitting, Cuff Size: Normal)   Pulse 97   Ht 5\' 6"  (1.676 m)   Wt 185 lb 6.4 oz (84.1 kg)   SpO2 91%   BMI 29.92 kg/m    Wt Readings from Last 3 Encounters:  04/21/23 185 lb 6.4 oz (84.1 kg)  11/01/22 181 lb (82.1 kg)  05/11/22 186 lb (84.4 kg)    GEN: Well nourished, well developed in no acute distress NECK: No JVD; No carotid bruits CARDIAC: IRRR, no murmurs, rubs, gallops RESPIRATORY:  Clear to auscultation without rales, wheezing or rhonchi  ABDOMEN: Soft, non-tender, non-distended EXTREMITIES:  No edema; No deformity   ASSESSMENT AND PLAN: .    Paroxysmal atrial fibrillation: Currently in atrial fibs per EKG.  He is to continue as needed metoprolol and apixaban.  Patient is able to tell on occasion when he is in atrial fibrillation.  More  often than not however, he is unaware.  Today he is in atrial fibrillation and is completely asymptomatic and unaware of irregularity.   2.  Coronary artery disease: Nonobstructive per coronary CTA, February 2019.  Asymptomatic currently.  He remained very active and is not limited by any angina symptoms.  Continue secondary prevention with blood pressure control, statin therapy, purposeful exercise, and weight management.  3.  Hypercholesterolemia: Goal of LDL less than 70.  Most recent labs drawn in May 2024 total  cholesterol 126, LDL 52, HDL 55.  Continue statin therapy as directed.     Signed, Bettey Mare. Liborio Nixon, ANP, AACC

## 2023-04-21 ENCOUNTER — Ambulatory Visit: Payer: Medicare Other | Attending: Adult Health | Admitting: Adult Health

## 2023-04-21 ENCOUNTER — Encounter: Payer: Self-pay | Admitting: Adult Health

## 2023-04-21 VITALS — BP 144/80 | HR 97 | Ht 66.0 in | Wt 185.4 lb

## 2023-04-21 DIAGNOSIS — I480 Paroxysmal atrial fibrillation: Secondary | ICD-10-CM

## 2023-04-21 DIAGNOSIS — I10 Essential (primary) hypertension: Secondary | ICD-10-CM

## 2023-04-21 MED ORDER — METOPROLOL TARTRATE 25 MG PO TABS: 25.0000 mg | ORAL_TABLET | Freq: Three times a day (TID) | ORAL | 1 refills | Status: DC | PRN

## 2023-04-21 NOTE — Patient Instructions (Signed)
Medication Instructions:  No changes *If you need a refill on your cardiac medications before your next appointment, please call your pharmacy*   Lab Work: No labs If you have labs (blood work) drawn today and your tests are completely normal, you will receive your results only by: MyChart Message (if you have MyChart) OR A paper copy in the mail If you have any lab test that is abnormal or we need to change your treatment, we will call you to review the results.   Testing/Procedures: No Testing   Follow-Up: At [facility], you and your health needs are our priority.  As part of our continuing mission to provide you with exceptional heart care, we have created designated Provider Care Teams.  These Care Teams include your primary Cardiologist (physician) and Advanced Practice Providers (APPs -  Physician Assistants and Nurse Practitioners) who all work together to provide you with the care you need, when you need it.  We recommend signing up for the patient portal called "MyChart".  Sign up information is provided on this After Visit Summary.  MyChart is used to connect with patients for Virtual Visits (Telemedicine).  Patients are able to view lab/test results, encounter notes, upcoming appointments, etc.  Non-urgent messages can be sent to your provider as well.   To learn more about what you can do with MyChart, go to [email or web address].    Your next appointment:   1 year(s)  Provider:   Thurmon Fair, MD

## 2023-05-16 ENCOUNTER — Encounter: Payer: Self-pay | Admitting: Internal Medicine

## 2023-05-16 ENCOUNTER — Ambulatory Visit (INDEPENDENT_AMBULATORY_CARE_PROVIDER_SITE_OTHER): Payer: Medicare Other | Admitting: Internal Medicine

## 2023-05-16 ENCOUNTER — Encounter: Payer: Self-pay | Admitting: Gastroenterology

## 2023-05-16 VITALS — BP 118/68 | HR 100 | Temp 98.6°F | Ht 66.0 in | Wt 185.0 lb

## 2023-05-16 DIAGNOSIS — Z23 Encounter for immunization: Secondary | ICD-10-CM

## 2023-05-16 DIAGNOSIS — E119 Type 2 diabetes mellitus without complications: Secondary | ICD-10-CM | POA: Diagnosis not present

## 2023-05-16 DIAGNOSIS — E785 Hyperlipidemia, unspecified: Secondary | ICD-10-CM

## 2023-05-16 DIAGNOSIS — Z0000 Encounter for general adult medical examination without abnormal findings: Secondary | ICD-10-CM | POA: Diagnosis not present

## 2023-05-16 DIAGNOSIS — I10 Essential (primary) hypertension: Secondary | ICD-10-CM | POA: Diagnosis not present

## 2023-05-16 DIAGNOSIS — I480 Paroxysmal atrial fibrillation: Secondary | ICD-10-CM

## 2023-05-16 DIAGNOSIS — D751 Secondary polycythemia: Secondary | ICD-10-CM

## 2023-05-16 DIAGNOSIS — R972 Elevated prostate specific antigen [PSA]: Secondary | ICD-10-CM

## 2023-05-16 LAB — COMPREHENSIVE METABOLIC PANEL
ALT: 51 U/L (ref 0–53)
AST: 37 U/L (ref 0–37)
Albumin: 4.9 g/dL (ref 3.5–5.2)
Alkaline Phosphatase: 105 U/L (ref 39–117)
BUN: 16 mg/dL (ref 6–23)
CO2: 32 meq/L (ref 19–32)
Calcium: 9.7 mg/dL (ref 8.4–10.5)
Chloride: 99 meq/L (ref 96–112)
Creatinine, Ser: 1.02 mg/dL (ref 0.40–1.50)
GFR: 71.38 mL/min (ref >60.00)
Glucose, Bld: 118 mg/dL — ABNORMAL HIGH (ref 70–99)
Potassium: 3.9 meq/L (ref 3.5–5.1)
Sodium: 144 meq/L (ref 135–145)
Total Bilirubin: 1.5 mg/dL — ABNORMAL HIGH (ref 0.2–1.2)
Total Protein: 7.5 g/dL (ref 6.0–8.3)

## 2023-05-16 LAB — CBC WITH DIFFERENTIAL/PLATELET
Basophils Absolute: 0 10*3/uL (ref 0.0–0.1)
Basophils Relative: 0.7 % (ref 0.0–3.0)
Eosinophils Absolute: 0.2 10*3/uL (ref 0.0–0.7)
Eosinophils Relative: 3.6 % (ref 0.0–5.0)
HCT: 53.1 % — ABNORMAL HIGH (ref 39.0–52.0)
Hemoglobin: 17.7 g/dL — ABNORMAL HIGH (ref 13.0–17.0)
Lymphocytes Relative: 26.9 % (ref 12.0–46.0)
Lymphs Abs: 1.5 10*3/uL (ref 0.7–4.0)
MCHC: 33.3 g/dL (ref 30.0–36.0)
MCV: 90.9 fL (ref 78.0–100.0)
Monocytes Absolute: 0.3 10*3/uL (ref 0.1–1.0)
Monocytes Relative: 6.4 % (ref 3.0–12.0)
Neutro Abs: 3.4 10*3/uL (ref 1.4–7.7)
Neutrophils Relative %: 62.4 % (ref 43.0–77.0)
Platelets: 160 10*3/uL (ref 150.0–400.0)
RBC: 5.85 Mil/uL — ABNORMAL HIGH (ref 4.22–5.81)
RDW: 14.9 % (ref 11.5–15.5)
WBC: 5.4 10*3/uL (ref 4.0–10.5)

## 2023-05-16 LAB — URINALYSIS
Bilirubin Urine: NEGATIVE
Hgb urine dipstick: NEGATIVE
Ketones, ur: NEGATIVE
Leukocytes,Ua: NEGATIVE
Nitrite: NEGATIVE
Specific Gravity, Urine: 1.015 (ref 1.000–1.030)
Total Protein, Urine: NEGATIVE
Urine Glucose: >=1000 — AB
Urobilinogen, UA: 0.2 (ref 0.0–1.0)
pH: 6 (ref 5.0–8.0)

## 2023-05-16 LAB — LIPID PANEL
Cholesterol: 155 mg/dL (ref 0–200)
HDL: 56.7 mg/dL (ref >39.00)
LDL Cholesterol: 71 mg/dL (ref 0–99)
NonHDL: 98.7
Total CHOL/HDL Ratio: 3
Triglycerides: 138 mg/dL (ref 0.0–149.0)
VLDL: 27.6 mg/dL (ref 0.0–40.0)

## 2023-05-16 LAB — PSA: PSA: 3.2 ng/mL (ref 0.10–4.00)

## 2023-05-16 LAB — TSH: TSH: 2.24 u[IU]/mL (ref 0.35–5.50)

## 2023-05-16 MED ORDER — VALSARTAN-HYDROCHLOROTHIAZIDE 80-12.5 MG PO TABS: 1.0000 | ORAL_TABLET | Freq: Every day | ORAL | 3 refills | Status: DC

## 2023-05-16 NOTE — Assessment & Plan Note (Signed)
Cont on Diovan-HCT - lower dose

## 2023-05-16 NOTE — Assessment & Plan Note (Addendum)
  We discussed age appropriate health related issues, including available/recomended screening tests and vaccinations. Labs were ordered to be later reviewed . All questions were answered. We discussed one or more of the following - seat belt use, use of sunscreen/sun exposure exercise, fall risk reduction, second hand smoke exposure, firearm use and storage, seat belt use, a need for adhering to healthy diet and exercise. Labs were ordered.  All questions were answered. Last colon 2019 Dr Myrtie Neither, due in 2024 Get a tDAP Rectal per Urology and GI Pt declined CXR

## 2023-05-16 NOTE — Assessment & Plan Note (Signed)
On Eliquis

## 2023-05-16 NOTE — Assessment & Plan Note (Signed)
On  Farxiga

## 2023-05-16 NOTE — Progress Notes (Signed)
Subjective:  Patient ID: William Haw., male    DOB: 03-20-1946  Age: 77 y.o. MRN: [id number]  CC: Annual Exam   HPI William [name]. presents for a well exam BP is good on 1/2 Valsartan HCT  Outpatient Medications Prior to Visit  Medication Sig Dispense Refill   apixaban (ELIQUIS) 5 MG TABS tablet TAKE 1 TABLET BY MOUTH TWICE  DAILY 180 tablet 1   atorvastatin (LIPITOR) 80 MG tablet TAKE 1 TABLET BY MOUTH ONCE  DAILY 90 tablet 3   Cholecalciferol (VITAMIN D3) 50 MCG (2000 UT) capsule Take 1 capsule (2,000 Units total) by mouth daily. 100 capsule 3   ezetimibe (ZETIA) 10 MG tablet TAKE 1 TABLET BY MOUTH DAILY 90 tablet 3   FARXIGA 10 MG TABS tablet TAKE 1 TABLET BY MOUTH DAILY  BEFORE BREAKFAST 90 tablet 3   metoprolol tartrate (LOPRESSOR) 25 MG tablet Take 1 tablet (25 mg total) by mouth 3 (three) times daily as needed (rapid rates with afib). 90 tablet 1   potassium chloride SA (KLOR-CON M) 20 MEQ tablet Take 1 tablet (20 mEq total) by mouth daily. 90 tablet 3   tamsulosin (FLOMAX) 0.4 MG CAPS capsule Take 0.4 mg by mouth.     triamcinolone cream (KENALOG) 0.1 % APPLY TO AFFECTED AREA TWICE A DAY     valsartan-hydrochlorothiazide (DIOVAN-HCT) 160-25 MG tablet TAKE 1 TABLET BY MOUTH DAILY 90 tablet 3   clotrimazole-betamethasone (LOTRISONE) cream APPLY TO AFFECTED AREA EVERY DAY AS NEEDED (Patient not taking: Reported on 04/21/2023) 45 g 1   No facility-administered medications prior to visit.    ROS: Review of Systems  Constitutional:  Negative for appetite change, fatigue and unexpected weight change.  HENT:  Negative for congestion, nosebleeds, sneezing, sore throat and trouble swallowing.   Eyes:  Negative for itching and visual disturbance.  Respiratory:  Negative for cough.   Cardiovascular:  Negative for chest pain, palpitations and leg swelling.  Gastrointestinal:  Negative for abdominal distention, blood in stool, diarrhea and nausea.  Genitourinary:  Negative  for frequency and hematuria.  Musculoskeletal:  Negative for back pain, gait problem, joint swelling and neck pain.  Skin:  Negative for rash.  Neurological:  Negative for dizziness, tremors, speech difficulty and weakness.  Psychiatric/Behavioral:  Negative for agitation, dysphoric mood and sleep disturbance. The patient is not nervous/anxious.     Objective:  BP 118/68 (BP Location: Left Arm, Patient Position: Sitting, Cuff Size: Normal)   Pulse 100   Temp 98.6 F (37 C) (Oral)   Ht 5\' 6"  (1.676 m)   Wt 185 lb (83.9 kg)   SpO2 95%   BMI 29.86 kg/m   BP Readings from Last 3 Encounters:  05/16/23 118/68  04/21/23 (!) 144/80  11/01/22 110/68    Wt Readings from Last 3 Encounters:  05/16/23 185 lb (83.9 kg)  04/21/23 185 lb 6.4 oz (84.1 kg)  11/01/22 181 lb (82.1 kg)    Physical Exam Constitutional:      General: He is not in acute distress.    Appearance: He is well-developed.     Comments: NAD  Eyes:     Conjunctiva/sclera: Conjunctivae normal.     Pupils: Pupils are equal, round, and reactive to light.  Neck:     Thyroid: No thyromegaly.     Vascular: No JVD.  Cardiovascular:     Rate and Rhythm: Normal rate and regular rhythm.     Heart sounds: Normal heart sounds. No murmur  heard.    No friction rub. No gallop.  Pulmonary:     Effort: Pulmonary effort is normal. No respiratory distress.     Breath sounds: Normal breath sounds. No wheezing or rales.  Chest:     Chest wall: No tenderness.  Abdominal:     General: Bowel sounds are normal. There is no distension.     Palpations: Abdomen is soft. There is no mass.     Tenderness: There is no abdominal tenderness. There is no guarding or rebound.  Musculoskeletal:        General: No tenderness. Normal range of motion.     Cervical back: Normal range of motion.  Lymphadenopathy:     Cervical: No cervical adenopathy.  Skin:    General: Skin is warm and dry.     Findings: No rash.  Neurological:     Mental  Status: He is alert and oriented to person, place, and time.     Cranial Nerves: No cranial nerve deficit.     Motor: No abnormal muscle tone.     Coordination: Coordination normal.     Gait: Gait normal.     Deep Tendon Reflexes: Reflexes are normal and symmetric.  Psychiatric:        Behavior: Behavior normal.        Thought Content: Thought content normal.        Judgment: Judgment normal.   Rectal per Urology and GI  Lab Results  Component Value Date   WBC 5.7 05/11/2022   HGB 14.9 05/11/2022   HCT 44.0 05/11/2022   PLT 150.0 05/11/2022   GLUCOSE 116 (H) 11/01/2022   CHOL 126 11/01/2022   TRIG 94.0 11/01/2022   HDL 55.50 11/01/2022   LDLCALC 52 11/01/2022   ALT 41 11/01/2022   AST 33 11/01/2022   NA 140 11/01/2022   K 4.0 11/01/2022   CL 99 11/01/2022   CREATININE 1.05 11/01/2022   BUN 16 11/01/2022   CO2 33 (H) 11/01/2022   TSH 2.59 05/11/2022   PSA 2.80 05/11/2022   HGBA1C 6.7 (H) 11/01/2022   MICROALBUR 0.8 11/01/2022    CT CORONARY MORPH W/CTA COR W/SCORE W/CA W/CM &/OR WO/CM  Addendum Date: 10/24/2017   ADDENDUM REPORT: 10/24/2017 17:04 CLINICAL DATA:  82 -year-old male with PAF and abnormal exercise treadmill stress test. EXAM: Cardiac/Coronary  CT TECHNIQUE: The patient was scanned on a [employer]. FINDINGS: A 120 kV prospective scan was triggered in the descending thoracic aorta at 111 HU's. Axial non-contrast 3 mm slices were carried out through the heart. The data set was analyzed on a dedicated work station and scored using the Agatson method. Gantry rotation speed was 250 msecs and collimation was .6 mm. 5 mg of iv Metoprolol and 0.8 mg of sl NTG was given. The 3D data set was reconstructed in 5% intervals of the 67-82 % of the R-R cycle. Diastolic phases were analyzed on a dedicated work station using MPR, MIP and VRT modes. The patient received 80 cc of contrast. Aorta: Normal size. Mild calcifications in the descending aorta. No dissection.  Aortic Valve:  Trileaflet.  No calcifications. Coronary Arteries:  Normal coronary origin.  Right dominance. RCA is a large dominant artery that gives rise to PDA and PLVB. There is minimal calcified plaque with associated stenosis 0-25%. Left main is a large artery that gives rise to LAD and LCX arteries. Distal left main has a mild calcified plaque with stenosis 0-25%. LAD is a large vessel  that gives rise to two small diagonal arteries. There is mild mixed diffuse plaque with stenosis 0-25%. 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 LCX is a medium size non-dominant artery. Ostial LCX artery has mild calcified plaque with associated stenosis 25-50%. Mid LCX artery has mixed plaque with stenosis 50-69%. Other findings: Normal pulmonary vein drainage into the left atrium. Normal let atrial appendage without a thrombus. Normal size of the pulmonary artery. IMPRESSION: 1. Coronary calcium score of 262. This was 69 percentile for age and sex matched control. 2. Normal coronary origin with right dominance. 3. 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 Mid LCX artery has mixed plaque with stenosis 50-69%. Additional analysis with CT FFR will be submitted. 4. 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 Electronically Signed   By: Tobias Alexander   On: 10/24/2017 17:04   Result Date: 10/24/2017 EXAM: OVER-READ INTERPRETATION  CT CHEST The following report is an over-read performed by radiologist Dr. Charlett Nose of [facility] on 10/24/2017. This over-read does not include interpretation of cardiac or coronary anatomy or pathology. The coronary CTA interpretation by the cardiologist is attached. COMPARISON:  None. FINDINGS: Vascular: Heart is normal size.  Visualized aorta normal caliber. Mediastinum/Nodes: No adenopathy in the lower mediastinum or hila. Lungs/Pleura: Dependent atelectasis in the lower lobes. No effusions. Upper Abdomen: Imaging  into the upper abdomen shows no acute findings. Musculoskeletal: Chest wall soft tissues are unremarkable. No acute bony abnormality. IMPRESSION: No acute or significant extracardiac abnormality. Electronically Signed: By: Charlett Nose M.D. On: 10/24/2017 14:11    Assessment & Plan:   Problem List Items Addressed This Visit     Dyslipidemia    On Lipitor      Relevant Orders   TSH   Urinalysis   CBC with Differential/Platelet   Lipid panel   Comprehensive metabolic panel   Essential hypertension    Cont on Diovan-HCT - lower dose      Relevant Medications   valsartan-hydrochlorothiazide (DIOVAN-HCT) 80-12.5 MG tablet   PSA, INCREASED   Relevant Orders   PSA   Well adult exam - Primary     We discussed age appropriate health related issues, including available/recomended screening tests and vaccinations. Labs were ordered to be later reviewed . All questions were answered. We discussed one or more of the following - seat belt use, use of sunscreen/sun exposure exercise, fall risk reduction, second hand smoke exposure, firearm use and storage, seat belt use, a need for adhering to healthy diet and exercise. Labs were ordered.  All questions were answered. Last colon 2019 Dr Myrtie Neither, due in 2024 Get a tDAP Rectal per Urology and GI Pt declined CXR      Relevant Orders   TSH   Urinalysis   CBC with Differential/Platelet   Lipid panel   PSA   Comprehensive metabolic panel   Paroxysmal atrial fibrillation (HCC)    On Eliquis      Relevant Medications   valsartan-hydrochlorothiazide (DIOVAN-HCT) 80-12.5 MG tablet   DM (diabetes mellitus), type 2 (HCC)    On Farxiga      Relevant Medications   valsartan-hydrochlorothiazide (DIOVAN-HCT) 80-12.5 MG tablet      Meds ordered this encounter  Medications   valsartan-hydrochlorothiazide (DIOVAN-HCT) 80-12.5 MG tablet    Sig: Take 1 tablet by mouth daily.    Dispense:  90 tablet    Refill:  3      Follow-up:  Return in  about 6 months (around 11/13/2023).  Sonda Primes, MD

## 2023-05-16 NOTE — Assessment & Plan Note (Signed)
On Lipitor 

## 2023-05-16 NOTE — Addendum Note (Signed)
Addended by: Delsa Grana R on: 05/16/2023 10:57 AM   Modules accepted: Orders

## 2023-05-17 NOTE — Addendum Note (Signed)
Addended by: Tresa Garter on: 05/17/2023 07:33 AM   Modules accepted: Orders

## 2023-05-28 ENCOUNTER — Other Ambulatory Visit: Payer: Self-pay | Admitting: Internal Medicine

## 2023-06-04 ENCOUNTER — Other Ambulatory Visit: Payer: Self-pay | Admitting: Adult Health

## 2023-08-12 ENCOUNTER — Other Ambulatory Visit (INDEPENDENT_AMBULATORY_CARE_PROVIDER_SITE_OTHER): Payer: Medicare Other

## 2023-08-12 DIAGNOSIS — D751 Secondary polycythemia: Secondary | ICD-10-CM | POA: Diagnosis not present

## 2023-08-12 DIAGNOSIS — I10 Essential (primary) hypertension: Secondary | ICD-10-CM

## 2023-08-12 DIAGNOSIS — E119 Type 2 diabetes mellitus without complications: Secondary | ICD-10-CM

## 2023-08-12 LAB — CBC WITH DIFFERENTIAL/PLATELET
Basophils Absolute: 0 10*3/uL (ref 0.0–0.1)
Basophils Relative: 0.9 % (ref 0.0–3.0)
Eosinophils Absolute: 0.1 10*3/uL (ref 0.0–0.7)
Eosinophils Relative: 3.1 % (ref 0.0–5.0)
HCT: 52 % (ref 39.0–52.0)
Hemoglobin: 17.5 g/dL — ABNORMAL HIGH (ref 13.0–17.0)
Lymphocytes Relative: 32.7 % (ref 12.0–46.0)
Lymphs Abs: 1.6 10*3/uL (ref 0.7–4.0)
MCHC: 33.7 g/dL (ref 30.0–36.0)
MCV: 92.2 fL (ref 78.0–100.0)
Monocytes Absolute: 0.4 10*3/uL (ref 0.1–1.0)
Monocytes Relative: 7.9 % (ref 3.0–12.0)
Neutro Abs: 2.6 10*3/uL (ref 1.4–7.7)
Neutrophils Relative %: 55.4 % (ref 43.0–77.0)
Platelets: 140 10*3/uL — ABNORMAL LOW (ref 150.0–400.0)
RBC: 5.64 Mil/uL (ref 4.22–5.81)
RDW: 14.8 % (ref 11.5–15.5)
WBC: 4.8 10*3/uL (ref 4.0–10.5)

## 2023-08-12 LAB — COMPREHENSIVE METABOLIC PANEL
ALT: 49 U/L (ref 0–53)
AST: 35 U/L (ref 0–37)
Albumin: 4.5 g/dL (ref 3.5–5.2)
Alkaline Phosphatase: 96 U/L (ref 39–117)
BUN: 22 mg/dL (ref 6–23)
CO2: 29 meq/L (ref 19–32)
Calcium: 9.2 mg/dL (ref 8.4–10.5)
Chloride: 104 meq/L (ref 96–112)
Creatinine, Ser: 1 mg/dL (ref 0.40–1.50)
GFR: 72.98 mL/min (ref >60.00)
Glucose, Bld: 110 mg/dL — ABNORMAL HIGH (ref 70–99)
Potassium: 3.6 meq/L (ref 3.5–5.1)
Sodium: 141 meq/L (ref 135–145)
Total Bilirubin: 1.3 mg/dL — ABNORMAL HIGH (ref 0.2–1.2)
Total Protein: 7.5 g/dL (ref 6.0–8.3)

## 2023-08-12 LAB — HEMOGLOBIN A1C: Hgb A1c MFr Bld: 6.7 % — ABNORMAL HIGH (ref 4.6–6.5)

## 2023-08-13 LAB — IRON,TIBC AND FERRITIN PANEL
%SAT: 33 % (ref 20–48)
Ferritin: 278 ng/mL (ref 24–380)
Iron: 116 ug/dL (ref 50–180)
TIBC: 350 ug/dL (ref 250–425)

## 2023-08-17 ENCOUNTER — Telehealth (INDEPENDENT_AMBULATORY_CARE_PROVIDER_SITE_OTHER): Payer: Medicare Other | Admitting: Internal Medicine

## 2023-08-17 ENCOUNTER — Encounter: Payer: Self-pay | Admitting: Internal Medicine

## 2023-08-17 DIAGNOSIS — I10 Essential (primary) hypertension: Secondary | ICD-10-CM | POA: Diagnosis not present

## 2023-08-17 DIAGNOSIS — M2550 Pain in unspecified joint: Secondary | ICD-10-CM | POA: Diagnosis not present

## 2023-08-17 DIAGNOSIS — E785 Hyperlipidemia, unspecified: Secondary | ICD-10-CM

## 2023-08-17 DIAGNOSIS — D751 Secondary polycythemia: Secondary | ICD-10-CM | POA: Insufficient documentation

## 2023-08-17 DIAGNOSIS — G4733 Obstructive sleep apnea (adult) (pediatric): Secondary | ICD-10-CM

## 2023-08-17 DIAGNOSIS — M25551 Pain in right hip: Secondary | ICD-10-CM | POA: Diagnosis not present

## 2023-08-17 DIAGNOSIS — Z0000 Encounter for general adult medical examination without abnormal findings: Secondary | ICD-10-CM

## 2023-08-17 DIAGNOSIS — E119 Type 2 diabetes mellitus without complications: Secondary | ICD-10-CM

## 2023-08-17 DIAGNOSIS — E876 Hypokalemia: Secondary | ICD-10-CM

## 2023-08-17 NOTE — Assessment & Plan Note (Signed)
Mild. Barett can donate blood at [facility]. He is on the blood thinner. OSA on CPAP

## 2023-08-17 NOTE — Assessment & Plan Note (Addendum)
Continue on Lipitor  Lipitor side effects were discussed previously and probably not contributing much and he is arthralgias.

## 2023-08-17 NOTE — Assessment & Plan Note (Signed)
 Labs reviewed

## 2023-08-17 NOTE — Progress Notes (Signed)
Virtual Visit via Video Note  I connected with William Haw. on 08/17/23 at  8:50 AM EST by a video enabled telemedicine application and verified that I am speaking with the correct person using two identifiers.   I discussed the limitations of evaluation and management by telemedicine and the availability of in person appointments. The patient expressed understanding and agreed to proceed.  I was located at home due to inclement weather. The patient was at home. William [name] is present in the visit.  Chief Complaint  Patient presents with   Medical Management of Chronic Issues    3 mnth f/u     History of Present Illness:   Review of Systems  Constitutional:  Negative for malaise/fatigue and weight loss.  Musculoskeletal:  Positive for back pain and joint pain. Negative for myalgias.  Neurological:  Negative for sensory change and weakness.     Observations/Objective: The patient appears to be in no acute distress.  Looks well  Assessment and Plan:  Problem List Items Addressed This Visit     Dyslipidemia   Continue on Lipitor  Lipitor side effects were discussed previously and probably not contributing much and he is arthralgias.      Relevant Orders   TSH   Lipid panel   RESOLVED: HYPOKALEMIA   Labs reviewed      OSA on CPAP   Mild polycythemia. William [name] can donate blood at [facility]. He is on the blood thinner. On CPAP      Essential hypertension - Primary   Cont on Diovan-HCT at lower dose      Well adult exam   Relevant Orders   TSH   Urinalysis   CBC with Differential/Platelet   Lipid panel   PSA   Comprehensive metabolic panel   Right hip pain   Overall better Using Voltaren gel prn      DM (diabetes mellitus), type 2 (HCC)   Continue with [place].  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 Okay to use Voltaren gel or blue emu cream topically. Tylenol does not seem to help.  Then you can use ibuprofen 600 mg once a day with food once or twice a week (he is on the blood thinner). Mild opioid, ie tramadol use was discussed.  The patient is not interested. Lipitor side effects were discussed previously and probably not contributing much and he is arthralgias.       Polycythemia   Mild. William [name] can donate blood at [facility]. He is on the blood thinner. OSA on CPAP      Relevant Orders   CBC with Differential/Platelet     No orders of the defined types were placed in this encounter.    Follow Up Instructions:    I discussed the assessment and treatment plan with the patient. The patient was provided an opportunity to ask questions and all were answered. The patient agreed with the plan and demonstrated an understanding of the instructions.   The patient was advised to call back or seek an in-person evaluation if the symptoms worsen or if the condition fails to improve as anticipated.  I provided face-to-face time during this encounter. We were at different locations.   Sonda Primes, MD

## 2023-08-17 NOTE — Assessment & Plan Note (Signed)
Overall better Using Voltaren gel prn

## 2023-08-17 NOTE — Assessment & Plan Note (Signed)
Continue with [place].  Hemoglobin A1c is in the range

## 2023-08-17 NOTE — Assessment & Plan Note (Signed)
Cont on Diovan-HCT at lower dose

## 2023-08-17 NOTE — Assessment & Plan Note (Signed)
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 Okay to use Voltaren gel or blue emu cream topically. Tylenol does not seem to help.  Then you can use ibuprofen 600 mg once a day with food once or twice a week (he is on the blood thinner). Mild opioid, ie tramadol use was discussed.  The patient is not interested. Lipitor side effects were discussed previously and probably not contributing much and he is arthralgias.

## 2023-08-17 NOTE — Assessment & Plan Note (Signed)
Mild polycythemia. William [name] can donate blood at [facility]. He is on the blood thinner. On CPAP

## 2023-09-13 ENCOUNTER — Ambulatory Visit: Payer: Medicare Other | Admitting: Gastroenterology

## 2023-09-13 ENCOUNTER — Encounter: Payer: Self-pay | Admitting: Gastroenterology

## 2023-09-13 ENCOUNTER — Telehealth: Payer: Self-pay | Admitting: Gastroenterology

## 2023-09-13 ENCOUNTER — Telehealth: Payer: Self-pay

## 2023-09-13 VITALS — BP 130/80 | HR 106 | Ht 66.0 in | Wt 189.0 lb

## 2023-09-13 DIAGNOSIS — Z7902 Long term (current) use of antithrombotics/antiplatelets: Secondary | ICD-10-CM | POA: Diagnosis not present

## 2023-09-13 DIAGNOSIS — Z860100 Personal history of colon polyps, unspecified: Secondary | ICD-10-CM

## 2023-09-13 DIAGNOSIS — Z09 Encounter for follow-up examination after completed treatment for conditions other than malignant neoplasm: Secondary | ICD-10-CM | POA: Diagnosis not present

## 2023-09-13 MED ORDER — SUFLAVE 178.7 G PO SOLR: 1.0000 | Freq: Once | ORAL | 0 refills | Status: AC

## 2023-09-13 NOTE — Telephone Encounter (Signed)
 Patient had stated that he received a call from [place] but was not sure who was calling. Patient is requesting a call back. Please advise.

## 2023-09-13 NOTE — Telephone Encounter (Signed)
     Primary Cardiologist: Thurmon Fair, MD  Clinical pharmacist have reviewed chart  as part of pre-operative protocol coverage.  The following recommendations have been provided for , [facility] Jonan Seufert. :  CHA2DS2-VASc Score = 5   This indicates a 7.2% annual risk of stroke. The patient's score is based upon: CHF History: 0 HTN History: 1 Diabetes History: 1 Stroke History: 0 Vascular Disease History: 1 Age Score: 2 Gender Score: 0     CrCl 76 Platelet count 140   Per office protocol, patient can hold Eliquis for 2 days prior to procedure.   Patient will not need bridging with Lovenox (enoxaparin) around procedure.  I will route this recommendation to the requesting party via Epic fax function and remove from pre-op pool.  Please call with questions.  Thomasene Ripple. Teresha Hanks NP-C     09/13/2023, 3:02 PM [facility] [address] Office [phone] Fax [phone]

## 2023-09-13 NOTE — Patient Instructions (Signed)
  You have been scheduled for a colonoscopy. Please follow written instructions given to you at your visit today.   If you use inhalers (even only as needed), please bring them with you on the day of your procedure.  DO NOT TAKE 7 DAYS PRIOR TO TEST- Trulicity (dulaglutide) Ozempic, Wegovy (semaglutide) Mounjaro (tirzepatide) Bydureon Bcise (exanatide extended release)  DO NOT TAKE 1 DAY PRIOR TO YOUR TEST Rybelsus (semaglutide) Adlyxin (lixisenatide) Victoza (liraglutide) Byetta (exanatide) ___________________________________________________________________________  Bonita Quin will receive your bowel preparation through [facility], which ensures the lowest copay and home delivery, with outreach via text or call from an [phone] number. Please respond promptly to avoid rescheduling of your procedure. If you are interested in alternative options or have any questions regarding your prep, please contact them at [phone] ____________________________________________________________________________  Your Provider Has Sent Your Bowel Prep Regimen To [facility]   [facility] will contact you to verify your information and collect your copay, if applicable. Enjoy the comfort of your home while your prescription is mailed to you, FREE of any shipping charges.   [facility] accepts all major insurance benefits and applies discounts & coupons.  Have additional questions?   Chat: [email or web address] Call: [phone] Email: [email or web address] NCPDP: [id number]  How will [facility] contact you?  With a Welcome phone call,  a Welcome text and a checkout link in text form.  Texts you receive from [phone] Are NOT Spam.  *To set up delivery, you must complete the checkout process via link or speak to one of the patient care representatives. If [facility] is unable to reach you, your prescription may be delayed.  To avoid long hold times on the phone, you may also utilize the secure  chat feature on the [facility] website to request that they call you back for transaction completion or to expedite your concerns.  _______________________________________________________  If your blood pressure at your visit was 140/90 or greater, please contact your primary care physician to follow up on this.  _______________________________________________________  If you are age 34 or older, your body mass index should be between 23-30. Your Body mass index is 30.51 kg/m. If this is out of the aforementioned range listed, please consider follow up with your Primary Care Provider.  If you are age 46 or younger, your body mass index should be between 19-25. Your Body mass index is 30.51 kg/m. If this is out of the aformentioned range listed, please consider follow up with your Primary Care Provider.   ________________________________________________________  The [place] [facility] providers would like to encourage you to use [facility] to communicate with providers for non-urgent requests or questions.  Due to long hold times on the telephone, sending your provider a message by [facility] may be a faster and more efficient way to get a response.  Please allow 48 business hours for a response.  Please remember that this is for non-urgent requests.  _______________________________________________________ It was a pleasure to see you today!  Thank you for trusting me with your gastrointestinal care!

## 2023-09-13 NOTE — Telephone Encounter (Signed)
 [facility] Pre-operative Risk Assessment     Request for surgical clearance:     Endoscopy Procedure  What type of surgery is being performed?     Colonoscopy   When is this surgery scheduled?     10/28/2023  What type of clearance is required ?   Pharmacy  Are there any medications that need to be held prior to surgery and how long? Eliquis- 2 days prior   Practice name and name of physician performing surgery?      [facility]  What is your office phone and fax number?      Phone- [phone]  Fax- [phone]  Anesthesia type (None, local, MAC, general) ?       MAC   Please route your response to Dezaria Methot

## 2023-09-13 NOTE — Progress Notes (Signed)
 [facility] Consult Note:  History: William [name]. 09/13/2023  Referring provider: Tresa Garter, MD  Reason for consult/chief complaint: Colonoscopy (Pt states he would like to discuss a colon.)   Subjective  Prior history:  Subcentimeter tubular adenoma April 2014  2 subcentimeter tubular adenomas and 3 subcentimeter hyperplastic polyps October 2019 (Dr. Myrtie Neither) small internal hemorrhoids also noted  Office visit July 2020 for acute perianal swelling and rectal bleeding-seen in office by Dr. Lavon Paganini, bleeding from external hemorrhoids and treated with topical therapies.  Paroxysmal atrial fibrillation on Eliquis.  Nonobstructive coronary disease on CTA 2019. no angina reported at cardiology office note October 2024.  (Dr. Royann Shivers is his primary cardiologist) Normal LVEF and no significant valvular abnormalities March 2016 2D echocardiogram  _____________   William [name] is a 78 year old man known to me from prior colonoscopy noted above. He had an episode of hemorrhoidal swelling and bleeding in 2020 as noted above, and has had no recurrences since then. Bowel habits regular without rectal bleeding.  Denies chronic upper digestive symptoms such as frequent heartburn, dysphagia, nausea, vomiting or loss of appetite.  He believes his weight is more or less stable.    ROS:  Review of Systems  Constitutional:  Negative for appetite change and unexpected weight change.  HENT:  Negative for mouth sores and voice change.   Eyes:  Negative for pain and redness.  Respiratory:  Negative for cough and shortness of breath.   Cardiovascular:  Negative for chest pain and palpitations.  Genitourinary:  Negative for dysuria and hematuria.  Musculoskeletal:  Negative for arthralgias and myalgias.  Skin:  Negative for pallor and rash.  Neurological:  Negative for weakness and headaches.  Hematological:  Negative for adenopathy.     Past Medical History: Past  Medical History:  Diagnosis Date   Atrial fibrillation (HCC)    Colon polyps    Dr Jarold Motto   Diabetes [facility])    GERD (gastroesophageal reflux disease)    HTN (hypertension)    Hyperlipidemia    OSA on CPAP    Prostatitis      Past Surgical History: Past Surgical History:  Procedure Laterality Date   APPENDECTOMY  2012   09/2010 in [place]   HERNIA REPAIR  1975   ROTATOR CUFF REPAIR  2008   left; Dr Wyline Mood     Family History: Family History  Problem Relation Age of Onset   Coronary artery disease Mother    Heart disease Mother 42       MI   Heart disease Father 22       MI   Stroke Sister        Cranial anuerysm   Asthma Son    Hypertension Other    Colon cancer Neg Hx    Liver cancer Neg Hx    Esophageal cancer Neg Hx     Social History: Social History   Socioeconomic History   Marital status: Married    Spouse name: Britta Mccreedy   Number of children: 3   Years of education: college   Highest education level: Bachelor's degree (e.g., BA, AB, BS)  Occupational History   Occupation: Accountant/Retired  Tobacco Use   Smoking status: Never   Smokeless tobacco: Never  Vaping Use   Vaping status: Never Used  Substance and Sexual Activity   Alcohol use: No   Drug use: No   Sexual activity: Yes  Other Topics Concern   Not on file  Social History Narrative  Regular Exercise - Yes, gym            Social Drivers of Health   Financial Resource Strain: Low Risk  (08/16/2023)   Overall Financial Resource Strain (CARDIA)    Difficulty of Paying Living Expenses: Not hard at all  Food Insecurity: No Food Insecurity (08/16/2023)   Hunger Vital Sign    Worried About Running Out of Food in the Last Year: Never true    Ran Out of Food in the Last Year: Never true  Transportation Needs: No Transportation Needs (08/16/2023)   PRAPARE - Administrator, Civil Service (Medical): No    Lack of Transportation (Non-Medical): No  Physical Activity: Sufficiently  Active (08/16/2023)   Exercise Vital Sign    Days of Exercise per Week: 6 days    Minutes of Exercise per Session: 30 min  Stress: No Stress Concern Present (08/16/2023)   [employer] of Occupational Health - Occupational Stress Questionnaire    Feeling of Stress : Not at all  Social Connections: Socially Integrated (08/16/2023)   Social Connection and Isolation Panel [NHANES]    Frequency of Communication with Friends and Family: More than three times a week    Frequency of Social Gatherings with Friends and Family: Once a week    Attends Religious Services: More than 4 times per year    Active Member of [employer] or Organizations: Yes    Attends Engineer, structural: More than 4 times per year    Marital Status: Married    Allergies: Allergies  Allergen Reactions   Benazepril Hcl Other (See Comments)    REACTION: cough    Outpatient Meds: Current Outpatient Medications  Medication Sig Dispense Refill   apixaban (ELIQUIS) 5 MG TABS tablet TAKE 1 TABLET BY MOUTH TWICE  DAILY 180 tablet 1   atorvastatin (LIPITOR) 80 MG tablet TAKE 1 TABLET BY MOUTH ONCE  DAILY 90 tablet 3   Cholecalciferol (VITAMIN D3) 50 MCG (2000 UT) capsule Take 1 capsule (2,000 Units total) by mouth daily. 100 capsule 3   ezetimibe (ZETIA) 10 MG tablet TAKE 1 TABLET BY MOUTH DAILY 90 tablet 3   FARXIGA 10 MG TABS tablet TAKE 1 TABLET BY MOUTH DAILY  BEFORE BREAKFAST 90 tablet 3   metoprolol tartrate (LOPRESSOR) 25 MG tablet TAKE 1 TABLET BY MOUTH 3 TIMES  DAILY AS NEEDED FOR RAPID RATES  WITH AFIB 180 tablet 5   potassium chloride SA (KLOR-CON M) 20 MEQ tablet TAKE 1 TABLET BY MOUTH DAILY 90 tablet 3   tamsulosin (FLOMAX) 0.4 MG CAPS capsule Take 0.4 mg by mouth.     triamcinolone cream (KENALOG) 0.1 % APPLY TO AFFECTED AREA TWICE A DAY     valsartan-hydrochlorothiazide (DIOVAN-HCT) 80-12.5 MG tablet Take 1 tablet by mouth daily. 90 tablet 3   clotrimazole-betamethasone (LOTRISONE) cream APPLY TO  AFFECTED AREA EVERY DAY AS NEEDED (Patient not taking: Reported on 09/13/2023) 45 g 1   No current facility-administered medications for this visit.      ___________________________________________________________________ Objective   Exam:  BP 130/80   Pulse (!) 106   Ht 5\' 6"  (1.676 m)   Wt 189 lb (85.7 kg)   BMI 30.51 kg/m  Wt Readings from Last 3 Encounters:  09/13/23 189 lb (85.7 kg)  05/16/23 185 lb (83.9 kg)  04/21/23 185 lb 6.4 oz (84.1 kg)    General: Well-appearing Eyes: sclera anicteric, no redness ENT: oral mucosa moist without lesions, no cervical or supraclavicular lymphadenopathy  CV: Regular without appreciable murmur, no JVD, trace bilateral pretibial edema Resp: clear to auscultation bilaterally, normal RR and effort noted GI: soft, no tenderness, with active bowel sounds. No guarding or palpable organomegaly noted. Skin; warm and dry, no rash or jaundice noted Neuro: awake, alert and oriented x 3. Normal gross motor function and fluent speech   Labs:     Latest Ref Rng & Units 08/12/2023    9:25 AM 05/16/2023    9:44 AM 05/11/2022    8:51 AM  CBC  WBC 4.0 - 10.5 K/uL 4.8  5.4  5.7   Hemoglobin 13.0 - 17.0 g/dL 40.9  81.1  91.4   Hematocrit 39.0 - 52.0 % 52.0  53.1  44.0   Platelets 150.0 - 400.0 K/uL 140.0  160.0  150.0       Latest Ref Rng & Units 08/12/2023    9:25 AM 05/16/2023    9:44 AM 11/01/2022   10:50 AM  CMP  Glucose 70 - 99 mg/dL 782  956  213   BUN 6 - 23 mg/dL 22  16  16    Creatinine 0.40 - 1.50 mg/dL 0.86  5.78  4.69   Sodium 135 - 145 mEq/L 141  144  140   Potassium 3.5 - 5.1 mEq/L 3.6  3.9  4.0   Chloride 96 - 112 mEq/L 104  99  99   CO2 19 - 32 mEq/L 29  32  33   Calcium 8.4 - 10.5 mg/dL 9.2  9.7  9.4   Total Protein 6.0 - 8.3 g/dL 7.5  7.5  7.5   Total Bilirubin 0.2 - 1.2 mg/dL 1.3  1.5  1.5   Alkaline Phos 39 - 117 U/L 96  105  101   AST 0 - 37 U/L 35  37  33   ALT 0 - 53 U/L 49  51  41    Normal iron levels  08/12/2023   Encounter Diagnoses  Name Primary?   Hx of colonic polyps Yes   Long term (current) use of antithrombotics/antiplatelets     History of colon polyps-due for surveillance colonoscopy. Stable A-fib on oral anticoagulation, no angina or dyspnea.  No history of heart failure.  Prior episode of hemorrhoidal bleeding, none since.  No chronic upper digestive symptoms.  He is appropriate for surveillance colonoscopy in our outpatient endoscopy lab, he was agreeable after discussion of procedure and risks.  The benefits and risks of the planned procedure(s) were described in detail with the patient or (when appropriate) their health care proxy.  Risks were outlined as including, but not limited to, bleeding, infection, perforation, adverse medication reaction leading to cardiac or pulmonary decompensation, pancreatitis (if ERCP).  The limitation of incomplete mucosal visualization was also discussed.  No guarantees or warranties were given.  Small risk of stroke during a brief hold oral anticoagulation.  Last dose 36 hours before procedure, will most likely resume at later the day of procedure by following day depending on results and any polypectomy. We will contact his cardiologist to be sure they do not have objection to the brief Eliquis hold. Depending on results, we will also determine if he needs any future surveillance colonoscopies based on current guidelines.  We had discussion about that, and he asked some good questions that were addressed.   Thank you for the courtesy of this consult.  Please call me with any questions or concerns.  Charlie Pitter III  CC: Referring provider noted above

## 2023-09-13 NOTE — Telephone Encounter (Signed)
 Patient with diagnosis of atrial fibrillation on Eliquis for anticoagulation.    What type of surgery is being performed?     Colonoscopy   When is this surgery scheduled?     10/28/2023    CHA2DS2-VASc Score = 5   This indicates a 7.2% annual risk of stroke. The patient's score is based upon: CHF History: 0 HTN History: 1 Diabetes History: 1 Stroke History: 0 Vascular Disease History: 1 Age Score: 2 Gender Score: 0    CrCl 76 Platelet count 140  Per office protocol, patient can hold Eliquis for 2 days prior to procedure.   Patient will not need bridging with Lovenox (enoxaparin) around procedure.  **This guidance is not considered finalized until pre-operative APP has relayed final recommendations.**

## 2023-10-21 ENCOUNTER — Other Ambulatory Visit: Payer: Self-pay | Admitting: Cardiovascular Disease

## 2023-10-21 DIAGNOSIS — I480 Paroxysmal atrial fibrillation: Secondary | ICD-10-CM

## 2023-10-24 NOTE — Telephone Encounter (Signed)
 Prescription refill request for Eliquis  received. Indication: PAF Last office visit: 04/21/23  Francis Irons NP Scr: 1.00 on 08/12/23  Epic Age: 78 Weight: 84.1kg  Based on above findings Eliquis  5mg  twice daily is the appropriate dose.  Refill approved.

## 2023-10-28 ENCOUNTER — Encounter: Admitting: Gastroenterology

## 2023-11-14 ENCOUNTER — Ambulatory Visit: Payer: Medicare Other | Admitting: Internal Medicine

## 2023-12-13 ENCOUNTER — Ambulatory Visit: Admitting: Gastroenterology

## 2023-12-13 ENCOUNTER — Encounter: Payer: Self-pay | Admitting: Gastroenterology

## 2023-12-13 VITALS — BP 100/78 | HR 101 | Temp 98.0°F | Resp 16 | Ht 66.0 in | Wt 179.2 lb

## 2023-12-13 DIAGNOSIS — D124 Benign neoplasm of descending colon: Secondary | ICD-10-CM

## 2023-12-13 DIAGNOSIS — D122 Benign neoplasm of ascending colon: Secondary | ICD-10-CM

## 2023-12-13 DIAGNOSIS — K6389 Other specified diseases of intestine: Secondary | ICD-10-CM | POA: Diagnosis not present

## 2023-12-13 DIAGNOSIS — N400 Enlarged prostate without lower urinary tract symptoms: Secondary | ICD-10-CM

## 2023-12-13 DIAGNOSIS — K648 Other hemorrhoids: Secondary | ICD-10-CM

## 2023-12-13 DIAGNOSIS — Z860100 Personal history of colon polyps, unspecified: Secondary | ICD-10-CM

## 2023-12-13 DIAGNOSIS — K5140 Inflammatory polyps of colon without complications: Secondary | ICD-10-CM

## 2023-12-13 DIAGNOSIS — Z1211 Encounter for screening for malignant neoplasm of colon: Secondary | ICD-10-CM

## 2023-12-13 MED ORDER — SODIUM CHLORIDE 0.9 % IV SOLN: 500.0000 mL | Freq: Once | INTRAVENOUS | Status: DC

## 2023-12-13 NOTE — Op Note (Signed)
 [facility] Patient Name: William [name] Procedure Date: 12/13/2023 10:14 AM MRN: [id number] Endoscopist: Ace Abu L. Dominic Friendly , MD, [id number] Age: 78 Referring MD:  Date of Birth: 09-18-1945 Gender: Male Account #: [id number] Procedure:                Colonoscopy Indications:              Personal history of colonic polyps                           SubCm TA in 2014                           two SubCM TAs Oct 2019 Medicines:                Monitored Anesthesia Care Procedure:                Pre-Anesthesia Assessment:                           - Prior to the procedure, a History and Physical                            was performed, and patient medications and                            allergies were reviewed. The patient's tolerance of                            previous anesthesia was also reviewed. The risks                            and benefits of the procedure and the sedation                            options and risks were discussed with the patient.                            All questions were answered, and informed consent                            was obtained. Prior Anticoagulants: The patient has                            taken Eliquis  (apixaban ), last dose was 1 day prior                            to procedure. ASA Grade Assessment: III - A patient                            with severe systemic disease. After reviewing the                            risks and benefits, the patient was deemed in  satisfactory condition to undergo the procedure.                           After obtaining informed consent, the colonoscope                            was passed under direct vision. Throughout the                            procedure, the patient's blood pressure, pulse, and                            oxygen saturations were monitored continuously. The                            Olympus Scope [id number] was introduced through the                             anus and advanced to the the cecum, identified by                            appendiceal orifice and ileocecal valve. The                            colonoscopy was performed without difficulty. The                            patient tolerated the procedure well. The quality                            of the bowel preparation was good. The ileocecal                            valve, appendiceal orifice, and rectum were                            photographed. Scope In: 10:21:09 AM Scope Out: 11:00:02 AM Scope Withdrawal Time: 0 hours 31 minutes 57 seconds  Total Procedure Duration: 0 hours 38 minutes 53 seconds  Findings:                 The digital rectal exam findings include enlarged                            and soft prostate.                           Repeat examination of right colon under NBI                            performed.                           Three sessile polyps were found in the ascending  colon. The polyps were 2 to 6 mm in size. These                            polyps were removed with a cold snare. Resection                            and retrieval were complete.                           A 20 mm polyp was found in the proximal descending                            colon.(20mm long, 8mm wide) The polyp was                            pedunculated. Area was successfully injected with                            2.5 mL of a 0.01 mg/mL solution of epinephrine for                            hemostasis before polypectomy. The polyp was                            removed en bloc with a hot snare. Resection and                            retrieval were complete. To prevent bleeding                            post-intervention, one hemostatic clip was                            successfully placed (MR conditional). Clip                            manufacturer: [employer].                           Internal hemorrhoids were found.                            The exam was otherwise without abnormality on                            direct and retroflexion views. Complications:            No immediate complications. Estimated Blood Loss:     Estimated blood loss was minimal. Impression:               - Enlarged prostate found on digital rectal exam.                           - Three 2 to 6 mm polyps in the ascending colon,  removed with a cold snare. Resected and retrieved.                           - One 20 mm x 8mm polyp in the proximal descending                            colon, removed with a hot snare. Resected and                            retrieved. Injected. Clip (MR conditional) was                            placed. Clip manufacturer: [employer].                           - Internal hemorrhoids.                           - The examination was otherwise normal on direct                            and retroflexion views. Recommendation:           - Patient has a contact number available for                            emergencies. The signs and symptoms of potential                            delayed complications were discussed with the                            patient. Return to normal activities tomorrow.                            Written discharge instructions were provided to the                            patient.                           - Resume previous diet.                           - Continue present medications.                           - Resume Eliquis  (apixaban ) at prior dose tomorrow.                           - Await pathology results.                           - Repeat colonoscopy is recommended for  surveillance. The colonoscopy date will be                            determined after pathology results from today's                            exam become available for review. Saraih Lorton L. Dominic Friendly, MD 12/13/2023 11:09:52 AM This report has been  signed electronically.

## 2023-12-13 NOTE — Progress Notes (Signed)
 To pacu, VSS. Report to [email or web address].tb

## 2023-12-13 NOTE — Progress Notes (Signed)
 History and Physical:  This patient presents for endoscopic testing for: Encounter Diagnoses  Name Primary?   Hx of colonic polyps Yes   Long term (current) use of antithrombotics/antiplatelets    Surveillance colonoscopy today  Subcentimeter tubular adenoma April 2014   2 subcentimeter tubular adenomas and 3 subcentimeter hyperplastic polyps October 2019 (Dr. Dominic Friendly) small internal hemorrhoids also noted   Office visit July 2020 for acute perianal swelling and rectal bleeding-seen in office by Dr. Leonia Raman, bleeding from external hemorrhoids and treated with topical therapies.   Paroxysmal atrial fibrillation on Eliquis .  Nonobstructive coronary disease on CTA 2019. no angina reported at cardiology office note October 2024.  (Dr. Alvis Ba is his primary cardiologist) Normal LVEF and no significant valvular abnormalities March 2016 2D echocardiogram   Patient is otherwise without complaints or active issues today.   Past Medical History: Past Medical History:  Diagnosis Date   Atrial fibrillation [facility])    Colon polyps    Dr Adan Holms   Diabetes [facility])    GERD (gastroesophageal reflux disease)    HTN (hypertension)    Hyperlipidemia    OSA on CPAP    Prostatitis      Past Surgical History: Past Surgical History:  Procedure Laterality Date   APPENDECTOMY  2012   09/2010 in [place]   HERNIA REPAIR  1975   ROTATOR CUFF REPAIR  2008   left; Dr Watson Hacking    Allergies: Allergies  Allergen Reactions   Benazepril Hcl Other (See Comments)    REACTION: cough    Outpatient Meds: Current Outpatient Medications  Medication Sig Dispense Refill   apixaban  (ELIQUIS ) 5 MG TABS tablet TAKE 1 TABLET BY MOUTH TWICE  DAILY 180 tablet 1   atorvastatin  (LIPITOR) 80 MG tablet TAKE 1 TABLET BY MOUTH ONCE  DAILY 90 tablet 3   Cholecalciferol (VITAMIN D3) 50 MCG (2000 UT) capsule Take 1 capsule (2,000 Units total) by mouth daily. 100 capsule 3   ezetimibe  (ZETIA ) 10 MG tablet TAKE 1 TABLET  BY MOUTH DAILY 90 tablet 3   FARXIGA  10 MG TABS tablet TAKE 1 TABLET BY MOUTH DAILY  BEFORE BREAKFAST 90 tablet 3   metoprolol  tartrate (LOPRESSOR ) 25 MG tablet TAKE 1 TABLET BY MOUTH 3 TIMES  DAILY AS NEEDED FOR RAPID RATES  WITH AFIB 180 tablet 5   potassium chloride  SA (KLOR-CON  M) 20 MEQ tablet TAKE 1 TABLET BY MOUTH DAILY 90 tablet 3   tamsulosin (FLOMAX) 0.4 MG CAPS capsule Take 0.4 mg by mouth.     valsartan -hydrochlorothiazide  (DIOVAN -HCT) 80-12.5 MG tablet Take 1 tablet by mouth daily. 90 tablet 3   clotrimazole -betamethasone  (LOTRISONE ) cream APPLY TO AFFECTED AREA EVERY DAY AS NEEDED (Patient not taking: Reported on 09/13/2023) 45 g 1   triamcinolone cream (KENALOG) 0.1 % APPLY TO AFFECTED AREA TWICE A DAY     Current Facility-Administered Medications  Medication Dose Route Frequency Provider Last Rate Last Admin   0.9 %  sodium chloride  infusion  500 mL Intravenous Once Danis, Nathaneal Sommers L III, MD          ___________________________________________________________________ Objective   Exam:  BP 129/84   Pulse (!) 121   Temp 98 F (36.7 C) (Temporal)   Ht 5' 6 (1.676 m)   Wt 179 lb 3.2 oz (81.3 kg)   SpO2 96%   BMI 28.92 kg/m   CV: regular , S1/S2 Resp: clear to auscultation bilaterally, normal RR and effort noted GI: soft, no tenderness, with active bowel sounds.   Assessment:  Encounter Diagnoses  Name Primary?   Hx of colonic polyps Yes   Long term (current) use of antithrombotics/antiplatelets      Plan: Colonoscopy   The benefits and risks of the planned procedure(s) were described in detail with the patient or (when appropriate) their health care proxy.  Risks were outlined as including, but not limited to, bleeding, infection, perforation, adverse medication reaction leading to cardiac or pulmonary decompensation, pancreatitis (if ERCP).  The limitation of incomplete mucosal visualization was also discussed.  No guarantees or warranties were given.  The  patient is appropriate for an endoscopic procedure in the ambulatory setting.   - Lorella Roles, MD

## 2023-12-13 NOTE — Progress Notes (Signed)
 Called to room to assist during endoscopic procedure.  Patient ID and intended procedure confirmed with present staff. Received instructions for my participation in the procedure from the performing physician.

## 2023-12-13 NOTE — Patient Instructions (Signed)
 A card (indicating a a clip in place at polyp removal site descending colon) given to you to carry in your wallet in case a Radiology or medical procedure needed  RESUME ELIQUIS  AT PRIOR DOSE TOMORROW (12/14/23)  Handouts on polyps & hemorrhoids given to you today.   Await pathology results on polyps removed   Handout on hemorrhoidal banding given to you today  Continue previous diet & medications    YOU HAD AN ENDOSCOPIC PROCEDURE TODAY AT THE [facility]:   Refer to the procedure report that was given to you for any specific questions about what was found during the examination.  If the procedure report does not answer your questions, please call your gastroenterologist to clarify.  If you requested that your care partner not be given the details of your procedure findings, then the procedure report has been included in a sealed envelope for you to review at your convenience later.  YOU SHOULD EXPECT: Some feelings of bloating in the abdomen. Passage of more gas than usual.  Walking can help get rid of the air that was put into your GI tract during the procedure and reduce the bloating. If you had a lower endoscopy (such as a colonoscopy or flexible sigmoidoscopy) you may notice spotting of blood in your stool or on the toilet paper. If you underwent a bowel prep for your procedure, you may not have a normal bowel movement for a few days.  Please Note:  You might notice some irritation and congestion in your nose or some drainage.  This is from the oxygen used during your procedure.  There is no need for concern and it should clear up in a day or so.  SYMPTOMS TO REPORT IMMEDIATELY:  Following lower endoscopy (colonoscopy or flexible sigmoidoscopy):  Excessive amounts of blood in the stool  Significant tenderness or worsening of abdominal pains  Swelling of the abdomen that is new, acute  Fever of 100F or higher    For urgent or emergent issues, a gastroenterologist  can be reached at any hour by calling [phone]. Do not use MyChart messaging for urgent concerns.    DIET:  We do recommend a small meal at first, but then you may proceed to your regular diet.  Drink plenty of fluids but you should avoid alcoholic beverages for 24 hours.  ACTIVITY:  You should plan to take it easy for the rest of today and you should NOT DRIVE or use heavy machinery until tomorrow (because of the sedation medicines used during the test).    FOLLOW UP: Our staff will call the number listed on your records the next business day following your procedure.  We will call around 7:15- 8:00 am to check on you and address any questions or concerns that you may have regarding the information given to you following your procedure. If we do not reach you, we will leave a message.     If any biopsies were taken you will be contacted by phone or by letter within the next 1-3 weeks.  Please call us  at [phone] if you have not heard about the biopsies in 3 weeks.    SIGNATURES/CONFIDENTIALITY: You and/or your care partner have signed paperwork which will be entered into your electronic medical record.  These signatures attest to the fact that that the information above on your After Visit Summary has been reviewed and is understood.  Full responsibility of the confidentiality of this discharge information lies with you  and/or your care-partner.

## 2023-12-14 ENCOUNTER — Telehealth: Payer: Self-pay | Admitting: *Deleted

## 2023-12-14 NOTE — Telephone Encounter (Signed)
  Follow up Call-     12/13/2023    8:01 AM  Call back number  Post procedure Call Back phone  # [phone]  Permission to leave phone message Yes     Patient questions:  Do you have a fever, pain , or abdominal swelling? No. Pain Score  0 *  Have you tolerated food without any problems? Yes.    Have you been able to return to your normal activities? Yes.    Do you have any questions about your discharge instructions: Diet   No. Medications  No. Follow up visit  No.  Do you have questions or concerns about your Care? No.  Actions: * If pain score is 4 or above: No action needed, pain <4.

## 2023-12-16 ENCOUNTER — Ambulatory Visit: Payer: Self-pay | Admitting: Gastroenterology

## 2023-12-16 LAB — SURGICAL PATHOLOGY

## 2024-03-08 ENCOUNTER — Ambulatory Visit: Admitting: Internal Medicine

## 2024-03-08 VITALS — BP 122/84 | HR 92 | Temp 97.7°F | Ht 66.0 in | Wt 182.2 lb

## 2024-03-08 DIAGNOSIS — G4733 Obstructive sleep apnea (adult) (pediatric): Secondary | ICD-10-CM | POA: Diagnosis not present

## 2024-03-08 DIAGNOSIS — D751 Secondary polycythemia: Secondary | ICD-10-CM | POA: Diagnosis not present

## 2024-03-08 DIAGNOSIS — E119 Type 2 diabetes mellitus without complications: Secondary | ICD-10-CM | POA: Diagnosis not present

## 2024-03-08 DIAGNOSIS — E785 Hyperlipidemia, unspecified: Secondary | ICD-10-CM

## 2024-03-08 DIAGNOSIS — Z0000 Encounter for general adult medical examination without abnormal findings: Secondary | ICD-10-CM

## 2024-03-08 DIAGNOSIS — I10 Essential (primary) hypertension: Secondary | ICD-10-CM | POA: Diagnosis not present

## 2024-03-08 DIAGNOSIS — R739 Hyperglycemia, unspecified: Secondary | ICD-10-CM

## 2024-03-08 LAB — MICROALBUMIN / CREATININE URINE RATIO
Creatinine,U: 110.9 mg/dL
Microalb Creat Ratio: 24.2 mg/g (ref 0.0–30.0)
Microalb, Ur: 2.7 mg/dL — ABNORMAL HIGH (ref 0.0–1.9)

## 2024-03-08 LAB — CBC WITH DIFFERENTIAL/PLATELET
Basophils Absolute: 0 K/uL (ref 0.0–0.1)
Basophils Relative: 0.6 % (ref 0.0–3.0)
Eosinophils Absolute: 0.2 K/uL (ref 0.0–0.7)
Eosinophils Relative: 4.5 % (ref 0.0–5.0)
HCT: 53.2 % — ABNORMAL HIGH (ref 39.0–52.0)
Hemoglobin: 17.7 g/dL — ABNORMAL HIGH (ref 13.0–17.0)
Lymphocytes Relative: 28.4 % (ref 12.0–46.0)
Lymphs Abs: 1.4 K/uL (ref 0.7–4.0)
MCHC: 33.3 g/dL (ref 30.0–36.0)
MCV: 90.7 fl (ref 78.0–100.0)
Monocytes Absolute: 0.4 K/uL (ref 0.1–1.0)
Monocytes Relative: 8.4 % (ref 3.0–12.0)
Neutro Abs: 2.8 K/uL (ref 1.4–7.7)
Neutrophils Relative %: 58.1 % (ref 43.0–77.0)
Platelets: 152 K/uL (ref 150.0–400.0)
RBC: 5.87 Mil/uL — ABNORMAL HIGH (ref 4.22–5.81)
RDW: 14.6 % (ref 11.5–15.5)
WBC: 4.8 K/uL (ref 4.0–10.5)

## 2024-03-08 LAB — COMPREHENSIVE METABOLIC PANEL WITH GFR
ALT: 35 U/L (ref 0–53)
AST: 27 U/L (ref 0–37)
Albumin: 4.6 g/dL (ref 3.5–5.2)
Alkaline Phosphatase: 117 U/L (ref 39–117)
BUN: 20 mg/dL (ref 6–23)
CO2: 27 meq/L (ref 19–32)
Calcium: 9.5 mg/dL (ref 8.4–10.5)
Chloride: 101 meq/L (ref 96–112)
Creatinine, Ser: 1.02 mg/dL (ref 0.40–1.50)
GFR: 70.98 mL/min (ref >60.00)
Glucose, Bld: 102 mg/dL — ABNORMAL HIGH (ref 70–99)
Potassium: 4 meq/L (ref 3.5–5.1)
Sodium: 141 meq/L (ref 135–145)
Total Bilirubin: 1.4 mg/dL — ABNORMAL HIGH (ref 0.2–1.2)
Total Protein: 7.3 g/dL (ref 6.0–8.3)

## 2024-03-08 LAB — URINALYSIS
Bilirubin Urine: NEGATIVE
Ketones, ur: NEGATIVE
Leukocytes,Ua: NEGATIVE
Nitrite: NEGATIVE
Specific Gravity, Urine: 1.02 (ref 1.000–1.030)
Total Protein, Urine: NEGATIVE
Urine Glucose: >=1000 — AB
Urobilinogen, UA: 1 (ref 0.0–1.0)
pH: 6 (ref 5.0–8.0)

## 2024-03-08 LAB — LIPID PANEL
Cholesterol: 133 mg/dL (ref 0–200)
HDL: 58.8 mg/dL (ref >39.00)
LDL Cholesterol: 55 mg/dL (ref 0–99)
NonHDL: 74.08
Total CHOL/HDL Ratio: 2
Triglycerides: 93 mg/dL (ref 0.0–149.0)
VLDL: 18.6 mg/dL (ref 0.0–40.0)

## 2024-03-08 LAB — HEMOGLOBIN A1C: Hgb A1c MFr Bld: 6.8 % — ABNORMAL HIGH (ref 4.6–6.5)

## 2024-03-08 LAB — TSH: TSH: 2.14 u[IU]/mL (ref 0.35–5.50)

## 2024-03-08 LAB — PSA: PSA: 2.73 ng/mL (ref 0.10–4.00)

## 2024-03-08 NOTE — Assessment & Plan Note (Signed)
 You make sure you have your watch out every morning

## 2024-03-08 NOTE — Assessment & Plan Note (Signed)
 Cont on Diovan-HCT at lower dose

## 2024-03-08 NOTE — Assessment & Plan Note (Signed)
 On CPAP. ?

## 2024-03-08 NOTE — Assessment & Plan Note (Signed)
 Continue on Lipitor  Lipitor side effects were discussed previously and probably not contributing much and he is arthralgias.

## 2024-03-08 NOTE — Assessment & Plan Note (Addendum)
 On Farxiga  Check A1c

## 2024-03-08 NOTE — Progress Notes (Signed)
 She can always she can call me and I will pick it up on my phone but molecular  Subjective:  Patient ID: William LELON Claudene Mickey., male    DOB: 05-Dec-1945  Age: 78 y.o. MRN: [id number]  CC: Follow-up (Patient states nothing to really discuss. )   HPI William W Claudene Mickey. presents for OSA, CAD, A fib  Outpatient Medications Prior to Visit  Medication Sig Dispense Refill   apixaban  (ELIQUIS ) 5 MG TABS tablet TAKE 1 TABLET BY MOUTH TWICE  DAILY 180 tablet 1   atorvastatin  (LIPITOR) 80 MG tablet TAKE 1 TABLET BY MOUTH ONCE  DAILY 90 tablet 3   Cholecalciferol (VITAMIN D3) 50 MCG (2000 UT) capsule Take 1 capsule (2,000 Units total) by mouth daily. 100 capsule 3   ezetimibe  (ZETIA ) 10 MG tablet TAKE 1 TABLET BY MOUTH DAILY 90 tablet 3   FARXIGA  10 MG TABS tablet TAKE 1 TABLET BY MOUTH DAILY  BEFORE BREAKFAST 90 tablet 3   metoprolol  tartrate (LOPRESSOR ) 25 MG tablet TAKE 1 TABLET BY MOUTH 3 TIMES  DAILY AS NEEDED FOR RAPID RATES  WITH AFIB 180 tablet 5   potassium chloride  SA (KLOR-CON  M) 20 MEQ tablet TAKE 1 TABLET BY MOUTH DAILY 90 tablet 3   tamsulosin (FLOMAX) 0.4 MG CAPS capsule Take 0.4 mg by mouth.     triamcinolone cream (KENALOG) 0.1 % APPLY TO AFFECTED AREA TWICE A DAY     valsartan -hydrochlorothiazide  (DIOVAN -HCT) 80-12.5 MG tablet Take 1 tablet by mouth daily. 90 tablet 3   clotrimazole -betamethasone  (LOTRISONE ) cream APPLY TO AFFECTED AREA EVERY DAY AS NEEDED (Patient not taking: Reported on 03/08/2024) 45 g 1   No facility-administered medications prior to visit.    ROS: Review of Systems  Constitutional:  Negative for appetite change, fatigue and unexpected weight change.  HENT:  Negative for congestion, nosebleeds, sneezing, sore throat and trouble swallowing.   Eyes:  Negative for itching and visual disturbance.  Respiratory:  Negative for cough.   Cardiovascular:  Negative for chest pain, palpitations and leg swelling.  Gastrointestinal:  Negative for abdominal distention, blood  in stool, diarrhea and nausea.  Genitourinary:  Negative for frequency and hematuria.  Musculoskeletal:  Negative for back pain, gait problem, joint swelling and neck pain.  Skin:  Negative for rash.  Neurological:  Negative for dizziness, tremors, speech difficulty and weakness.  Psychiatric/Behavioral:  Negative for agitation, dysphoric mood, sleep disturbance and suicidal ideas. The patient is not nervous/anxious.     Objective:  BP 122/84   Pulse 92   Temp 97.7 F (36.5 C) (Oral)   Ht 5' 6 (1.676 m)   Wt 182 lb 3.2 oz (82.6 kg)   SpO2 90%   BMI 29.41 kg/m   BP Readings from Last 3 Encounters:  03/08/24 122/84  12/13/23 100/78  09/13/23 130/80    Wt Readings from Last 3 Encounters:  03/08/24 182 lb 3.2 oz (82.6 kg)  12/13/23 179 lb 3.2 oz (81.3 kg)  09/13/23 189 lb (85.7 kg)    Physical Exam Constitutional:      General: He is not in acute distress.    Appearance: He is well-developed. He is obese.     Comments: NAD  Eyes:     Conjunctiva/sclera: Conjunctivae normal.     Pupils: Pupils are equal, round, and reactive to light.  Neck:     Thyroid : No thyromegaly.     Vascular: No JVD.  Cardiovascular:     Rate and Rhythm: Normal rate and regular  rhythm.     Heart sounds: Normal heart sounds. No murmur heard.    No friction rub. No gallop.  Pulmonary:     Effort: Pulmonary effort is normal. No respiratory distress.     Breath sounds: Normal breath sounds. No wheezing or rales.  Chest:     Chest wall: No tenderness.  Abdominal:     General: Bowel sounds are normal. There is no distension.     Palpations: Abdomen is soft. There is no mass.     Tenderness: There is no abdominal tenderness. There is no guarding or rebound.  Musculoskeletal:        General: No tenderness. Normal range of motion.     Cervical back: Normal range of motion.  Lymphadenopathy:     Cervical: No cervical adenopathy.  Skin:    General: Skin is warm and dry.     Findings: No rash.   Neurological:     Mental Status: He is alert and oriented to person, place, and time.     Cranial Nerves: No cranial nerve deficit.     Motor: No weakness or abnormal muscle tone.     Coordination: Coordination normal.     Gait: Gait normal.     Deep Tendon Reflexes: Reflexes are normal and symmetric.  Psychiatric:        Behavior: Behavior normal.        Thought Content: Thought content normal.        Judgment: Judgment normal.     Lab Results  Component Value Date   WBC 4.8 08/12/2023   HGB 17.5 (H) 08/12/2023   HCT 52.0 08/12/2023   PLT 140.0 (L) 08/12/2023   GLUCOSE 110 (H) 08/12/2023   CHOL 155 05/16/2023   TRIG 138.0 05/16/2023   HDL 56.70 05/16/2023   LDLCALC 71 05/16/2023   ALT 49 08/12/2023   AST 35 08/12/2023   NA 141 08/12/2023   K 3.6 08/12/2023   CL 104 08/12/2023   CREATININE 1.00 08/12/2023   BUN 22 08/12/2023   CO2 29 08/12/2023   TSH 2.24 05/16/2023   PSA 3.20 05/16/2023   HGBA1C 6.7 (H) 08/12/2023    CT CORONARY MORPH W/CTA COR W/SCORE W/CA W/CM &/OR WO/CM Addendum Date: 10/24/2017 ADDENDUM REPORT: 10/24/2017 17:04 CLINICAL DATA:  77 -year-old male with PAF and abnormal exercise treadmill stress test. EXAM: Cardiac/Coronary  CT TECHNIQUE: The patient was scanned on a [employer]. FINDINGS: A 120 kV prospective scan was triggered in the descending thoracic aorta at 111 HU's. Axial non-contrast 3 mm slices were carried out through the heart. The data set was analyzed on a dedicated work station and scored using the Agatson method. Gantry rotation speed was 250 msecs and collimation was .6 mm. 5 mg of iv Metoprolol  and 0.8 mg of sl NTG was given. The 3D data set was reconstructed in 5% intervals of the 67-82 % of the R-R cycle. Diastolic phases were analyzed on a dedicated work station using MPR, MIP and VRT modes. The patient received 80 cc of contrast. Aorta: Normal size. Mild calcifications in the descending aorta. No dissection. Aortic Valve:   Trileaflet.  No calcifications. Coronary Arteries:  Normal coronary origin.  Right dominance. RCA is a large dominant artery that gives rise to PDA and PLVB. There is minimal calcified plaque with associated stenosis 0-25%. Left main is a large artery that gives rise to LAD and LCX arteries. Distal left main has a mild calcified plaque with stenosis 0-25%. LAD is  a large vessel that gives rise to two small diagonal arteries. There is mild mixed diffuse plaque with stenosis 0-25%. 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 LCX is a medium size non-dominant artery. Ostial LCX artery has mild calcified plaque with associated stenosis 25-50%. Mid LCX artery has mixed plaque with stenosis 50-69%. Other findings: Normal pulmonary vein drainage into the left atrium. Normal let atrial appendage without a thrombus. Normal size of the pulmonary artery. IMPRESSION: 1. Coronary calcium  score of 262. This was 25 percentile for age and sex matched control. 2. Normal coronary origin with right dominance. 3. 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 Mid LCX artery has mixed plaque with stenosis 50-69%. Additional analysis with CT FFR will be submitted. 4. 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 Electronically Signed   By: Leim Moose   On: 10/24/2017 17:04   Result Date: 10/24/2017 EXAM: OVER-READ INTERPRETATION  CT CHEST The following report is an over-read performed by radiologist Dr. Franky Crease of [facility] on 10/24/2017. This over-read does not include interpretation of cardiac or coronary anatomy or pathology. The coronary CTA interpretation by the cardiologist is attached. COMPARISON:  None. FINDINGS: Vascular: Heart is normal size.  Visualized aorta normal caliber. Mediastinum/Nodes: No adenopathy in the lower mediastinum or hila. Lungs/Pleura: Dependent atelectasis in the lower lobes. No effusions. Upper Abdomen: Imaging into the upper  abdomen shows no acute findings. Musculoskeletal: Chest wall soft tissues are unremarkable. No acute bony abnormality. IMPRESSION: No acute or significant extracardiac abnormality. Electronically Signed: By: Franky Crease M.D. On: 10/24/2017 14:11    Assessment & Plan:   Problem List Items Addressed This Visit     DM (diabetes mellitus), type 2 (HCC) - Primary   You make sure you have your watch out every morning      Dyslipidemia   Continue on Lipitor  Lipitor side effects were discussed previously and probably not contributing much and he is arthralgias.      Essential hypertension   Cont on Diovan -HCT at lower dose      Hyperglycemia   On Farxiga  Check A1c      OSA on CPAP   On CPAP      Polycythemia   Well adult exam      No orders of the defined types were placed in this encounter.     Follow-up: Return in about 6 months (around 09/05/2024) for a follow-up visit.  William Noel, MD

## 2024-03-12 ENCOUNTER — Other Ambulatory Visit: Payer: Self-pay | Admitting: Cardiovascular Disease

## 2024-03-12 DIAGNOSIS — I480 Paroxysmal atrial fibrillation: Secondary | ICD-10-CM

## 2024-03-13 ENCOUNTER — Ambulatory Visit: Payer: Self-pay | Admitting: Internal Medicine

## 2024-03-13 NOTE — Telephone Encounter (Signed)
 Prescription refill request for Eliquis  received. Indication:afib Last office visit:10/24 Scr:1.02  9/25 Age: 78 Weight:82.6  kg  Prescription refilled

## 2024-03-14 ENCOUNTER — Encounter: Payer: Self-pay | Admitting: Primary Care

## 2024-03-14 ENCOUNTER — Ambulatory Visit (INDEPENDENT_AMBULATORY_CARE_PROVIDER_SITE_OTHER): Admitting: Primary Care

## 2024-03-14 VITALS — BP 128/78 | HR 115 | Ht 66.0 in | Wt 186.0 lb

## 2024-03-14 DIAGNOSIS — G4733 Obstructive sleep apnea (adult) (pediatric): Secondary | ICD-10-CM

## 2024-03-14 NOTE — Patient Instructions (Addendum)
  VISIT SUMMARY: Talyn Dessert., a 78 year old male with severe sleep apnea, visited today to discuss obtaining a new CPAP machine. He has been using a CPAP machine for approximately 16 years and is compliant with his therapy. His current machine is 66 to 77 years old, and he continues to benefit from its use.  YOUR PLAN: -OBSTRUCTIVE SLEEP APNEA: Obstructive sleep apnea is a condition where the airway becomes blocked during sleep, causing breathing pauses. Your condition is well-controlled with your current CPAP therapy, showing excellent control with an AHI of 0.5 events per hour. We will order a new CPAP machine for you and ensure it is linked to [facility] for compliance monitoring. Your sleep study will be on file with the medical supply store.  INSTRUCTIONS: We will order a new CPAP machine for you. Please ensure that your sleep study is on file with the medical supply store. Your new CPAP machine will be linked to [employer] for compliance monitoring.   Follow up  6-8 weeks virtual with Beth for CPAP compliance

## 2024-03-14 NOTE — Progress Notes (Signed)
 @Patient  ID: William [name]., male    DOB: 04-09-1946, 78 y.o.   MRN: [id number]  Chief Complaint  Patient presents with   Establish Care   Obstructive Sleep Apnea    Last seen 2022 VS; on CPAP    Referring provider: Plotnikov, Karlynn GAILS, MD  HPI: 78 year old male, never smoked. PMH significant for OSA, GERD, HTN, HLD, afib. Former patient of Dr. Shellia, last seen in November 2022. PSG 10/04/07 >> AHI 36/ DME is [facility].   03/14/2024- Interim hx Discussed the use of AI scribe software for clinical note transcription with the patient, who gave verbal consent to proceed. History of Present Illness William W Shiven Junious. is a 78 year old male with severe sleep apnea who presents for a new CPAP machine.  He has been using a CPAP machine for approximately 16 years, with his current machine being 57 to 77 years old. He uses the CPAP machine every night and is compliant with therapy, using it 100% of the time for more than four hours per night. His current machine is a ResMed, and he is linked to [employer] for monitoring.  His initial sleep study was conducted in 2009, diagnosing him with severe sleep apnea, with an apnea-hypopnea index (AHI) of 36 events per hour. He has not experienced significant weight changes, with a slight decrease of 5 to 6 pounds, and continues to notice benefits from using the CPAP machine.  He has a history of appendicitis surgery 8 years ago in [place]. He traveled extensively for work, taking his CPAP machine to various international locations.  Airview download 02/13/24-03/13/24 Usage days 30/30 days (100%) Average usage 8 hours 9 mins Pressure 12cm h20 Airleaks 19.6L/min AHI 0.5  Allergies  Allergen Reactions   Benazepril Hcl Other (See Comments)    REACTION: cough    Immunization History  Administered Date(s) Administered   Fluad Quad(high Dose 65+) 03/16/2019, 04/24/2020, 05/11/2022   Fluad Trivalent(High Dose 65+) 05/16/2023   Hepatitis A 05/11/2006    INFLUENZA, HIGH DOSE SEASONAL PF 05/12/2017, 05/17/2018   Influenza Split 05/17/2011, 05/17/2012   Influenza Whole 05/11/2006, 05/23/2007, 05/14/2008, 05/19/2009, 05/14/2010   Influenza,inj,Quad PF,6+ Mos 06/07/2013, 05/05/2016   Influenza-Unspecified 04/21/2015   Moderna Covid-19 Vaccine Bivalent Booster 50yrs & up 04/16/2021   PFIZER(Purple Top)SARS-COV-2 Vaccination 07/24/2019, 08/14/2019, 04/08/2020   Pneumococcal Conjugate-13 05/17/2008, 10/23/2020   Pneumococcal Polysaccharide-23 11/04/2015   Tdap 06/04/2011   Zoster, Live 12/05/2012    Past Medical History:  Diagnosis Date   Atrial fibrillation (HCC)    Colon polyps    Dr Jakie   Diabetes [facility])    GERD (gastroesophageal reflux disease)    HTN (hypertension)    Hyperlipidemia    OSA on CPAP    Prostatitis     Tobacco History: Social History   Tobacco Use  Smoking Status Never  Smokeless Tobacco Never   Counseling given: Not Answered   Outpatient Medications Prior to Visit  Medication Sig Dispense Refill   atorvastatin  (LIPITOR) 80 MG tablet TAKE 1 TABLET BY MOUTH ONCE  DAILY 90 tablet 3   Cholecalciferol (VITAMIN D3) 50 MCG (2000 UT) capsule Take 1 capsule (2,000 Units total) by mouth daily. 100 capsule 3   ELIQUIS  5 MG TABS tablet TAKE 1 TABLET BY MOUTH TWICE  DAILY 180 tablet 3   ezetimibe  (ZETIA ) 10 MG tablet TAKE 1 TABLET BY MOUTH DAILY 90 tablet 3   FARXIGA  10 MG TABS tablet TAKE 1 TABLET BY MOUTH DAILY  BEFORE  BREAKFAST 90 tablet 3   metoprolol  tartrate (LOPRESSOR ) 25 MG tablet TAKE 1 TABLET BY MOUTH 3 TIMES  DAILY AS NEEDED FOR RAPID RATES  WITH AFIB 180 tablet 5   potassium chloride  SA (KLOR-CON  M) 20 MEQ tablet TAKE 1 TABLET BY MOUTH DAILY 90 tablet 3   tamsulosin (FLOMAX) 0.4 MG CAPS capsule Take 0.4 mg by mouth.     triamcinolone cream (KENALOG) 0.1 % APPLY TO AFFECTED AREA TWICE A DAY     valsartan -hydrochlorothiazide  (DIOVAN -HCT) 80-12.5 MG tablet Take 1 tablet by mouth daily. 90 tablet 3    clotrimazole -betamethasone  (LOTRISONE ) cream APPLY TO AFFECTED AREA EVERY DAY AS NEEDED (Patient not taking: Reported on 03/14/2024) 45 g 1   No facility-administered medications prior to visit.   Review of Systems  Review of Systems  Constitutional: Negative.   Respiratory: Negative.       Physical Exam  BP 128/78   Pulse (!) 115   Ht 5' 6 (1.676 m)   Wt 186 lb (84.4 kg)   SpO2 96%   BMI 30.02 kg/m  Physical Exam Constitutional:      Appearance: Normal appearance. He is well-developed.  HENT:     Head: Normocephalic and atraumatic.     Mouth/Throat:     Mouth: Mucous membranes are moist.     Pharynx: Oropharynx is clear.  Cardiovascular:     Rate and Rhythm: Normal rate and regular rhythm.     Heart sounds: Normal heart sounds.  Pulmonary:     Effort: Pulmonary effort is normal. No respiratory distress.     Breath sounds: Normal breath sounds. No wheezing or rhonchi.  Musculoskeletal:        General: Normal range of motion.     Cervical back: Normal range of motion and neck supple.  Skin:    General: Skin is warm and dry.     Findings: No erythema or rash.  Neurological:     General: No focal deficit present.     Mental Status: He is alert and oriented to person, place, and time. Mental status is at baseline.  Psychiatric:        Mood and Affect: Mood normal.        Behavior: Behavior normal.        Thought Content: Thought content normal.        Judgment: Judgment normal.     Lab Results:  CBC    Component Value Date/Time   WBC 4.8 03/08/2024 1007   RBC 5.87 (H) 03/08/2024 1007   HGB 17.7 (H) 03/08/2024 1007   HCT 53.2 (H) 03/08/2024 1007   PLT 152.0 03/08/2024 1007   MCV 90.7 03/08/2024 1007   MCHC 33.3 03/08/2024 1007   RDW 14.6 03/08/2024 1007   LYMPHSABS 1.4 03/08/2024 1007   MONOABS 0.4 03/08/2024 1007   EOSABS 0.2 03/08/2024 1007   BASOSABS 0.0 03/08/2024 1007    BMET    Component Value Date/Time   NA 141 03/08/2024 1007   NA 143  10/03/2017 1003   K 4.0 03/08/2024 1007   CL 101 03/08/2024 1007   CO2 27 03/08/2024 1007   GLUCOSE 102 (H) 03/08/2024 1007   GLUCOSE 109 (H) 05/03/2006 0753   BUN 20 03/08/2024 1007   BUN 20 10/03/2017 1003   CREATININE 1.02 03/08/2024 1007   CALCIUM  9.5 03/08/2024 1007   GFRNONAA 72 10/03/2017 1003   GFRAA 84 10/03/2017 1003    BNP No results found for: BNP  ProBNP No results found  for: PROBNP  Imaging: No results found.   Assessment & Plan:   1. OSA (obstructive sleep apnea) (Primary) - Ambulatory Referral for DME  Assessment and Plan Assessment & Plan Obstructive sleep apnea Obstructive sleep apnea is well-controlled with CPAP therapy. PSG 10/04/07 >> AHI 36/ DME is [facility]. Current pressure 12cm h20; AHI is 0.5 events per hour, indicating excellent control. He has been using CPAP consistently every night for many years, with the current machine being approximately 28-77 years old. No significant weight changes or surgical interventions that would necessitate a repeat sleep study. Patient reports benefit from therapy.  - Order new CPAP machine at 12cm h20 with mask of choice  - Link CPAP machine to [facility] for compliance monitoring - Advised patient continue to wear CPAP nightly 4-6 hours - FU in 31-90 days for CPAP compliance after getting replacement machine     William LELON Ferrari, NP 03/14/2024

## 2024-03-15 ENCOUNTER — Telehealth: Payer: Self-pay | Admitting: Radiology

## 2024-03-15 NOTE — Telephone Encounter (Signed)
 Copied from CRM [id number]. Topic: Clinical - Order For Equipment >> Mar 15, 2024 11:37 AM Viola F wrote: Reason for CRM: Rosina from [employer] called requesting a updated benefit note for patient cpap machine. Please fax to [phone]. Her call back number is [phone].

## 2024-03-16 NOTE — Telephone Encounter (Signed)
 Patient has a pulmonologist who handles this. I called back and gave [facility] the [facility] number to call and get this from.   Curtistine Quiet, CMA

## 2024-04-09 ENCOUNTER — Other Ambulatory Visit: Payer: Self-pay | Admitting: Cardiovascular Disease

## 2024-04-09 ENCOUNTER — Other Ambulatory Visit: Payer: Self-pay | Admitting: Internal Medicine

## 2024-05-04 ENCOUNTER — Encounter: Payer: Self-pay | Admitting: Cardiovascular Disease

## 2024-05-04 ENCOUNTER — Ambulatory Visit: Attending: Cardiovascular Disease | Admitting: Cardiovascular Disease

## 2024-05-04 ENCOUNTER — Ambulatory Visit

## 2024-05-04 VITALS — BP 126/86 | HR 102 | Ht 66.0 in | Wt 185.0 lb

## 2024-05-04 DIAGNOSIS — I480 Paroxysmal atrial fibrillation: Secondary | ICD-10-CM | POA: Diagnosis not present

## 2024-05-04 DIAGNOSIS — I2510 Atherosclerotic heart disease of native coronary artery without angina pectoris: Secondary | ICD-10-CM

## 2024-05-04 DIAGNOSIS — D6869 Other thrombophilia: Secondary | ICD-10-CM | POA: Diagnosis not present

## 2024-05-04 DIAGNOSIS — I10 Essential (primary) hypertension: Secondary | ICD-10-CM | POA: Diagnosis not present

## 2024-05-04 DIAGNOSIS — E785 Hyperlipidemia, unspecified: Secondary | ICD-10-CM

## 2024-05-04 NOTE — Patient Instructions (Signed)
 Medication Instructions:  No changes *If you need a refill on your cardiac medications before your next appointment, please call your pharmacy*  Lab Work: None ordered If you have labs (blood work) drawn today and your tests are completely normal, you will receive your results only by: MyChart Message (if you have MyChart) OR A paper copy in the mail If you have any lab test that is abnormal or we need to change your treatment, we will call you to review the results.  Testing/Procedures: Your physician has requested that you have an echocardiogram. Echocardiography is a painless test that uses sound waves to create images of your heart. It provides your doctor with information about the size and shape of your heart and how well your heart's chambers and valves are working. This procedure takes approximately one hour. There are no restrictions for this procedure. Please do NOT wear cologne, perfume, aftershave, or lotions (deodorant is allowed). Please arrive 15 minutes prior to your appointment time.  Please note: We ask at that you not bring children with you during ultrasound (echo/ vascular) testing. Due to room size and safety concerns, children are not allowed in the ultrasound rooms during exams. Our front office staff cannot provide observation of children in our lobby area while testing is being conducted. An adult accompanying a patient to their appointment will only be allowed in the ultrasound room at the discretion of the ultrasound technician under special circumstances. We apologize for any inconvenience.   Your physician has recommended that you wear a 14 DAY ZIO-PATCH monitor. The Zio patch cardiac monitor continuously records heart rhythm data for up to 14 days, this is for patients being evaluated for multiple types heart rhythms. For the first 24 hours post application, please avoid getting the Zio monitor wet in the shower or by excessive sweating during exercise. After that,  feel free to carry on with regular activities. Keep soaps and lotions away from the ZIO XT Patch.  This will be mailed to you, please expect 7-10 days to receive.    Applying the monitor   Shave hair from upper left chest.   Hold abrader disc by orange tab.  Rub abrader in 40 strokes over left upper chest as indicated in your monitor instructions.   Clean area with 4 enclosed alcohol pads .  Use all pads to assure are is cleaned thoroughly.  Let dry.   Apply patch as indicated in monitor instructions.  Patch will be place under collarbone on left side of chest with arrow pointing upward.   Rub patch adhesive wings for 2 minutes.Remove white label marked 1.  Remove white label marked 2.  Rub patch adhesive wings for 2 additional minutes.   While looking in a mirror, press and release button in center of patch.  A small green light will flash 3-4 times .  This will be your only indicator the monitor has been turned on.     Do not shower for the first 24 hours.  You may shower after the first 24 hours.   Press button if you feel a symptom. You will hear a small click.  Record Date, Time and Symptom in the Patient Log Book.   When you are ready to remove patch, follow instructions on last 2 pages of Patient Log Book.  Stick patch monitor onto last page of Patient Log Book.   Place Patient Log Book in [place] box.  Use locking tab on box and tape box closed securely.  The [employer] has [employer] on it.  Please place in mailbox as soon as possible.  Your physician should have your test results approximately 7 days after the monitor has been mailed back to [facility].   Call [facility] Customer Care at [phone] if you have questions regarding your ZIO XT patch monitor.  Call them immediately if you see an orange light blinking on your monitor.   If your monitor falls off in less than 4 days contact our Monitor department at [phone].  If your monitor becomes  loose or falls off after 4 days call [employer] at [phone] for suggestions on securing your monitor   Follow-Up: At [facility], you and your health needs are our priority.  As part of our continuing mission to provide you with exceptional heart care, our providers are all part of one team.  This team includes your primary Cardiologist (physician) and Advanced Practice Providers or APPs (Physician Assistants and Nurse Practitioners) who all work together to provide you with the care you need, when you need it.  Your next appointment:   1 year(s)  Provider:   Jerel Balding, MD    We recommend signing up for the patient portal called MyChart.  Sign up information is provided on this After Visit Summary.  MyChart is used to connect with patients for Virtual Visits (Telemedicine).  Patients are able to view lab/test results, encounter notes, upcoming appointments, etc.  Non-urgent messages can be sent to your provider as well.   To learn more about what you can do with MyChart, go to [email or web address].

## 2024-05-04 NOTE — Progress Notes (Unsigned)
 Enrolled patient for a 14 day Zio XT  monitor to be mailed to patients home

## 2024-05-04 NOTE — Progress Notes (Signed)
 Patient ID: William [name]., male   DOB: 1946/05/01, 78 y.o.   MRN: [id number]     Cardiology Office Note    Date:  05/04/2024   ID:  William [name]., DOB 03-14-1946, MRN [id number]  PCP:  William Karlynn GAILS, MD  Cardiologist:   William Balding, MD   Chief Complaint  Patient presents with   Atrial Fibrillation    History of Present Illness:  William [name]. is a 78 y.o. male with paroxysmal atrial fibrillation, diabetes mellitus type 2 without complications, hypertension, hypercholesterolemia on statin, moderate nonobstructive CAD, returning for routine follow-up.   He feels great.  He has no awareness of arrhythmia.  He is able to perform intense physical activity without limitations.  A large tree fell in his backyard and he was able to use a chainsaw and split it himself with just some assistance from his nephew.  He did not have trouble with chest pain, dizziness or shortness of breath with this activity.  He denies lower extremity edema, orthopnea, PND.  He has not had any falls or bleeding problems and is compliant with anticoagulation.  He denies any focal neurological events.  At his last office appointment a year ago he was in atrial fibrillation with borderline RVR at about 100 bpm.  He is in a similar rhythm today.  He reports that in the last 6 months his heart rates have generally been in the high 80s at rest, which he associates with probable atrial fibrillation, since otherwise his heart rate used to be in the 60s.  During physical exertion his smart watch tells him that his heart rate is 130-1 140s, but promptly returns to baseline after 10-15 minutes of rest.  Ever since his episode of COVID-19 infection a couple of years ago his blood pressure has been easier to control and he is taking half the previous amount of antihypertensive medication.  Metabolic control is good with a hemoglobin A1c of 6.8%, LDL 55 and HDL 58, normal triglycerides on treatment with Farxiga ,  atorvastatin , ezetimibe .  In February 2019, he had asymptomatic changes on his electrocardiogram (ST changes and QTC prolongation) and his treadmill stress test showed evidence of significant ST segment depression with exercise.  This was followed up with a coronary CT angiogram.  The calcium  score was 262 (61st percentile) but the only significant abnormality was a 50-69% stenosis in the mid left circumflex coronary artery.  Incidental note was made of a small distal LAD intramyocardial bridge.  Medical therapy was recommended since he was asymptomatic.  The patient specifically denies any chest pain at rest exertion, dyspnea at rest or with exertion, orthopnea, paroxysmal nocturnal dyspnea, syncope, palpitations, focal neurological deficits, intermittent claudication, lower extremity edema, unexplained weight gain, cough, hemoptysis or wheezing.   Most recent labs showed an excellent LDL cholesterol at 62, HDL 58 and satisfactory glycemic control with a hemoglobin A1c of 6.8%.  Treatment with beta blockers in the past led to a symptomatic junctional escape rhythm and he is currently not on any rate control medications. His blood pressure is well controlled and he takes a statin for hyperlipidemia.  When his dose of statin was reduced for transient abnormality in the liver test, his LDL increased to 126.  He is now back on the full dose of statin.   Past Medical History:  Diagnosis Date   Atrial fibrillation [facility])    Colon polyps    William William [name]   Diabetes William [name])  GERD (gastroesophageal reflux disease)    HTN (hypertension)    Hyperlipidemia    OSA on CPAP    Prostatitis     Past Surgical History:  Procedure Laterality Date   APPENDECTOMY  2012   09/2010 in [place]   HERNIA REPAIR  1975   ROTATOR CUFF REPAIR  2008   left; William [name]    Outpatient Medications Prior to Visit  Medication Sig Dispense Refill   atorvastatin  (LIPITOR) 80 MG tablet TAKE 1 TABLET BY MOUTH ONCE  DAILY 90  tablet 0   Cholecalciferol (VITAMIN D3) 50 MCG (2000 UT) capsule Take 1 capsule (2,000 Units total) by mouth daily. 100 capsule 3   clotrimazole -betamethasone  (LOTRISONE ) cream APPLY TO AFFECTED AREA EVERY DAY AS NEEDED 45 g 1   ELIQUIS  5 MG TABS tablet TAKE 1 TABLET BY MOUTH TWICE  DAILY 180 tablet 3   ezetimibe  (ZETIA ) 10 MG tablet TAKE 1 TABLET BY MOUTH DAILY 90 tablet 3   FARXIGA  10 MG TABS tablet TAKE 1 TABLET BY MOUTH DAILY  BEFORE BREAKFAST 90 tablet 3   metoprolol  tartrate (LOPRESSOR ) 25 MG tablet TAKE 1 TABLET BY MOUTH 3 TIMES  DAILY AS NEEDED FOR RAPID RATES  WITH AFIB 180 tablet 5   potassium chloride  SA (KLOR-CON  M) 20 MEQ tablet TAKE 1 TABLET BY MOUTH DAILY 90 tablet 3   tamsulosin (FLOMAX) 0.4 MG CAPS capsule Take 0.4 mg by mouth.     triamcinolone cream (KENALOG) 0.1 % APPLY TO AFFECTED AREA TWICE A DAY     valsartan -hydrochlorothiazide  (DIOVAN -HCT) 80-12.5 MG tablet Take 1 tablet by mouth daily. 90 tablet 3   No facility-administered medications prior to visit.     Allergies:   Benazepril hcl   Social History   Socioeconomic History   Marital status: Married    Spouse name: William [name]   Number of children: 3   Years of education: college   Highest education level: Bachelor's degree (e.g., BA, AB, BS)  Occupational History   Occupation: Accountant/Retired  Tobacco Use   Smoking status: Never   Smokeless tobacco: Never  Vaping Use   Vaping status: Never Used  Substance and Sexual Activity   Alcohol use: No   Drug use: No   Sexual activity: Yes  Other Topics Concern   Not on file  Social History Narrative   Regular Exercise - Yes, gym            Social Drivers of Health   Financial Resource Strain: Low Risk  (03/08/2024)   Overall Financial Resource Strain (CARDIA)    Difficulty of Paying Living Expenses: Not hard at all  Food Insecurity: No Food Insecurity (03/08/2024)   Hunger Vital Sign    Worried About Running Out of Food in the Last Year: Never true     Ran Out of Food in the Last Year: Never true  Transportation Needs: No Transportation Needs (03/08/2024)   PRAPARE - Administrator, Civil Service (Medical): No    Lack of Transportation (Non-Medical): No  Physical Activity: Sufficiently Active (03/08/2024)   Exercise Vital Sign    Days of Exercise per Week: 5 days    Minutes of Exercise per Session: 60 min  Stress: No Stress Concern Present (03/08/2024)   [employer] of Occupational Health - Occupational Stress Questionnaire    Feeling of Stress: Not at all  Social Connections: Socially Integrated (03/08/2024)   Social Connection and Isolation Panel    Frequency of Communication with Friends and Family:  More than three times a week    Frequency of Social Gatherings with Friends and Family: Twice a week    Attends Religious Services: More than 4 times per year    Active Member of [employer] or Organizations: Yes    Attends Engineer, Structural: More than 4 times per year    Marital Status: Married     Family History:  The patient's family history includes Asthma in his son; Coronary artery disease in his mother; Heart disease (age of onset: 12) in his father and mother; Hypertension in an other family member; Stroke in his sister.   ROS:   Please see the history of present illness.    ROS All other systems are reviewed and are negative  PHYSICAL EXAM:   VS:  BP 126/86 (BP Location: Left Arm, Patient Position: Sitting, Cuff Size: Large)   Pulse (!) 102   Ht 5' 6 (1.676 m)   Wt 185 lb (83.9 kg)   SpO2 96%   BMI 29.86 kg/m       General: Alert, oriented x3, no distress, overweight Head: no evidence of trauma, PERRL, EOMI, no exophtalmos or lid lag, no myxedema, no xanthelasma; normal ears, nose and oropharynx Neck: normal jugular venous pulsations and no hepatojugular reflux; brisk carotid pulses without delay and no carotid bruits Chest: clear to auscultation, no signs of consolidation by percussion or  palpation, normal fremitus, symmetrical and full respiratory excursions Cardiovascular: normal position and quality of the apical impulse, irregular rhythm, normal first and second heart sounds, no murmurs, rubs or gallops Abdomen: no tenderness or distention, no masses by palpation, no abnormal pulsatility or arterial bruits, normal bowel sounds, no hepatosplenomegaly Extremities: no clubbing, cyanosis or edema; 2+ radial, ulnar and brachial pulses bilaterally; 2+ right femoral, posterior tibial and dorsalis pedis pulses; 2+ left femoral, posterior tibial and dorsalis pedis pulses; no subclavian or femoral bruits Neurological: grossly nonfocal Psych: Normal mood and affect     Wt Readings from Last 3 Encounters:  05/04/24 185 lb (83.9 kg)  03/14/24 186 lb (84.4 kg)  03/08/24 182 lb 3.2 oz (82.6 kg)      Studies/Labs Reviewed:   EKG:    EKG Interpretation Date/Time:  Friday May 04 2024 07:56:40 EST Ventricular Rate:  102 PR Interval:    QRS Duration:  118 QT Interval:  362 QTC Calculation: 471 R Axis:   -9  Text Interpretation: Atrial fibrillation with rapid ventricular response with premature ventricular or aberrantly conducted complexes Incomplete right bundle branch block ST & T wave abnormality, consider inferior ischemia ST & T wave abnormality, consider anterior ischemia When compared with ECG of 21-Apr-2023 09:34, No significant change since last tracing Confirmed by Peri Kreft [phone]) on 05/04/2024 8:08:37 AM         Recent Labs: 03/08/2024: ALT 35; BUN 20; Creatinine, Ser 1.02; Hemoglobin 17.7; Platelets 152.0; Potassium 4.0; Sodium 141; TSH 2.14   Lipid Panel    Component Value Date/Time   CHOL 133 03/08/2024 1007   CHOL 146 11/09/2019 0959   TRIG 93.0 03/08/2024 1007   TRIG 67 05/03/2006 0753   HDL 58.80 03/08/2024 1007   HDL 55 11/09/2019 0959   CHOLHDL 2 03/08/2024 1007   VLDL 18.6 03/08/2024 1007   LDLCALC 55 03/08/2024 1007   LDLCALC 73  11/09/2019 0959  01/21/2022 Cholesterol 118, HDL 40, LDL 61, triglycerides 88, hemoglobin A1c 5.7%   CHA2DS2-VASc Score = 5  The patient's score is based upon: CHF History: 0 HTN  History: 1 Diabetes History: 1 Stroke History: 0 Vascular Disease History: 1 Age Score: 2 Gender Score: 0       ASSESSMENT AND PLAN: Paroxysmal Atrial Fibrillation (ICD10:  I48.0) The patient's CHA2DS2-VASc score is 5, indicating a 7.2% annual risk of stroke.    Secondary Hypercoagulable State (ICD10:  D68.69) The patient is at significant risk for stroke/thromboembolism based upon his CHA2DS2-VASc Score of 5.  Continue Apixaban  (Eliquis ).     ASSESSMENT:    1. Paroxysmal atrial fibrillation (HCC)   2. Essential hypertension   3. Acquired thrombophilia   4. Coronary artery disease involving native coronary artery of native heart without angina pectoris   5. Dyslipidemia      PLAN:  In order of problems listed above:  A-fib: It seems that this it may have become a persistent arrhythmia, at least over the last year or so.  He has borderline ventricular rate control based on ECGs in the office.  His smart watch report heart rates in the 80s, but this is likely an underestimation of the true electrical rate.  The arrhythmia is completely asymptomatic.  In the past, when he had paroxysmal atrial fibrillation, treatment with beta-blockers was associated with bradycardia and fatigue when he was in sinus rhythm.  We discussed the evolving understanding of the treatment of persistent atrial fibrillation and the more aggressive attempts to maintain normal rhythm if possible as well as the evolution of A-fib ablation as a safer and more effective tool compared to years past.  Of course, age 70 will have an impact on our decision process.  Will have him wear a 2-week monitor to see if this is indeed persistent atrial fibrillation and to gauge rate control.  Will also repeat an echocardiogram to see if there has  been any impact on left ventricular function, left atrial size or mitral valve insufficiency.  Based on those findings we will again address whether we should treat this as longstanding persistent atrial fibrillation or refer him to an electrophysiologist to discuss ablation. Eliquis : Compliant, no bleeding complications. CAD: Moderate nonobstructive disease by previous angiography, asymptomatic despite a physically active lifestyle. HLP: Lipid parameters are excellent on combination atorvastatin  maximum dose plus ezetimibe  HTN: Well-controlled.  If we have to add a AV node blocking agent we may have to decrease his antihypertensive medications further. DM: Adequate glycemic control on SGLT2 inhibitor.  He is not on insulin. Long QT: Borderline prolonged QTc interval.   Medication Adjustments/Labs and Tests Ordered: Current medicines are reviewed at length with the patient today.  Concerns regarding medicines are outlined above.  Medication changes, Labs and Tests ordered today are listed in the Patient Instructions below. Patient Instructions  Medication Instructions:  No changes *If you need a refill on your cardiac medications before your next appointment, please call your pharmacy*  Lab Work: None ordered If you have labs (blood work) drawn today and your tests are completely normal, you will receive your results only by: MyChart Message (if you have MyChart) OR A paper copy in the mail If you have any lab test that is abnormal or we need to change your treatment, we will call you to review the results.  Testing/Procedures: Your physician has requested that you have an echocardiogram. Echocardiography is a painless test that uses sound waves to create images of your heart. It provides your doctor with information about the size and shape of your heart and how well your heart's chambers and valves are working. This procedure takes approximately  one hour. There are no restrictions for this  procedure. Please do NOT wear cologne, perfume, aftershave, or lotions (deodorant is allowed). Please arrive 15 minutes prior to your appointment time.  Please note: We ask at that you not bring children with you during ultrasound (echo/ vascular) testing. Due to room size and safety concerns, children are not allowed in the ultrasound rooms during exams. Our front office staff cannot provide observation of children in our lobby area while testing is being conducted. An adult accompanying a patient to their appointment will only be allowed in the ultrasound room at the discretion of the ultrasound technician under special circumstances. We apologize for any inconvenience.   Your physician has recommended that you wear a 14 DAY ZIO-PATCH monitor. The Zio patch cardiac monitor continuously records heart rhythm data for up to 14 days, this is for patients being evaluated for multiple types heart rhythms. For the first 24 hours post application, please avoid getting the Zio monitor wet in the shower or by excessive sweating during exercise. After that, feel free to carry on with regular activities. Keep soaps and lotions away from the ZIO XT Patch.  This will be mailed to you, please expect 7-10 days to receive.    Applying the monitor   Shave hair from upper left chest.   Hold abrader disc by orange tab.  Rub abrader in 40 strokes over left upper chest as indicated in your monitor instructions.   Clean area with 4 enclosed alcohol pads .  Use all pads to assure are is cleaned thoroughly.  Let dry.   Apply patch as indicated in monitor instructions.  Patch will be place under collarbone on left side of chest with arrow pointing upward.   Rub patch adhesive wings for 2 minutes.Remove white label marked 1.  Remove white label marked 2.  Rub patch adhesive wings for 2 additional minutes.   While looking in a mirror, press and release button in [name] of patch.  A small green light will flash 3-4  times .  This will be your only indicator the monitor has been turned on.     Do not shower for the first 24 hours.  You may shower after the first 24 hours.   Press button if you feel a symptom. You will hear a small click.  Record Date, Time and Symptom in the Patient Log Book.   When you are ready to remove patch, follow instructions on last 2 pages of Patient Log Book.  Stick patch monitor onto last page of Patient Log Book.   Place Patient Log Book in [place] box.  Use locking tab on box and tape box closed securely.  The [employer] and [employer] has [employer] on it.  Please place in mailbox as soon as possible.  Your physician should have your test results approximately 7 days after the monitor has been mailed back to [facility].   Call [facility] Customer Care at [phone] if you have questions regarding your ZIO XT patch monitor.  Call them immediately if you see an orange light blinking on your monitor.   If your monitor falls off in less than 4 days contact our Monitor department at [phone].  If your monitor becomes loose or falls off after 4 days call [employer] at [phone] for suggestions on securing your monitor   Follow-Up: At [facility], you and your health needs are our priority.  As part of our continuing mission to provide you with  exceptional heart care, our providers are all part of one team.  This team includes your primary Cardiologist (physician) and Advanced Practice Providers or APPs (Physician Assistants and Nurse Practitioners) who all work together to provide you with the care you need, when you need it.  Your next appointment:   1 year(s)  Provider:   Jerel Balding, MD    We recommend signing up for the patient portal called MyChart.  Sign up information is provided on this After Visit Summary.  MyChart is used to connect with patients for Virtual Visits (Telemedicine).  Patients are able to view lab/test results, encounter notes,  upcoming appointments, etc.  Non-urgent messages can be sent to your provider as well.   To learn more about what you can do with MyChart, go to [email or web address].       Signed, William Balding, MD  05/04/2024 8:54 AM    [facility] [address] [place], [place], [place]  [address] Phone: [phone]; Fax: [phone]

## 2024-05-11 ENCOUNTER — Telehealth (INDEPENDENT_AMBULATORY_CARE_PROVIDER_SITE_OTHER): Admitting: Primary Care

## 2024-05-11 DIAGNOSIS — G4733 Obstructive sleep apnea (adult) (pediatric): Secondary | ICD-10-CM | POA: Diagnosis not present

## 2024-05-11 NOTE — Progress Notes (Signed)
 Virtual Visit via Video Note  I connected with William W Ollinger Jr. on 05/11/24 at  2:00 PM EST by a video enabled telemedicine application and verified that I am speaking with the correct person using two identifiers.  Location: Patient: Home Provider: Office    I discussed the limitations of evaluation and management by telemedicine and the availability of in person appointments. The patient expressed understanding and agreed to proceed.  History of Present Illness: 78 year old male, never smoked. PMH significant for OSA, GERD, HTN, HLD, afib. Former patient of Dr. Shellia, last seen in November 2022. PSG 10/04/07 >> AHI 36/ DME is [facility].   Previous [facility] pulmonary encounter:  03/14/2024 Discussed the use of AI scribe software for clinical note transcription with the patient, who gave verbal consent to proceed. History of Present Illness William [name]. is a 78 year old male with severe sleep apnea who presents for a new CPAP machine.  He has been using a CPAP machine for approximately 16 years, with his current machine being 1 to 77 years old. He uses the CPAP machine every night and is compliant with therapy, using it 100% of the time for more than four hours per night. His current machine is a ResMed, and he is linked to [place] for monitoring.  His initial sleep study was conducted in 2009, diagnosing him with severe sleep apnea, with an apnea-hypopnea index (AHI) of 36 events per hour. He has not experienced significant weight changes, with a slight decrease of 5 to 6 pounds, and continues to notice benefits from using the CPAP machine.  He has a history of appendicitis surgery 8 years ago in [place]. He traveled extensively for work, taking his CPAP machine to various international locations.  Airview download 02/13/24-03/13/24 Usage days 30/30 days (100%) Average usage 8 hours 9 mins Pressure 12cm h20 Airleaks 19.6L/min AHI 0.5  05/11/2024- Interim hx  Discussed the use of AI scribe  software for clinical note transcription with the patient, who gave verbal consent to proceed.  History of Present Illness William [name]. is a 78 year old male with sleep apnea who presents for a compliance check with his new CPAP machine.  He has a history of sleep apnea diagnosed in April 2009, with an average apnea index of 36 events per hour. He recently received a new CPAP machine, which was ordered during his last visit in September.  He uses the new CPAP machine without any issues. His compliance with the machine is 100% from October 12th through November 10th, with an average usage of seven hours and forty-five minutes per night. The machine is set at a pressure of twelve, and his apnea score is 0.1.     Observations/Objective:  Appears well without overt respiratory symptoms  Assessment and Plan:  1. OSA (obstructive sleep apnea) (Primary)  Assessment and Plan Assessment & Plan Obstructive sleep apnea, well controlled on CPAP Obstructive sleep apnea is well controlled with CPAP therapy. Received new CPAP machine in Fall 2025. Compliance is excellent with 100% usage from October 12th through November 10th, averaging 7 hours and 45 minutes per night. Current CPAP pressure is set at 12 cm H2O, and the apnea score is 0.1, indicating effective management of sleep apnea. No issues reported with the new CPAP machine, and he reports improved sleep quality. - Continue current CPAP therapy with pressure set at 12 cm H2O. - Advised to contact the clinic if any issues with CPAP compliance or usage  arise.   Follow Up Instructions:  Fu in 1 year or sooner if needed    I discussed the assessment and treatment plan with the patient. The patient was provided an opportunity to ask questions and all were answered. The patient agreed with the plan and demonstrated an understanding of the instructions.   The patient was advised to call back or seek an in-person evaluation if the symptoms  worsen or if the condition fails to improve as anticipated.  I provided 22 minutes of non-face-to-face time during this encounter.   Almarie LELON Ferrari, NP

## 2024-06-01 ENCOUNTER — Ambulatory Visit: Payer: Self-pay | Admitting: Cardiovascular Disease

## 2024-06-01 DIAGNOSIS — I480 Paroxysmal atrial fibrillation: Secondary | ICD-10-CM | POA: Diagnosis not present

## 2024-06-01 MED ORDER — METOPROLOL SUCCINATE ER 25 MG PO TB24: 25.0000 mg | ORAL_TABLET | Freq: Every day | ORAL | 0 refills | Status: AC

## 2024-06-01 MED ORDER — METOPROLOL SUCCINATE ER 25 MG PO TB24: 25.0000 mg | ORAL_TABLET | Freq: Every day | ORAL | 3 refills | Status: AC

## 2024-06-01 NOTE — Telephone Encounter (Signed)
 Results given over the phone. Will send 30 days worth of Metoprolol  Succinate to [facility], and also [facility] so he can get the rest from there.   He verbalized understanding of all information.

## 2024-06-05 ENCOUNTER — Other Ambulatory Visit: Payer: Self-pay | Admitting: Internal Medicine

## 2024-06-06 ENCOUNTER — Ambulatory Visit (HOSPITAL_COMMUNITY)
Admission: RE | Admit: 2024-06-06 | Discharge: 2024-06-06 | Attending: Cardiovascular Disease | Admitting: Cardiovascular Disease

## 2024-06-06 DIAGNOSIS — I4891 Unspecified atrial fibrillation: Secondary | ICD-10-CM

## 2024-06-06 DIAGNOSIS — I480 Paroxysmal atrial fibrillation: Secondary | ICD-10-CM | POA: Diagnosis present

## 2024-06-06 LAB — ECHOCARDIOGRAM COMPLETE: S' Lateral: 2.7 cm

## 2024-06-14 ENCOUNTER — Other Ambulatory Visit: Payer: Self-pay | Admitting: Cardiovascular Disease

## 2024-07-20 ENCOUNTER — Encounter: Payer: Self-pay | Admitting: Cardiovascular Disease

## 2024-07-20 ENCOUNTER — Telehealth: Payer: Self-pay | Admitting: Cardiovascular Disease

## 2024-07-20 NOTE — Telephone Encounter (Signed)
 Not sure how this left my in basket without reporting to the patient.  I sent him a message.

## 2024-07-20 NOTE — Telephone Encounter (Signed)
Patient calling in about his echo results. Please advise

## 2024-07-20 NOTE — Telephone Encounter (Signed)
 Spoke with pt. Advised it didn't look like that had been resulted and I would get this over to Dr Francyne to review. Pt stated understanding.

## 2024-07-23 ENCOUNTER — Ambulatory Visit: Admitting: Cardiovascular Disease

## 2024-09-05 ENCOUNTER — Ambulatory Visit: Admitting: Internal Medicine

## 2024-09-10 ENCOUNTER — Ambulatory Visit: Admitting: Internal Medicine

## 2024-11-12 ENCOUNTER — Ambulatory Visit: Admitting: Cardiovascular Disease
